# Patient Record
Sex: Male | Born: 1980 | Race: White | Hispanic: No | Smoking: Current every day smoker
Health system: Southern US, Community
[De-identification: ages and names within clinical notes are randomized; demographics above are authoritative.]

## PROBLEM LIST (undated history)

## (undated) DIAGNOSIS — R51 Headache: Secondary | ICD-10-CM

## (undated) DIAGNOSIS — K219 Gastro-esophageal reflux disease without esophagitis: Secondary | ICD-10-CM

## (undated) DIAGNOSIS — I2510 Atherosclerotic heart disease of native coronary artery without angina pectoris: Secondary | ICD-10-CM

## (undated) DIAGNOSIS — I209 Angina pectoris, unspecified: Secondary | ICD-10-CM

## (undated) DIAGNOSIS — M1990 Unspecified osteoarthritis, unspecified site: Secondary | ICD-10-CM

## (undated) DIAGNOSIS — I219 Acute myocardial infarction, unspecified: Secondary | ICD-10-CM

## (undated) DIAGNOSIS — G40909 Epilepsy, unspecified, not intractable, without status epilepticus: Secondary | ICD-10-CM

## (undated) DIAGNOSIS — I10 Essential (primary) hypertension: Secondary | ICD-10-CM

## (undated) DIAGNOSIS — R519 Headache, unspecified: Secondary | ICD-10-CM

## (undated) DIAGNOSIS — J449 Chronic obstructive pulmonary disease, unspecified: Secondary | ICD-10-CM

## (undated) DIAGNOSIS — K029 Dental caries, unspecified: Secondary | ICD-10-CM

## (undated) DIAGNOSIS — E119 Type 2 diabetes mellitus without complications: Secondary | ICD-10-CM

## (undated) DIAGNOSIS — R569 Unspecified convulsions: Secondary | ICD-10-CM

## (undated) HISTORY — PX: CHOLECYSTECTOMY: SHX55

## (undated) HISTORY — PX: FACIAL RECONSTRUCTION SURGERY: SHX631

## (undated) HISTORY — PX: EYE SURGERY: SHX253

---

## 2000-12-18 ENCOUNTER — Emergency Department (HOSPITAL_COMMUNITY): Admission: EM | Admit: 2000-12-18 | Discharge: 2000-12-18 | Payer: Self-pay | Admitting: *Deleted

## 2000-12-18 ENCOUNTER — Encounter: Payer: Self-pay | Admitting: *Deleted

## 2000-12-22 ENCOUNTER — Emergency Department (HOSPITAL_COMMUNITY): Admission: EM | Admit: 2000-12-22 | Discharge: 2000-12-23 | Payer: Self-pay | Admitting: *Deleted

## 2001-03-29 ENCOUNTER — Emergency Department (HOSPITAL_COMMUNITY): Admission: EM | Admit: 2001-03-29 | Discharge: 2001-03-29 | Payer: Self-pay | Admitting: *Deleted

## 2001-03-29 ENCOUNTER — Encounter: Payer: Self-pay | Admitting: *Deleted

## 2001-05-14 ENCOUNTER — Emergency Department (HOSPITAL_COMMUNITY): Admission: EM | Admit: 2001-05-14 | Discharge: 2001-05-14 | Payer: Self-pay | Admitting: *Deleted

## 2002-02-08 ENCOUNTER — Emergency Department (HOSPITAL_COMMUNITY): Admission: EM | Admit: 2002-02-08 | Discharge: 2002-02-08 | Payer: Self-pay | Admitting: *Deleted

## 2002-05-12 ENCOUNTER — Emergency Department (HOSPITAL_COMMUNITY): Admission: EM | Admit: 2002-05-12 | Discharge: 2002-05-12 | Payer: Self-pay | Admitting: *Deleted

## 2002-05-14 ENCOUNTER — Emergency Department (HOSPITAL_COMMUNITY): Admission: EM | Admit: 2002-05-14 | Discharge: 2002-05-14 | Payer: Self-pay | Admitting: Emergency Medicine

## 2002-05-15 ENCOUNTER — Emergency Department (HOSPITAL_COMMUNITY): Admission: EM | Admit: 2002-05-15 | Discharge: 2002-05-15 | Payer: Self-pay | Admitting: *Deleted

## 2003-06-19 ENCOUNTER — Emergency Department (HOSPITAL_COMMUNITY): Admission: EM | Admit: 2003-06-19 | Discharge: 2003-06-20 | Payer: Self-pay | Admitting: Emergency Medicine

## 2003-07-12 ENCOUNTER — Emergency Department (HOSPITAL_COMMUNITY): Admission: EM | Admit: 2003-07-12 | Discharge: 2003-07-13 | Payer: Self-pay | Admitting: Emergency Medicine

## 2003-07-23 ENCOUNTER — Emergency Department (HOSPITAL_COMMUNITY): Admission: EM | Admit: 2003-07-23 | Discharge: 2003-07-23 | Payer: Self-pay | Admitting: Emergency Medicine

## 2003-07-24 ENCOUNTER — Emergency Department (HOSPITAL_COMMUNITY): Admission: EM | Admit: 2003-07-24 | Discharge: 2003-07-24 | Payer: Self-pay | Admitting: Emergency Medicine

## 2003-07-25 ENCOUNTER — Ambulatory Visit (HOSPITAL_COMMUNITY): Admission: RE | Admit: 2003-07-25 | Discharge: 2003-07-25 | Payer: Self-pay | Admitting: Emergency Medicine

## 2003-07-25 ENCOUNTER — Emergency Department (HOSPITAL_COMMUNITY): Admission: EM | Admit: 2003-07-25 | Discharge: 2003-07-25 | Payer: Self-pay | Admitting: Emergency Medicine

## 2003-07-26 ENCOUNTER — Inpatient Hospital Stay (HOSPITAL_COMMUNITY): Admission: AD | Admit: 2003-07-26 | Discharge: 2003-07-29 | Payer: Self-pay | Admitting: General Surgery

## 2003-08-03 ENCOUNTER — Emergency Department (HOSPITAL_COMMUNITY): Admission: EM | Admit: 2003-08-03 | Discharge: 2003-08-03 | Payer: Self-pay | Admitting: *Deleted

## 2003-08-10 ENCOUNTER — Emergency Department (HOSPITAL_COMMUNITY): Admission: EM | Admit: 2003-08-10 | Discharge: 2003-08-11 | Payer: Self-pay

## 2003-08-23 ENCOUNTER — Emergency Department (HOSPITAL_COMMUNITY): Admission: EM | Admit: 2003-08-23 | Discharge: 2003-08-23 | Payer: Self-pay | Admitting: *Deleted

## 2004-09-11 ENCOUNTER — Emergency Department (HOSPITAL_COMMUNITY): Admission: EM | Admit: 2004-09-11 | Discharge: 2004-09-12 | Payer: Self-pay | Admitting: Emergency Medicine

## 2004-09-16 ENCOUNTER — Emergency Department (HOSPITAL_COMMUNITY): Admission: EM | Admit: 2004-09-16 | Discharge: 2004-09-16 | Payer: Self-pay | Admitting: Emergency Medicine

## 2004-11-03 ENCOUNTER — Emergency Department (HOSPITAL_COMMUNITY): Admission: EM | Admit: 2004-11-03 | Discharge: 2004-11-03 | Payer: Self-pay | Admitting: Emergency Medicine

## 2004-11-13 ENCOUNTER — Emergency Department (HOSPITAL_COMMUNITY): Admission: EM | Admit: 2004-11-13 | Discharge: 2004-11-13 | Payer: Self-pay | Admitting: Emergency Medicine

## 2004-11-20 ENCOUNTER — Emergency Department (HOSPITAL_COMMUNITY): Admission: EM | Admit: 2004-11-20 | Discharge: 2004-11-20 | Payer: Self-pay | Admitting: Emergency Medicine

## 2004-12-02 ENCOUNTER — Emergency Department (HOSPITAL_COMMUNITY): Admission: EM | Admit: 2004-12-02 | Discharge: 2004-12-03 | Payer: Self-pay | Admitting: *Deleted

## 2005-01-17 ENCOUNTER — Emergency Department (HOSPITAL_COMMUNITY): Admission: EM | Admit: 2005-01-17 | Discharge: 2005-01-17 | Payer: Self-pay | Admitting: Emergency Medicine

## 2005-04-20 ENCOUNTER — Emergency Department (HOSPITAL_COMMUNITY): Admission: EM | Admit: 2005-04-20 | Discharge: 2005-04-20 | Payer: Self-pay | Admitting: *Deleted

## 2005-09-06 ENCOUNTER — Emergency Department (HOSPITAL_COMMUNITY): Admission: EM | Admit: 2005-09-06 | Discharge: 2005-09-06 | Payer: Self-pay | Admitting: Emergency Medicine

## 2006-02-02 ENCOUNTER — Emergency Department (HOSPITAL_COMMUNITY): Admission: EM | Admit: 2006-02-02 | Discharge: 2006-02-02 | Payer: Self-pay | Admitting: Emergency Medicine

## 2006-03-11 ENCOUNTER — Emergency Department (HOSPITAL_COMMUNITY): Admission: EM | Admit: 2006-03-11 | Discharge: 2006-03-11 | Payer: Self-pay | Admitting: Emergency Medicine

## 2006-04-10 ENCOUNTER — Emergency Department (HOSPITAL_COMMUNITY): Admission: EM | Admit: 2006-04-10 | Discharge: 2006-04-10 | Payer: Self-pay | Admitting: Emergency Medicine

## 2006-04-17 ENCOUNTER — Emergency Department (HOSPITAL_COMMUNITY): Admission: EM | Admit: 2006-04-17 | Discharge: 2006-04-17 | Payer: Self-pay | Admitting: Emergency Medicine

## 2006-06-27 ENCOUNTER — Emergency Department (HOSPITAL_COMMUNITY): Admission: EM | Admit: 2006-06-27 | Discharge: 2006-06-27 | Payer: Self-pay | Admitting: Emergency Medicine

## 2006-12-16 ENCOUNTER — Emergency Department (HOSPITAL_COMMUNITY): Admission: EM | Admit: 2006-12-16 | Discharge: 2006-12-16 | Payer: Self-pay | Admitting: Emergency Medicine

## 2007-07-02 ENCOUNTER — Emergency Department (HOSPITAL_COMMUNITY): Admission: EM | Admit: 2007-07-02 | Discharge: 2007-07-02 | Payer: Self-pay | Admitting: Emergency Medicine

## 2007-07-04 ENCOUNTER — Emergency Department (HOSPITAL_COMMUNITY): Admission: EM | Admit: 2007-07-04 | Discharge: 2007-07-04 | Payer: Self-pay | Admitting: Emergency Medicine

## 2008-04-04 ENCOUNTER — Ambulatory Visit (HOSPITAL_COMMUNITY): Admission: RE | Admit: 2008-04-04 | Discharge: 2008-04-04 | Payer: Self-pay | Admitting: Ophthalmology

## 2008-04-28 ENCOUNTER — Emergency Department (HOSPITAL_COMMUNITY): Admission: EM | Admit: 2008-04-28 | Discharge: 2008-04-28 | Payer: Self-pay | Admitting: Emergency Medicine

## 2008-10-07 ENCOUNTER — Emergency Department (HOSPITAL_COMMUNITY): Admission: EM | Admit: 2008-10-07 | Discharge: 2008-10-07 | Payer: Self-pay | Admitting: Emergency Medicine

## 2008-12-26 ENCOUNTER — Ambulatory Visit (HOSPITAL_COMMUNITY): Admission: RE | Admit: 2008-12-26 | Discharge: 2008-12-26 | Payer: Self-pay | Admitting: Ophthalmology

## 2009-12-14 ENCOUNTER — Emergency Department (HOSPITAL_COMMUNITY): Admission: EM | Admit: 2009-12-14 | Discharge: 2009-12-14 | Payer: Self-pay | Admitting: Emergency Medicine

## 2010-01-04 ENCOUNTER — Emergency Department (HOSPITAL_COMMUNITY): Admission: EM | Admit: 2010-01-04 | Discharge: 2010-01-04 | Payer: Self-pay | Admitting: Emergency Medicine

## 2010-03-23 ENCOUNTER — Emergency Department (HOSPITAL_COMMUNITY): Admission: EM | Admit: 2010-03-23 | Discharge: 2010-03-23 | Payer: Self-pay | Admitting: Emergency Medicine

## 2010-04-10 ENCOUNTER — Emergency Department (HOSPITAL_COMMUNITY)
Admission: EM | Admit: 2010-04-10 | Discharge: 2010-04-11 | Payer: Self-pay | Source: Home / Self Care | Admitting: Emergency Medicine

## 2010-04-25 ENCOUNTER — Emergency Department (HOSPITAL_COMMUNITY)
Admission: EM | Admit: 2010-04-25 | Discharge: 2010-04-26 | Payer: Self-pay | Source: Home / Self Care | Admitting: Emergency Medicine

## 2010-05-24 ENCOUNTER — Emergency Department (HOSPITAL_COMMUNITY)
Admission: EM | Admit: 2010-05-24 | Discharge: 2010-05-25 | Payer: Self-pay | Source: Home / Self Care | Admitting: Emergency Medicine

## 2010-07-12 LAB — VALPROIC ACID LEVEL: Valproic Acid Lvl: <10 ug/mL — ABNORMAL LOW (ref 50.0–100.0)

## 2010-07-21 ENCOUNTER — Emergency Department (HOSPITAL_COMMUNITY): Payer: Medicare Other

## 2010-07-21 ENCOUNTER — Emergency Department (HOSPITAL_COMMUNITY)
Admission: EM | Admit: 2010-07-21 | Discharge: 2010-07-21 | Disposition: A | Discharge status: Home / Self Care | Payer: Medicare Other | Attending: Emergency Medicine | Admitting: Emergency Medicine

## 2010-07-21 DIAGNOSIS — R61 Generalized hyperhidrosis: Secondary | ICD-10-CM | POA: Insufficient documentation

## 2010-07-21 DIAGNOSIS — R079 Chest pain, unspecified: Secondary | ICD-10-CM | POA: Insufficient documentation

## 2010-07-21 LAB — CBC
HCT: 46.5 % (ref 39.0–52.0)
Hemoglobin: 16.1 g/dL (ref 13.0–17.0)
MCH: 31.1 pg (ref 26.0–34.0)
MCV: 89.8 fL (ref 78.0–100.0)
RBC: 5.18 MIL/uL (ref 4.22–5.81)

## 2010-07-21 LAB — POCT CARDIAC MARKERS: Troponin i, poc: <0.05 ng/mL (ref 0.00–0.09)

## 2010-07-21 LAB — BASIC METABOLIC PANEL
Chloride: 104 mEq/L (ref 96–112)
Creatinine, Ser: 0.9 mg/dL (ref 0.4–1.5)
GFR calc Af Amer: >60 mL/min (ref >60)
Potassium: 3.6 mEq/L (ref 3.5–5.1)
Sodium: 135 mEq/L (ref 135–145)

## 2010-07-21 LAB — DIFFERENTIAL
Lymphocytes Relative: 29 % (ref 12–46)
Lymphs Abs: 3.5 10*3/uL (ref 0.7–4.0)
Monocytes Absolute: 0.7 10*3/uL (ref 0.1–1.0)
Monocytes Relative: 6 % (ref 3–12)
Neutro Abs: 7.7 10*3/uL (ref 1.7–7.7)
Neutrophils Relative %: 63 % (ref 43–77)
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
## 2010-09-14 NOTE — Discharge Summary (Signed)
NAME:  Chad Hampton, Chad Hampton NO.:  [id number]   MEDICAL RECORD NO.:  [id number]                   PATIENT TYPE:  INP   LOCATION:  A337                                 FACILITY:  [facility]   PHYSICIAN:  Barbaraann Barthel, M.D.              DATE OF BIRTH:  1980/09/28   DATE OF ADMISSION:  07/26/2003  DATE OF DISCHARGE:  07/29/2003                                 DISCHARGE SUMMARY   DIAGNOSES:  1. Cholecystitis.  2. Cholelithiasis.   PROCEDURE:  On July 28, 2003, laparoscopic cholecystectomy.   NOTE:  This is a 30 year old male who had recurrent episodes of right upper  quadrant pain, nausea, and vomiting.  This had been going on for quite some  time.  He had a sonogram which revealed cholecystitis with some possible  thickening of the gallbladder.  He was placed on a restrictive diet with  plans for elective surgery.  He became symptomatic necessitating his  admission.  We admitted him on July 26, 2003, and then operated on him, on  July 28, 2003, after IV antibiotics and hydration were initiated.  He  underwent a laparoscopic cholecystectomy uneventfully.  His postoperative  period was also uneventful, and his wound was clean at the time of  discharge.  He was voiding well, and he had some incisional discomfort but  otherwise doing quite well.   SECONDARY DIAGNOSIS:  Seizure disorder.  We will have him followup with Dr.  Gerilyn Pilgrim, who unfortunately is out of town during the time of his surgery.  We will resume his anti-seizure medicines perioperatively and there was no  problem with this.   LABORATORY DATA:  Pathology report is not on the chart at the time of this  dictation.   Cholecystitis and cholelithiasis were seen intraoperatively.   His white count, on July 26, 2003, was 9.9 with an H&H of 16.9 and 48.6.  His liver function studies were all within normal limits with a normal  bilirubin, and his amylase was not elevated.  Postoperatively, his  H&H  remained stable, and his electrolytes also were within normal limits.   DISCHARGE INSTRUCTIONS:  1. He is discharged on a full liquid and soft diet.  2. He is disabled, so he is excused from any work increase.  3. He is told to increase his activity as tolerated.  He is permitted to     shower, walk up and down the stairs.  4. He is to refrain from any heavy lifting, or driving, or sexual activity.  5. He is told to clean his wounds with alcohol three times a day.  6. Resume seizure medicines as preoperatively.  7. He is given a prescription for Tylenol #3, one tab every four to six     hours for pain.  8. He is told to take no aspirin products.  9. Take Milk of Magnesia 1 ounce as needed for his  bowels.  10.      We have made arrangements for followup in this office, and we will     follow him perioperatively.  Again, he is encouraged to followup with Dr.     Gerilyn Pilgrim as well.     ___________________________________________                                         Barbaraann Barthel, M.D.   WB/MEDQ  D:  07/29/2003  T:  07/29/2003  Job:  [id number]   cc:   Darleen Crocker A. Gerilyn Pilgrim, M.D.  [address], [place]  [place]  [place] [address]  Fax: [phone]

## 2010-09-14 NOTE — Op Note (Signed)
NAME:  Chad Hampton, Chad Hampton NO.:  [id number]   MEDICAL RECORD NO.:  [id number]                   PATIENT TYPE:  INP   LOCATION:  A337                                 FACILITY:  [facility]   PHYSICIAN:  Barbaraann Barthel, M.D.              DATE OF BIRTH:  07-24-1980   DATE OF PROCEDURE:  07/28/2003  DATE OF DISCHARGE:                                 OPERATIVE REPORT   SURGEON:  Barbaraann Barthel, M.D.   PREOPERATIVE DIAGNOSIS:  Cholecystitis, cholelithiasis.   POSTOPERATIVE DIAGNOSIS:  Cholecystitis, cholelithiasis.   PROCEDURE:  Laparoscopic cholecystectomy.   SPECIMENS:  Gallbladder with stones.   INDICATIONS FOR PROCEDURE:  This is a 30 year old white male who was  admitted to the hospital with acute cholecystitis.  We had originally  planned to do him on an elective basis; however, he became symptomatic.  We  admitted him and initiated antibiotics and hydration, and he was taken to  surgery today, 07/28/2003.   We had discussed the surgery with him in detail, including the complications  not limited to but including bleeding, infection, damage to bile ducts,  perforation or organs, and transitory diarrhea.  Informed consent was  obtained.   GROSS OPERATIVE FINDINGS:  The patient had an extremely fatty infiltrated  gallbladder that appeared somewhat thickened.  Most of the acute  inflammation had subsided, however.  Small cystic duct which was not  cannulated.  There were no other abnormalities noted in the right upper  quadrant.   TECHNIQUE:  The patient was placed in the supine position after the adequate  administration of general anesthesia via endotracheal intubation.  The Foley  catheter was aseptically inserted.  He was prepped with Betadine solution  and draped in the usual manner.   With the patient in Trendelenburg, a periumbilical incision was carried out  over the superior aspect of the umbilicus.  The fascia was grasped with a  sharp towel  clip, and a Veress needle was inserted and confirmed in position  with the saline drop test.  We then placed an 11-mm cannula using the  Stryker camera Visiport technique, and the 11-mm cannula was placed in the  umbilicus.  Under direct vision, three other cannulas were placed, an 11-mm  cannula in the epigastrium and two 5-mm cannulas in the right upper quadrant  laterally.   The gallbladder was then grasped.  It adhesions were taken down.  We went  through the fatty infiltration of the distal portion of Hartmann's pouch  carefully to see the connection of the cystic duct to the gallbladder.  This  was clearly visualized, triply silver clipped on the side of the common bile  duct and singly silver clipped close to the gallbladder.  Likewise, the  cystic artery was identified, triply silver clipped, and divided.  The  gallbladder was then removed uneventfully using the hook cautery device from  the liver bed.  The gallbladder was then removed using an EndoCatch sac  device.  This was removed.  The right upper quadrant was then irrigated.  I  checked for hemostasis.  This was cauterized with the hook cautery device,  and I elected to leave a piece of Surgicel in the liver bed.  After checking  for hemostasis and irrigation, the abdomen was then desufflated.  The 11-mm  cannula sites in the epigastrium and the umbilicus were closed with 0  Polysorb, and I used approximately 8 cc of 0.5% Sensorcaine for the port  sites to help with postoperative discomfort.  Prior to closure, all sponge,  needle, and instrument counts were found to be correct.  Estimated blood  loss was minimal.  No drains were placed.  The patient received  approximately 1200 cc of crystalloid intraoperatively.      ___________________________________________                                            Barbaraann Barthel, M.D.   WB/MEDQ  D:  07/28/2003  T:  07/28/2003  Job:  [id number]   cc:   Kofi A. Gerilyn Pilgrim, M.D.  [address], [place]  [place]  [place] [address]  Fax: [phone]

## 2010-09-15 ENCOUNTER — Emergency Department (HOSPITAL_COMMUNITY)
Admission: EM | Admit: 2010-09-15 | Discharge: 2010-09-15 | Disposition: A | Discharge status: Home / Self Care | Payer: Medicare Other | Attending: Emergency Medicine | Admitting: Emergency Medicine

## 2010-09-15 DIAGNOSIS — R569 Unspecified convulsions: Secondary | ICD-10-CM | POA: Insufficient documentation

## 2010-09-28 ENCOUNTER — Emergency Department (HOSPITAL_COMMUNITY): Payer: Medicare Other

## 2010-09-28 ENCOUNTER — Emergency Department (HOSPITAL_COMMUNITY)
Admission: EM | Admit: 2010-09-28 | Discharge: 2010-09-28 | Disposition: A | Discharge status: Home / Self Care | Payer: Medicare Other | Attending: Emergency Medicine | Admitting: Emergency Medicine

## 2010-09-28 DIAGNOSIS — J40 Bronchitis, not specified as acute or chronic: Secondary | ICD-10-CM | POA: Insufficient documentation

## 2010-09-28 DIAGNOSIS — F17200 Nicotine dependence, unspecified, uncomplicated: Secondary | ICD-10-CM | POA: Insufficient documentation

## 2010-09-28 DIAGNOSIS — R071 Chest pain on breathing: Secondary | ICD-10-CM | POA: Insufficient documentation

## 2010-09-28 DIAGNOSIS — J45909 Unspecified asthma, uncomplicated: Secondary | ICD-10-CM | POA: Insufficient documentation

## 2010-09-28 DIAGNOSIS — Z8739 Personal history of other diseases of the musculoskeletal system and connective tissue: Secondary | ICD-10-CM | POA: Insufficient documentation

## 2010-10-08 ENCOUNTER — Emergency Department (HOSPITAL_COMMUNITY)
Admission: EM | Admit: 2010-10-08 | Discharge: 2010-10-09 | Disposition: A | Discharge status: Home / Self Care | Payer: Medicare Other | Attending: Emergency Medicine | Admitting: Emergency Medicine

## 2010-10-08 ENCOUNTER — Emergency Department (HOSPITAL_COMMUNITY): Payer: Medicare Other

## 2010-10-08 DIAGNOSIS — Y92009 Unspecified place in unspecified non-institutional (private) residence as the place of occurrence of the external cause: Secondary | ICD-10-CM | POA: Insufficient documentation

## 2010-10-08 DIAGNOSIS — J45909 Unspecified asthma, uncomplicated: Secondary | ICD-10-CM | POA: Insufficient documentation

## 2010-10-08 DIAGNOSIS — W010XXA Fall on same level from slipping, tripping and stumbling without subsequent striking against object, initial encounter: Secondary | ICD-10-CM | POA: Insufficient documentation

## 2010-10-08 DIAGNOSIS — S63509A Unspecified sprain of unspecified wrist, initial encounter: Secondary | ICD-10-CM | POA: Insufficient documentation

## 2010-10-17 ENCOUNTER — Other Ambulatory Visit (HOSPITAL_COMMUNITY): Payer: Self-pay | Admitting: Orthopaedic Surgery

## 2010-10-17 ENCOUNTER — Ambulatory Visit (HOSPITAL_COMMUNITY)
Admission: RE | Admit: 2010-10-17 | Discharge: 2010-10-17 | Disposition: A | Discharge status: Home / Self Care | Payer: Medicare Other | Source: Ambulatory Visit | Attending: Orthopaedic Surgery | Admitting: Orthopaedic Surgery

## 2010-10-17 DIAGNOSIS — M899 Disorder of bone, unspecified: Secondary | ICD-10-CM | POA: Insufficient documentation

## 2010-10-17 DIAGNOSIS — M25539 Pain in unspecified wrist: Secondary | ICD-10-CM

## 2010-10-17 DIAGNOSIS — R52 Pain, unspecified: Secondary | ICD-10-CM

## 2010-10-23 ENCOUNTER — Emergency Department (HOSPITAL_COMMUNITY): Payer: Medicare Other

## 2010-10-23 ENCOUNTER — Emergency Department (HOSPITAL_COMMUNITY)
Admission: EM | Admit: 2010-10-23 | Discharge: 2010-10-23 | Disposition: A | Discharge status: Home / Self Care | Payer: Medicare Other | Attending: Emergency Medicine | Admitting: Emergency Medicine

## 2010-10-23 DIAGNOSIS — J45909 Unspecified asthma, uncomplicated: Secondary | ICD-10-CM | POA: Insufficient documentation

## 2010-10-23 DIAGNOSIS — S60219A Contusion of unspecified wrist, initial encounter: Secondary | ICD-10-CM | POA: Insufficient documentation

## 2010-10-23 DIAGNOSIS — G40909 Epilepsy, unspecified, not intractable, without status epilepticus: Secondary | ICD-10-CM | POA: Insufficient documentation

## 2010-10-23 DIAGNOSIS — M129 Arthropathy, unspecified: Secondary | ICD-10-CM | POA: Insufficient documentation

## 2010-10-23 DIAGNOSIS — W208XXA Other cause of strike by thrown, projected or falling object, initial encounter: Secondary | ICD-10-CM | POA: Insufficient documentation

## 2010-11-18 ENCOUNTER — Encounter: Payer: Self-pay | Admitting: *Deleted

## 2010-11-18 DIAGNOSIS — F17200 Nicotine dependence, unspecified, uncomplicated: Secondary | ICD-10-CM | POA: Insufficient documentation

## 2010-11-18 DIAGNOSIS — R51 Headache: Secondary | ICD-10-CM | POA: Insufficient documentation

## 2010-11-18 DIAGNOSIS — R569 Unspecified convulsions: Secondary | ICD-10-CM | POA: Insufficient documentation

## 2010-11-18 DIAGNOSIS — S0990XA Unspecified injury of head, initial encounter: Secondary | ICD-10-CM | POA: Insufficient documentation
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
## 2010-11-19 ENCOUNTER — Emergency Department (HOSPITAL_COMMUNITY)
Admission: EM | Admit: 2010-11-19 | Discharge: 2010-11-19 | Disposition: A | Discharge status: Home / Self Care | Payer: Medicare Other | Attending: Emergency Medicine | Admitting: Emergency Medicine

## 2010-11-19 ENCOUNTER — Encounter (HOSPITAL_COMMUNITY): Payer: Self-pay

## 2010-11-19 ENCOUNTER — Encounter (HOSPITAL_COMMUNITY): Payer: Self-pay | Admitting: *Deleted

## 2010-11-19 ENCOUNTER — Emergency Department (HOSPITAL_COMMUNITY): Payer: Medicare Other

## 2010-11-19 ENCOUNTER — Emergency Department (HOSPITAL_COMMUNITY)
Admission: EM | Admit: 2010-11-19 | Discharge: 2010-11-20 | Disposition: A | Discharge status: Home / Self Care | Payer: Medicare Other | Attending: Emergency Medicine | Admitting: Emergency Medicine

## 2010-11-19 DIAGNOSIS — S0990XA Unspecified injury of head, initial encounter: Secondary | ICD-10-CM

## 2010-11-19 DIAGNOSIS — G40909 Epilepsy, unspecified, not intractable, without status epilepticus: Secondary | ICD-10-CM | POA: Insufficient documentation

## 2010-11-19 DIAGNOSIS — F17200 Nicotine dependence, unspecified, uncomplicated: Secondary | ICD-10-CM | POA: Insufficient documentation

## 2010-11-19 DIAGNOSIS — J45909 Unspecified asthma, uncomplicated: Secondary | ICD-10-CM | POA: Insufficient documentation

## 2010-11-19 DIAGNOSIS — R6884 Jaw pain: Secondary | ICD-10-CM | POA: Insufficient documentation

## 2010-11-19 HISTORY — DX: Epilepsy, unspecified, not intractable, without status epilepticus: G40.909

## 2010-11-19 MED ORDER — HYDROCODONE-ACETAMINOPHEN 5-325 MG PO TABS
2.0000 | ORAL_TABLET | Freq: Once | ORAL | Status: AC
  Administered 2010-11-19: 2 via ORAL
  Filled 2010-11-19: qty 2
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
## 2010-11-19 NOTE — ED Notes (Signed)
Seizure pads in place due to report of recent seizure activity
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
## 2010-11-20 MED ORDER — NAPROXEN 250 MG PO TABS
500.0000 mg | ORAL_TABLET | Freq: Once | ORAL | Status: AC
  Administered 2010-11-20: 500 mg via ORAL
  Filled 2010-11-20: qty 2

## 2010-11-20 MED ORDER — KETOROLAC TROMETHAMINE 60 MG/2ML IM SOLN
60.0000 mg | Freq: Once | INTRAMUSCULAR | Status: AC
  Administered 2010-11-20: 60 mg via INTRAMUSCULAR
  Filled 2010-11-20: qty 2
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
## 2010-11-20 NOTE — ED Notes (Signed)
Pt states feels like jaw is popping in & out of place.

## 2011-01-21 LAB — COMPREHENSIVE METABOLIC PANEL
ALT: 34
AST: 19
CO2: 25
Chloride: 105
Creatinine, Ser: 0.86
GFR calc Af Amer: >60
GFR calc non Af Amer: >60
Total Bilirubin: 0.7

## 2011-01-21 LAB — DIFFERENTIAL
Basophils Absolute: 0.1
Eosinophils Absolute: 0.2
Eosinophils Relative: 2

## 2011-01-21 LAB — CBC
Hemoglobin: 16.7
MCV: 91.1
RBC: 5.23
WBC: 14.8 — ABNORMAL HIGH

## 2011-01-21 LAB — PHENYTOIN LEVEL, TOTAL: Phenytoin Lvl: <2.5 — ABNORMAL LOW

## 2011-02-01 LAB — CBC
MCHC: 33.5 g/dL (ref 30.0–36.0)
RDW: 13 % (ref 11.5–15.5)

## 2011-02-01 LAB — POCT I-STAT, CHEM 8
BUN: 10 mg/dL (ref 6–23)
Calcium, Ion: 1.05 mmol/L — ABNORMAL LOW (ref 1.12–1.32)
Chloride: 107 mEq/L (ref 96–112)
Creatinine, Ser: 0.9 mg/dL (ref 0.4–1.5)
TCO2: 21 mmol/L (ref 0–100)

## 2011-02-01 LAB — DIFFERENTIAL
Basophils Absolute: 0.1 10*3/uL (ref 0.0–0.1)
Basophils Relative: 1 % (ref 0–1)
Neutro Abs: 10.4 10*3/uL — ABNORMAL HIGH (ref 1.7–7.7)
Neutrophils Relative %: 72 % (ref 43–77)

## 2011-02-01 LAB — VALPROIC ACID LEVEL: Valproic Acid Lvl: <10 ug/mL — ABNORMAL LOW (ref 50.0–100.0)

## 2011-02-08 ENCOUNTER — Encounter (HOSPITAL_COMMUNITY): Payer: Self-pay | Admitting: Oncology

## 2011-02-08 ENCOUNTER — Emergency Department (HOSPITAL_COMMUNITY): Payer: Medicare Other

## 2011-02-08 ENCOUNTER — Emergency Department (HOSPITAL_COMMUNITY)
Admission: EM | Admit: 2011-02-08 | Discharge: 2011-02-08 | Disposition: A | Discharge status: Home / Self Care | Payer: Medicare Other | Attending: Emergency Medicine | Admitting: Emergency Medicine

## 2011-02-08 DIAGNOSIS — S60229A Contusion of unspecified hand, initial encounter: Secondary | ICD-10-CM | POA: Insufficient documentation

## 2011-02-08 DIAGNOSIS — G40909 Epilepsy, unspecified, not intractable, without status epilepticus: Secondary | ICD-10-CM | POA: Insufficient documentation

## 2011-02-08 DIAGNOSIS — S60221A Contusion of right hand, initial encounter: Secondary | ICD-10-CM

## 2011-02-08 DIAGNOSIS — J45909 Unspecified asthma, uncomplicated: Secondary | ICD-10-CM | POA: Insufficient documentation

## 2011-02-08 DIAGNOSIS — X838XXA Intentional self-harm by other specified means, initial encounter: Secondary | ICD-10-CM | POA: Insufficient documentation

## 2011-02-08 DIAGNOSIS — F17200 Nicotine dependence, unspecified, uncomplicated: Secondary | ICD-10-CM | POA: Insufficient documentation

## 2011-02-08 MED ORDER — HYDROCODONE-ACETAMINOPHEN 5-325 MG PO TABS: 1.0000 | ORAL_TABLET | ORAL | Status: AC | PRN
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
## 2011-02-09 NOTE — ED Provider Notes (Signed)
Medical screening examination/treatment/procedure(s) were performed by non-physician practitioner and as supervising physician I was immediately available for consultation/collaboration.  Juliet Rude. Rubin Payor, MD 02/09/11 1453

## 2011-05-17 DIAGNOSIS — G459 Transient cerebral ischemic attack, unspecified: Secondary | ICD-10-CM | POA: Diagnosis not present

## 2011-05-17 DIAGNOSIS — I10 Essential (primary) hypertension: Secondary | ICD-10-CM | POA: Diagnosis not present

## 2011-05-17 DIAGNOSIS — J45909 Unspecified asthma, uncomplicated: Secondary | ICD-10-CM | POA: Diagnosis not present

## 2011-05-17 DIAGNOSIS — G40909 Epilepsy, unspecified, not intractable, without status epilepticus: Secondary | ICD-10-CM | POA: Diagnosis not present

## 2011-05-17 DIAGNOSIS — F17200 Nicotine dependence, unspecified, uncomplicated: Secondary | ICD-10-CM | POA: Diagnosis not present

## 2011-05-17 DIAGNOSIS — Z79899 Other long term (current) drug therapy: Secondary | ICD-10-CM | POA: Diagnosis not present

## 2011-05-17 DIAGNOSIS — R079 Chest pain, unspecified: Secondary | ICD-10-CM | POA: Diagnosis not present

## 2011-05-18 DIAGNOSIS — J45909 Unspecified asthma, uncomplicated: Secondary | ICD-10-CM | POA: Diagnosis not present

## 2011-05-18 DIAGNOSIS — I10 Essential (primary) hypertension: Secondary | ICD-10-CM | POA: Diagnosis not present

## 2011-05-18 DIAGNOSIS — R079 Chest pain, unspecified: Secondary | ICD-10-CM | POA: Diagnosis not present

## 2011-05-18 DIAGNOSIS — G459 Transient cerebral ischemic attack, unspecified: Secondary | ICD-10-CM | POA: Diagnosis not present

## 2011-05-18 DIAGNOSIS — F17200 Nicotine dependence, unspecified, uncomplicated: Secondary | ICD-10-CM | POA: Diagnosis not present

## 2011-05-18 DIAGNOSIS — G40909 Epilepsy, unspecified, not intractable, without status epilepticus: Secondary | ICD-10-CM | POA: Diagnosis not present

## 2011-05-22 DIAGNOSIS — R0789 Other chest pain: Secondary | ICD-10-CM | POA: Diagnosis not present

## 2011-05-22 DIAGNOSIS — Z7982 Long term (current) use of aspirin: Secondary | ICD-10-CM | POA: Diagnosis not present

## 2011-05-22 DIAGNOSIS — F17200 Nicotine dependence, unspecified, uncomplicated: Secondary | ICD-10-CM | POA: Diagnosis not present

## 2011-05-22 DIAGNOSIS — J45909 Unspecified asthma, uncomplicated: Secondary | ICD-10-CM | POA: Diagnosis not present

## 2011-05-22 DIAGNOSIS — G459 Transient cerebral ischemic attack, unspecified: Secondary | ICD-10-CM | POA: Diagnosis not present

## 2011-05-22 DIAGNOSIS — I10 Essential (primary) hypertension: Secondary | ICD-10-CM | POA: Diagnosis not present

## 2011-05-22 DIAGNOSIS — R209 Unspecified disturbances of skin sensation: Secondary | ICD-10-CM | POA: Diagnosis not present

## 2011-05-22 DIAGNOSIS — G40909 Epilepsy, unspecified, not intractable, without status epilepticus: Secondary | ICD-10-CM | POA: Diagnosis not present

## 2011-05-22 DIAGNOSIS — Z8249 Family history of ischemic heart disease and other diseases of the circulatory system: Secondary | ICD-10-CM | POA: Diagnosis not present

## 2011-05-22 DIAGNOSIS — Z79899 Other long term (current) drug therapy: Secondary | ICD-10-CM | POA: Diagnosis not present

## 2011-05-22 DIAGNOSIS — R079 Chest pain, unspecified: Secondary | ICD-10-CM | POA: Diagnosis not present

## 2011-05-23 DIAGNOSIS — G459 Transient cerebral ischemic attack, unspecified: Secondary | ICD-10-CM | POA: Diagnosis not present

## 2011-05-23 DIAGNOSIS — G40909 Epilepsy, unspecified, not intractable, without status epilepticus: Secondary | ICD-10-CM | POA: Diagnosis not present

## 2011-05-23 DIAGNOSIS — R079 Chest pain, unspecified: Secondary | ICD-10-CM | POA: Diagnosis not present

## 2011-05-23 DIAGNOSIS — I10 Essential (primary) hypertension: Secondary | ICD-10-CM | POA: Diagnosis not present

## 2011-05-23 DIAGNOSIS — R0789 Other chest pain: Secondary | ICD-10-CM | POA: Diagnosis not present

## 2011-05-23 DIAGNOSIS — J45909 Unspecified asthma, uncomplicated: Secondary | ICD-10-CM | POA: Diagnosis not present

## 2011-05-24 DIAGNOSIS — R079 Chest pain, unspecified: Secondary | ICD-10-CM | POA: Diagnosis not present

## 2011-05-24 DIAGNOSIS — I10 Essential (primary) hypertension: Secondary | ICD-10-CM | POA: Diagnosis not present

## 2011-05-24 DIAGNOSIS — G40909 Epilepsy, unspecified, not intractable, without status epilepticus: Secondary | ICD-10-CM | POA: Diagnosis not present

## 2011-05-24 DIAGNOSIS — J45909 Unspecified asthma, uncomplicated: Secondary | ICD-10-CM | POA: Diagnosis not present

## 2011-05-24 DIAGNOSIS — G459 Transient cerebral ischemic attack, unspecified: Secondary | ICD-10-CM | POA: Diagnosis not present

## 2011-07-04 ENCOUNTER — Emergency Department (HOSPITAL_COMMUNITY): Payer: Medicare Other

## 2011-07-04 ENCOUNTER — Encounter (HOSPITAL_COMMUNITY): Payer: Self-pay | Admitting: *Deleted

## 2011-07-04 ENCOUNTER — Emergency Department (HOSPITAL_COMMUNITY)
Admission: EM | Admit: 2011-07-04 | Discharge: 2011-07-04 | Disposition: A | Discharge status: Home / Self Care | Payer: Medicare Other | Attending: Emergency Medicine | Admitting: Emergency Medicine

## 2011-07-04 ENCOUNTER — Other Ambulatory Visit: Payer: Self-pay

## 2011-07-04 DIAGNOSIS — R079 Chest pain, unspecified: Secondary | ICD-10-CM | POA: Insufficient documentation

## 2011-07-04 DIAGNOSIS — R110 Nausea: Secondary | ICD-10-CM | POA: Diagnosis not present

## 2011-07-04 DIAGNOSIS — Z7982 Long term (current) use of aspirin: Secondary | ICD-10-CM | POA: Insufficient documentation

## 2011-07-04 DIAGNOSIS — G40909 Epilepsy, unspecified, not intractable, without status epilepticus: Secondary | ICD-10-CM | POA: Diagnosis not present

## 2011-07-04 DIAGNOSIS — I10 Essential (primary) hypertension: Secondary | ICD-10-CM | POA: Insufficient documentation

## 2011-07-04 DIAGNOSIS — R0789 Other chest pain: Secondary | ICD-10-CM | POA: Diagnosis not present

## 2011-07-04 DIAGNOSIS — Z79899 Other long term (current) drug therapy: Secondary | ICD-10-CM | POA: Diagnosis not present

## 2011-07-04 DIAGNOSIS — R569 Unspecified convulsions: Secondary | ICD-10-CM | POA: Diagnosis not present

## 2011-07-04 DIAGNOSIS — J45909 Unspecified asthma, uncomplicated: Secondary | ICD-10-CM | POA: Insufficient documentation

## 2011-07-04 DIAGNOSIS — I252 Old myocardial infarction: Secondary | ICD-10-CM | POA: Diagnosis not present

## 2011-07-04 HISTORY — DX: Essential (primary) hypertension: I10

## 2011-07-04 LAB — CBC
HCT: 47.8 % (ref 39.0–52.0)
MCH: 31.1 pg (ref 26.0–34.0)
MCV: 88.4 fL (ref 78.0–100.0)
Platelets: 233 10*3/uL (ref 150–400)
RDW: 13 % (ref 11.5–15.5)

## 2011-07-04 LAB — BASIC METABOLIC PANEL
CO2: 24 mEq/L (ref 19–32)
Calcium: 9.3 mg/dL (ref 8.4–10.5)
Creatinine, Ser: 0.87 mg/dL (ref 0.50–1.35)
GFR calc non Af Amer: >90 mL/min (ref >90)
Glucose, Bld: 112 mg/dL — ABNORMAL HIGH (ref 70–99)
Sodium: 135 mEq/L (ref 135–145)

## 2011-07-04 LAB — DIFFERENTIAL
Basophils Absolute: 0.1 10*3/uL (ref 0.0–0.1)
Eosinophils Absolute: 0.2 10*3/uL (ref 0.0–0.7)
Eosinophils Relative: 2 % (ref 0–5)
Lymphocytes Relative: 36 % (ref 12–46)
Lymphs Abs: 4.3 10*3/uL — ABNORMAL HIGH (ref 0.7–4.0)
Monocytes Absolute: 0.7 10*3/uL (ref 0.1–1.0)

## 2011-07-04 LAB — POCT I-STAT TROPONIN I

## 2011-07-04 MED ORDER — KETOROLAC TROMETHAMINE 30 MG/ML IJ SOLN
30.0000 mg | Freq: Once | INTRAMUSCULAR | Status: AC
  Administered 2011-07-04: 30 mg via INTRAVENOUS
  Filled 2011-07-04: qty 1

## 2011-07-04 MED ORDER — SODIUM CHLORIDE 0.9 % IV SOLN
Freq: Once | INTRAVENOUS | Status: AC
  Administered 2011-07-04: 03:00:00 via INTRAVENOUS

## 2011-07-04 NOTE — ED Provider Notes (Signed)
History     CSN: [id number]  Arrival date & time 07/04/11  0219   First MD Initiated Contact with Patient 07/04/11 0256      Chief Complaint  Patient presents with  . Chest Pain    (Consider location/radiation/quality/duration/timing/severity/associated sxs/prior treatment) Patient is a 31 y.o. male presenting with chest pain. The history is provided by the patient.  Chest Pain Episode onset: earlier this afternoon. Chest pain occurs constantly. The chest pain is unchanged. The pain is associated with breathing. The severity of the pain is moderate. The quality of the pain is described as pressure-like. The pain does not radiate. Chest pain is worsened by certain positions and deep breathing. Primary symptoms include nausea. Pertinent negatives for primary symptoms include no fever, no cough, no palpitations and no vomiting. Treatments tried: no relief with ntg. Risk factors: htn, family history.     Past Medical History  Diagnosis Date  . Seizure disorder   . Asthma   . Hypertension     Past Surgical History  Procedure Date  . Cholecystectomy   . Eye surgery     History reviewed. No pertinent family history.  History  Substance Use Topics  . Smoking status: Current Everyday Smoker -- 0.5 packs/day    Types: Cigarettes  . Smokeless tobacco: Not on file  . Alcohol Use: No     last night 02/07/11 was the "first time in a while"      Review of Systems  Constitutional: Negative for fever.  Respiratory: Negative for cough.   Cardiovascular: Positive for chest pain. Negative for palpitations.  Gastrointestinal: Positive for nausea. Negative for vomiting.  All other systems reviewed and are negative.    Allergies  Morphine and related; Penicillins; Tramadol; and Ibuprofen  Home Medications   Current Outpatient Rx  Name Route Sig Dispense Refill  . VENTOLIN HFA IN Inhalation Inhale 2 puffs into the lungs as needed. SHORTNESS OF BREATH     . ASPIRIN 81 MG PO TABS  Oral Take 81 mg by mouth daily.    Marland Kitchen BENAZEPRIL-HYDROCHLOROTHIAZIDE 20-25 MG PO TABS Oral Take 1 tablet by mouth daily.    Marland Kitchen DIVALPROEX SODIUM ER 500 MG PO TB24 Oral Take 1,000 mg by mouth daily at 8 pm.      . NITROGLYCERIN 0.4 MG SL SUBL Sublingual Place 0.4 mg under the tongue every 5 (five) minutes as needed.    Marland Kitchen PHENYTOIN SODIUM EXTENDED 100 MG PO CAPS Oral Take 300 mg by mouth every morning.      . TRAZODONE HCL 300 MG PO TABS Oral Take 300 mg by mouth at bedtime.    . CYCLOBENZAPRINE HCL 10 MG PO TABS Oral Take 10 mg by mouth daily at 8 pm.        BP 151/93  Pulse 76  Temp(Src) 98.1 F (36.7 C) (Oral)  Resp 16  Ht 5\' 10"  (1.778 m)  Wt 248 lb (112.492 kg)  BMI 35.58 kg/m2  SpO2 96%  Physical Exam  Nursing note and vitals reviewed. Constitutional: He is oriented to person, place, and time. He appears well-developed and well-nourished. No distress.  HENT:  Head: Normocephalic and atraumatic.  Neck: Normal range of motion. Neck supple.  Cardiovascular: Normal rate and regular rhythm.   No murmur heard. Pulmonary/Chest: Breath sounds normal.  Abdominal: Soft. Bowel sounds are normal. He exhibits no distension. There is no tenderness.  Musculoskeletal: Normal range of motion. He exhibits no edema.  Neurological: He is alert and oriented to  person, place, and time.  Skin: Skin is warm and dry. He is not diaphoretic.    ED Course  Procedures (including critical care time)  Labs Reviewed  CBC - Abnormal; Notable for the following:    WBC 12.0 (*)    All other components within normal limits  DIFFERENTIAL - Abnormal; Notable for the following:    Lymphs Abs 4.3 (*)    All other components within normal limits  POCT I-STAT TROPONIN I  BASIC METABOLIC PANEL   Dg Chest Portable 1 View  07/04/2011  *RADIOLOGY REPORT*  Clinical Data: Mid chest pain, history MI, asthma, seizures, hypertension  PORTABLE CHEST - 1 VIEW  Comparison: Portable exam 0255 hours compared to 09/28/2010   Findings: Normal heart size, mediastinal contours, and pulmonary vascularity. Lungs clear. No pleural effusion or pneumothorax. Bones unremarkable.  IMPRESSION: No acute abnormalities. Questioned nodular density projecting over right upper lobe on previous exam is no longer identified.  Original Report Authenticated By: Lollie Marrow, M.D.     No diagnosis found.   Date: 07/04/2011  Rate: 76  Rhythm: normal sinus rhythm  QRS Axis: normal  Intervals: normal  ST/T Wave abnormalities: normal  Conduction Disutrbances:none  Narrative Interpretation:   Old EKG Reviewed: unchanged    MDM  The ekg and troponin are negative despite constant discomfort all day.  He had no relief with ntg, but substantial relief with toradol.  I doubt these symptoms are cardiac-related.  I will discharge him to home with nsaids, rest, time.  To return prn and follow up with pcp in the next week.        Geoffery Lyons, MD 07/04/11 [phone]

## 2011-07-04 NOTE — ED Notes (Signed)
Pt reports chest pain all day. Reports some nausea & vomiting. Pt took 3 nitro at home w/o relief,

## 2011-07-04 NOTE — ED Notes (Signed)
Pt alert & oriented x4, stable gait. Pt given discharge instructions, paperwork. Patient instructed to stop at the registration desk to finish any additional paperwork. pt verbalized understanding. Pt left department w/ no further questions.  

## 2011-07-04 NOTE — Discharge Instructions (Signed)
Chest Pain (Nonspecific) It is often hard to give a specific diagnosis for the cause of chest pain. There is always a chance that your pain could be related to something serious, such as a heart attack or a blood clot in the lungs. You need to follow up with your caregiver for further evaluation. CAUSES   Heartburn.   Pneumonia or bronchitis.   Anxiety or stress.   Inflammation around your heart (pericarditis) or lung (pleuritis or pleurisy).   A blood clot in the lung.   A collapsed lung (pneumothorax). It can develop suddenly on its own (spontaneous pneumothorax) or from injury (trauma) to the chest.   Shingles infection (herpes zoster virus).  The chest wall is composed of bones, muscles, and cartilage. Any of these can be the source of the pain.  The bones can be bruised by injury.   The muscles or cartilage can be strained by coughing or overwork.   The cartilage can be affected by inflammation and become sore (costochondritis).  DIAGNOSIS  Lab tests or other studies, such as X-rays, electrocardiography, stress testing, or cardiac imaging, may be needed to find the cause of your pain.  TREATMENT   Treatment depends on what may be causing your chest pain. Treatment may include:   Acid blockers for heartburn.   Anti-inflammatory medicine.   Pain medicine for inflammatory conditions.   Antibiotics if an infection is present.   You may be advised to change lifestyle habits. This includes stopping smoking and avoiding alcohol, caffeine, and chocolate.   You may be advised to keep your head raised (elevated) when sleeping. This reduces the chance of acid going backward from your stomach into your esophagus.   Most of the time, nonspecific chest pain will improve within 2 to 3 days with rest and mild pain medicine.  HOME CARE INSTRUCTIONS   If antibiotics were prescribed, take your antibiotics as directed. Finish them even if you start to feel better.   For the next few  days, avoid physical activities that bring on chest pain. Continue physical activities as directed.   Do not smoke.   Avoid drinking alcohol.   Only take over-the-counter or prescription medicine for pain, discomfort, or fever as directed by your caregiver.   Follow your caregiver's suggestions for further testing if your chest pain does not go away.   Keep any follow-up appointments you made. If you do not go to an appointment, you could develop lasting (chronic) problems with pain. If there is any problem keeping an appointment, you must call to reschedule.  SEEK MEDICAL CARE IF:   You think you are having problems from the medicine you are taking. Read your medicine instructions carefully.   Your chest pain does not go away, even after treatment.   You develop a rash with blisters on your chest.  SEEK IMMEDIATE MEDICAL CARE IF:   You have increased chest pain or pain that spreads to your arm, neck, jaw, back, or abdomen.   You develop shortness of breath, an increasing cough, or you are coughing up blood.   You have severe back or abdominal pain, feel nauseous, or vomit.   You develop severe weakness, fainting, or chills.   You have a fever.  THIS IS AN EMERGENCY. Do not wait to see if the pain will go away. Get medical help at once. Call your local emergency services (911 in [place] Do not drive yourself to the hospital. MAKE SURE YOU:   Understand these instructions.     Will watch your condition.   Will get help right away if you are not doing well or get worse.  Document Released: 01/23/2005 Document Revised: 04/04/2011 Document Reviewed: 11/19/2007 [employer] Patient Information 2012 [employer]. 

## 2011-08-22 DIAGNOSIS — R5381 Other malaise: Secondary | ICD-10-CM | POA: Diagnosis not present

## 2011-08-22 DIAGNOSIS — R42 Dizziness and giddiness: Secondary | ICD-10-CM | POA: Diagnosis not present

## 2011-08-22 DIAGNOSIS — F17200 Nicotine dependence, unspecified, uncomplicated: Secondary | ICD-10-CM | POA: Diagnosis not present

## 2011-08-22 DIAGNOSIS — R079 Chest pain, unspecified: Secondary | ICD-10-CM | POA: Diagnosis not present

## 2011-08-22 DIAGNOSIS — M79609 Pain in unspecified limb: Secondary | ICD-10-CM | POA: Diagnosis not present

## 2011-08-22 DIAGNOSIS — S93409A Sprain of unspecified ligament of unspecified ankle, initial encounter: Secondary | ICD-10-CM | POA: Diagnosis not present

## 2011-08-22 DIAGNOSIS — Z79899 Other long term (current) drug therapy: Secondary | ICD-10-CM | POA: Diagnosis not present

## 2011-08-22 DIAGNOSIS — I10 Essential (primary) hypertension: Secondary | ICD-10-CM | POA: Diagnosis not present

## 2011-08-22 DIAGNOSIS — S8010XA Contusion of unspecified lower leg, initial encounter: Secondary | ICD-10-CM | POA: Diagnosis not present

## 2011-08-22 DIAGNOSIS — S99919A Unspecified injury of unspecified ankle, initial encounter: Secondary | ICD-10-CM | POA: Diagnosis not present

## 2011-08-22 DIAGNOSIS — IMO0002 Reserved for concepts with insufficient information to code with codable children: Secondary | ICD-10-CM | POA: Diagnosis not present

## 2011-08-23 DIAGNOSIS — S93409A Sprain of unspecified ligament of unspecified ankle, initial encounter: Secondary | ICD-10-CM | POA: Diagnosis not present

## 2011-08-23 DIAGNOSIS — IMO0002 Reserved for concepts with insufficient information to code with codable children: Secondary | ICD-10-CM | POA: Diagnosis not present

## 2011-08-23 DIAGNOSIS — R52 Pain, unspecified: Secondary | ICD-10-CM | POA: Diagnosis not present

## 2011-08-23 DIAGNOSIS — I10 Essential (primary) hypertension: Secondary | ICD-10-CM | POA: Diagnosis not present

## 2011-08-23 DIAGNOSIS — J45909 Unspecified asthma, uncomplicated: Secondary | ICD-10-CM | POA: Diagnosis not present

## 2011-08-23 DIAGNOSIS — M79609 Pain in unspecified limb: Secondary | ICD-10-CM | POA: Diagnosis not present

## 2011-08-23 DIAGNOSIS — S99929A Unspecified injury of unspecified foot, initial encounter: Secondary | ICD-10-CM | POA: Diagnosis not present

## 2011-08-23 DIAGNOSIS — S8010XA Contusion of unspecified lower leg, initial encounter: Secondary | ICD-10-CM | POA: Diagnosis not present

## 2011-08-23 DIAGNOSIS — F17200 Nicotine dependence, unspecified, uncomplicated: Secondary | ICD-10-CM | POA: Diagnosis not present

## 2011-08-23 DIAGNOSIS — Z79899 Other long term (current) drug therapy: Secondary | ICD-10-CM | POA: Diagnosis not present

## 2011-08-26 DIAGNOSIS — Z880 Allergy status to penicillin: Secondary | ICD-10-CM | POA: Diagnosis not present

## 2011-08-26 DIAGNOSIS — J45909 Unspecified asthma, uncomplicated: Secondary | ICD-10-CM | POA: Diagnosis not present

## 2011-08-26 DIAGNOSIS — I10 Essential (primary) hypertension: Secondary | ICD-10-CM | POA: Diagnosis not present

## 2011-08-26 DIAGNOSIS — F17200 Nicotine dependence, unspecified, uncomplicated: Secondary | ICD-10-CM | POA: Diagnosis not present

## 2011-08-26 DIAGNOSIS — R630 Anorexia: Secondary | ICD-10-CM | POA: Diagnosis not present

## 2011-08-26 DIAGNOSIS — R0789 Other chest pain: Secondary | ICD-10-CM | POA: Diagnosis not present

## 2011-08-26 DIAGNOSIS — Z6836 Body mass index (BMI) 36.0-36.9, adult: Secondary | ICD-10-CM | POA: Diagnosis not present

## 2011-08-26 DIAGNOSIS — F411 Generalized anxiety disorder: Secondary | ICD-10-CM | POA: Diagnosis not present

## 2011-08-26 DIAGNOSIS — G40909 Epilepsy, unspecified, not intractable, without status epilepticus: Secondary | ICD-10-CM | POA: Diagnosis not present

## 2011-08-26 DIAGNOSIS — R9439 Abnormal result of other cardiovascular function study: Secondary | ICD-10-CM | POA: Diagnosis not present

## 2011-08-26 DIAGNOSIS — Z885 Allergy status to narcotic agent status: Secondary | ICD-10-CM | POA: Diagnosis not present

## 2011-08-26 DIAGNOSIS — R404 Transient alteration of awareness: Secondary | ICD-10-CM | POA: Diagnosis not present

## 2011-08-26 DIAGNOSIS — Z886 Allergy status to analgesic agent status: Secondary | ICD-10-CM | POA: Diagnosis not present

## 2011-08-26 DIAGNOSIS — I209 Angina pectoris, unspecified: Secondary | ICD-10-CM | POA: Diagnosis not present

## 2011-08-26 DIAGNOSIS — E669 Obesity, unspecified: Secondary | ICD-10-CM | POA: Diagnosis not present

## 2011-08-26 DIAGNOSIS — R079 Chest pain, unspecified: Secondary | ICD-10-CM | POA: Diagnosis not present

## 2011-08-26 DIAGNOSIS — E876 Hypokalemia: Secondary | ICD-10-CM | POA: Diagnosis not present

## 2011-08-26 DIAGNOSIS — Z79899 Other long term (current) drug therapy: Secondary | ICD-10-CM | POA: Diagnosis not present

## 2011-08-26 DIAGNOSIS — Z8249 Family history of ischemic heart disease and other diseases of the circulatory system: Secondary | ICD-10-CM | POA: Diagnosis not present

## 2011-08-27 DIAGNOSIS — I499 Cardiac arrhythmia, unspecified: Secondary | ICD-10-CM | POA: Diagnosis not present

## 2011-08-27 DIAGNOSIS — R0789 Other chest pain: Secondary | ICD-10-CM | POA: Diagnosis not present

## 2011-08-27 DIAGNOSIS — G40909 Epilepsy, unspecified, not intractable, without status epilepticus: Secondary | ICD-10-CM | POA: Diagnosis not present

## 2011-08-27 DIAGNOSIS — R079 Chest pain, unspecified: Secondary | ICD-10-CM | POA: Diagnosis not present

## 2011-08-27 DIAGNOSIS — E669 Obesity, unspecified: Secondary | ICD-10-CM | POA: Diagnosis not present

## 2011-08-27 DIAGNOSIS — J45909 Unspecified asthma, uncomplicated: Secondary | ICD-10-CM | POA: Diagnosis not present

## 2011-08-27 DIAGNOSIS — R9431 Abnormal electrocardiogram [ECG] [EKG]: Secondary | ICD-10-CM | POA: Diagnosis not present

## 2011-08-27 DIAGNOSIS — I209 Angina pectoris, unspecified: Secondary | ICD-10-CM | POA: Diagnosis not present

## 2011-08-28 DIAGNOSIS — R9431 Abnormal electrocardiogram [ECG] [EKG]: Secondary | ICD-10-CM | POA: Diagnosis not present

## 2011-08-28 DIAGNOSIS — I499 Cardiac arrhythmia, unspecified: Secondary | ICD-10-CM | POA: Diagnosis not present

## 2011-08-28 DIAGNOSIS — J45909 Unspecified asthma, uncomplicated: Secondary | ICD-10-CM | POA: Diagnosis not present

## 2011-08-28 DIAGNOSIS — G40909 Epilepsy, unspecified, not intractable, without status epilepticus: Secondary | ICD-10-CM | POA: Diagnosis not present

## 2011-08-28 DIAGNOSIS — I209 Angina pectoris, unspecified: Secondary | ICD-10-CM | POA: Diagnosis not present

## 2011-08-28 DIAGNOSIS — R079 Chest pain, unspecified: Secondary | ICD-10-CM | POA: Diagnosis not present

## 2011-08-28 DIAGNOSIS — E669 Obesity, unspecified: Secondary | ICD-10-CM | POA: Diagnosis not present

## 2011-08-29 DIAGNOSIS — R0789 Other chest pain: Secondary | ICD-10-CM | POA: Diagnosis not present

## 2011-08-29 DIAGNOSIS — R079 Chest pain, unspecified: Secondary | ICD-10-CM | POA: Diagnosis not present

## 2011-09-03 DIAGNOSIS — R079 Chest pain, unspecified: Secondary | ICD-10-CM | POA: Diagnosis not present

## 2011-09-03 DIAGNOSIS — I498 Other specified cardiac arrhythmias: Secondary | ICD-10-CM | POA: Diagnosis not present

## 2011-09-03 DIAGNOSIS — R9439 Abnormal result of other cardiovascular function study: Secondary | ICD-10-CM | POA: Diagnosis not present

## 2011-09-04 DIAGNOSIS — R079 Chest pain, unspecified: Secondary | ICD-10-CM | POA: Diagnosis not present

## 2011-09-04 DIAGNOSIS — J45909 Unspecified asthma, uncomplicated: Secondary | ICD-10-CM | POA: Diagnosis not present

## 2011-09-04 DIAGNOSIS — Z79899 Other long term (current) drug therapy: Secondary | ICD-10-CM | POA: Diagnosis not present

## 2011-09-04 DIAGNOSIS — E119 Type 2 diabetes mellitus without complications: Secondary | ICD-10-CM | POA: Diagnosis not present

## 2011-09-04 DIAGNOSIS — I10 Essential (primary) hypertension: Secondary | ICD-10-CM | POA: Diagnosis not present

## 2011-09-04 DIAGNOSIS — F17200 Nicotine dependence, unspecified, uncomplicated: Secondary | ICD-10-CM | POA: Diagnosis not present

## 2011-09-04 DIAGNOSIS — Z7982 Long term (current) use of aspirin: Secondary | ICD-10-CM | POA: Diagnosis not present

## 2011-09-04 DIAGNOSIS — R071 Chest pain on breathing: Secondary | ICD-10-CM | POA: Diagnosis not present

## 2011-09-04 DIAGNOSIS — E7800 Pure hypercholesterolemia, unspecified: Secondary | ICD-10-CM | POA: Diagnosis not present

## 2011-09-19 DIAGNOSIS — I209 Angina pectoris, unspecified: Secondary | ICD-10-CM | POA: Diagnosis not present

## 2011-11-10 ENCOUNTER — Encounter (HOSPITAL_COMMUNITY): Payer: Self-pay | Admitting: Emergency Medicine

## 2011-11-10 ENCOUNTER — Emergency Department (HOSPITAL_COMMUNITY)
Admission: EM | Admit: 2011-11-10 | Discharge: 2011-11-10 | Disposition: A | Discharge status: Home / Self Care | Payer: Medicare Other | Attending: Emergency Medicine | Admitting: Emergency Medicine

## 2011-11-10 DIAGNOSIS — I10 Essential (primary) hypertension: Secondary | ICD-10-CM | POA: Insufficient documentation

## 2011-11-10 DIAGNOSIS — I2510 Atherosclerotic heart disease of native coronary artery without angina pectoris: Secondary | ICD-10-CM | POA: Diagnosis not present

## 2011-11-10 DIAGNOSIS — L259 Unspecified contact dermatitis, unspecified cause: Secondary | ICD-10-CM | POA: Insufficient documentation

## 2011-11-10 DIAGNOSIS — L309 Dermatitis, unspecified: Secondary | ICD-10-CM

## 2011-11-10 DIAGNOSIS — G40909 Epilepsy, unspecified, not intractable, without status epilepticus: Secondary | ICD-10-CM | POA: Diagnosis not present

## 2011-11-10 DIAGNOSIS — F17200 Nicotine dependence, unspecified, uncomplicated: Secondary | ICD-10-CM | POA: Insufficient documentation

## 2011-11-10 HISTORY — DX: Atherosclerotic heart disease of native coronary artery without angina pectoris: I25.10

## 2011-11-10 HISTORY — DX: Angina pectoris, unspecified: I20.9

## 2011-11-10 MED ORDER — PREDNISONE 20 MG PO TABS
60.0000 mg | ORAL_TABLET | Freq: Once | ORAL | Status: AC
  Administered 2011-11-10: 60 mg via ORAL
  Filled 2011-11-10: qty 3

## 2011-11-10 MED ORDER — DIPHENHYDRAMINE HCL 25 MG PO CAPS
50.0000 mg | ORAL_CAPSULE | Freq: Once | ORAL | Status: AC
  Administered 2011-11-10: 50 mg via ORAL
  Filled 2011-11-10: qty 2

## 2011-11-10 MED ORDER — PREDNISONE 50 MG PO TABS: ORAL_TABLET | ORAL | Status: AC

## 2011-11-10 MED ORDER — FAMOTIDINE 20 MG PO TABS
20.0000 mg | ORAL_TABLET | Freq: Once | ORAL | Status: AC
  Administered 2011-11-10: 20 mg via ORAL
  Filled 2011-11-10: qty 1

## 2011-11-10 NOTE — ED Notes (Signed)
Patient c/o rash from waist down x2-3 days. Patient reports itching but denies pain.

## 2011-11-10 NOTE — ED Provider Notes (Signed)
History     CSN: [id number]  Arrival date & time 11/10/11  1191   First MD Initiated Contact with Patient 11/10/11 0848      Chief Complaint  Patient presents with  . Rash    (Consider location/radiation/quality/duration/timing/severity/associated sxs/prior treatment) HPI Comments: Rash to both inner thighs and buttocks.  + pruritic.  No identifiable endogenous or exogenous allergen exposure.  No diff breathing or swallowing.  Patient is a 31 y.o. male presenting with rash. The history is provided by the patient. No language interpreter was used.  Rash  This is a new problem. The current episode started 2 days ago. The problem has not changed since onset.The problem is associated with nothing. There has been no fever. The patient is experiencing no pain. He has tried nothing for the symptoms.    Past Medical History  Diagnosis Date  . Seizure disorder   . Asthma   . Hypertension   . Acute angina   . CAD (coronary artery disease)     Past Surgical History  Procedure Date  . Cholecystectomy   . Eye surgery   . Facial reconstruction surgery     Family History  Problem Relation Age of Onset  . Diabetes Mother   . Hypertension Mother   . Heart failure Father   . Stroke Father   . Diabetes Father   . Heart failure Brother     History  Substance Use Topics  . Smoking status: Current Everyday Smoker -- 0.5 packs/day for 20 years    Types: Cigarettes  . Smokeless tobacco: Never Used  . Alcohol Use: No     occasionally      Review of Systems  Constitutional: Negative for fever and chills.  HENT: Negative for trouble swallowing.   Respiratory: Negative for shortness of breath.   Skin: Positive for rash.  All other systems reviewed and are negative.    Allergies  Morphine and related; Penicillins; Tramadol; and Ibuprofen  Home Medications   Current Outpatient Rx  Name Route Sig Dispense Refill  . VENTOLIN HFA IN Inhalation Inhale 2 puffs into the lungs  as needed. SHORTNESS OF BREATH     . ASPIRIN 81 MG PO TABS Oral Take 81 mg by mouth daily.    Marland Kitchen BENAZEPRIL-HYDROCHLOROTHIAZIDE 20-25 MG PO TABS Oral Take 1 tablet by mouth daily.    . CYCLOBENZAPRINE HCL 10 MG PO TABS Oral Take 10 mg by mouth daily at 8 pm.      . DIVALPROEX SODIUM ER 500 MG PO TB24 Oral Take 1,000 mg by mouth daily at 8 pm.      . NITROGLYCERIN 0.4 MG SL SUBL Sublingual Place 0.4 mg under the tongue every 5 (five) minutes as needed.    Marland Kitchen PHENYTOIN SODIUM EXTENDED 100 MG PO CAPS Oral Take 300 mg by mouth every morning.      Marland Kitchen PREDNISONE 50 MG PO TABS  One tab po QD 5 tablet 0  . TRAZODONE HCL 300 MG PO TABS Oral Take 300 mg by mouth at bedtime.      BP 125/81  Pulse 71  Temp 97.8 F (36.6 C) (Oral)  Resp 18  Ht 5' 10.5" (1.791 m)  Wt 245 lb (111.131 kg)  BMI 34.66 kg/m2  SpO2 99%  Physical Exam  Nursing note and vitals reviewed. Constitutional: He is oriented to person, place, and time. He appears well-developed and well-nourished.  HENT:  Head: Normocephalic and atraumatic.  Eyes: EOM are normal.  Neck: Normal  range of motion.  Cardiovascular: Normal rate, regular rhythm, normal heart sounds and intact distal pulses.   Pulmonary/Chest: Effort normal and breath sounds normal. No stridor. No respiratory distress. He has no wheezes. He has no rales.  Abdominal: Soft. He exhibits no distension. There is no tenderness.  Musculoskeletal: Normal range of motion.  Neurological: He is alert and oriented to person, place, and time.  Skin: Skin is warm and dry. Rash noted. Rash is maculopapular.     Psychiatric: He has a normal mood and affect. Judgment normal.    ED Course  Procedures (including critical care time)  Labs Reviewed - No data to display No results found.   1. Dermatitis       MDM  rx-prednisone 50 mg, 5 Benadryl 50 mg QID pepcid 20 mg BID. F/u with PCP prn         Evalina Field, PA 11/10/11 0917  Evalina Field, PA 11/10/11  [phone]

## 2011-11-10 NOTE — ED Provider Notes (Signed)
Medical screening examination/treatment/procedure(s) were performed by non-physician practitioner and as supervising physician I was immediately available for consultation/collaboration.   Benny Lennert, MD 11/10/11 [phone]

## 2011-11-12 DIAGNOSIS — I2510 Atherosclerotic heart disease of native coronary artery without angina pectoris: Secondary | ICD-10-CM | POA: Diagnosis not present

## 2011-11-12 DIAGNOSIS — Z79899 Other long term (current) drug therapy: Secondary | ICD-10-CM | POA: Diagnosis not present

## 2011-11-12 DIAGNOSIS — R109 Unspecified abdominal pain: Secondary | ICD-10-CM | POA: Insufficient documentation

## 2011-11-12 DIAGNOSIS — J45909 Unspecified asthma, uncomplicated: Secondary | ICD-10-CM | POA: Insufficient documentation

## 2011-11-12 DIAGNOSIS — Z9089 Acquired absence of other organs: Secondary | ICD-10-CM | POA: Insufficient documentation

## 2011-11-12 DIAGNOSIS — R1012 Left upper quadrant pain: Secondary | ICD-10-CM | POA: Diagnosis not present

## 2011-11-12 DIAGNOSIS — F17200 Nicotine dependence, unspecified, uncomplicated: Secondary | ICD-10-CM | POA: Insufficient documentation

## 2011-11-12 DIAGNOSIS — E876 Hypokalemia: Secondary | ICD-10-CM | POA: Insufficient documentation

## 2011-11-12 DIAGNOSIS — I10 Essential (primary) hypertension: Secondary | ICD-10-CM | POA: Diagnosis not present

## 2011-11-13 ENCOUNTER — Emergency Department (HOSPITAL_COMMUNITY)
Admission: EM | Admit: 2011-11-13 | Discharge: 2011-11-13 | Disposition: A | Discharge status: Home / Self Care | Payer: Medicare Other | Attending: Emergency Medicine | Admitting: Emergency Medicine

## 2011-11-13 ENCOUNTER — Encounter (HOSPITAL_COMMUNITY): Payer: Self-pay | Admitting: Emergency Medicine

## 2011-11-13 ENCOUNTER — Emergency Department (HOSPITAL_COMMUNITY): Payer: Medicare Other

## 2011-11-13 DIAGNOSIS — R109 Unspecified abdominal pain: Secondary | ICD-10-CM | POA: Diagnosis not present

## 2011-11-13 DIAGNOSIS — E876 Hypokalemia: Secondary | ICD-10-CM

## 2011-11-13 LAB — COMPREHENSIVE METABOLIC PANEL
Albumin: 4.1 g/dL (ref 3.5–5.2)
Alkaline Phosphatase: 113 U/L (ref 39–117)
BUN: 14 mg/dL (ref 6–23)
CO2: 25 mEq/L (ref 19–32)
Chloride: 96 mEq/L (ref 96–112)
Creatinine, Ser: 1.01 mg/dL (ref 0.50–1.35)
GFR calc Af Amer: >90 mL/min (ref >90)
GFR calc non Af Amer: >90 mL/min (ref >90)
Glucose, Bld: 88 mg/dL (ref 70–99)
Potassium: 3.2 mEq/L — ABNORMAL LOW (ref 3.5–5.1)
Total Bilirubin: 1.5 mg/dL — ABNORMAL HIGH (ref 0.3–1.2)

## 2011-11-13 LAB — LIPASE, BLOOD: Lipase: 17 U/L (ref 11–59)

## 2011-11-13 LAB — URINALYSIS, ROUTINE W REFLEX MICROSCOPIC
Hgb urine dipstick: NEGATIVE
Specific Gravity, Urine: >1.03 — ABNORMAL HIGH (ref 1.005–1.030)
pH: 6 (ref 5.0–8.0)

## 2011-11-13 MED ORDER — POTASSIUM CHLORIDE CRYS ER 20 MEQ PO TBCR
20.0000 meq | EXTENDED_RELEASE_TABLET | Freq: Once | ORAL | Status: AC
  Administered 2011-11-13: 20 meq via ORAL
  Filled 2011-11-13: qty 1

## 2011-11-13 MED ORDER — HYDROMORPHONE HCL PF 1 MG/ML IJ SOLN: 1.0000 mg | Freq: Once | INTRAMUSCULAR | Status: AC

## 2011-11-13 MED ORDER — HYDROMORPHONE HCL PF 1 MG/ML IJ SOLN: 1.0000 mg | Freq: Once | INTRAMUSCULAR | Status: DC

## 2011-11-13 MED ORDER — HYDROMORPHONE HCL PF 1 MG/ML IJ SOLN
1.0000 mg | Freq: Once | INTRAMUSCULAR | Status: DC
  Administered 2011-11-13: 1 mg via INTRAVENOUS

## 2011-11-13 MED ORDER — ONDANSETRON HCL 4 MG/2ML IJ SOLN: 4.0000 mg | Freq: Once | INTRAMUSCULAR | Status: DC

## 2011-11-13 MED ORDER — ONDANSETRON HCL 4 MG/2ML IJ SOLN
4.0000 mg | Freq: Once | INTRAMUSCULAR | Status: AC
  Administered 2011-11-13: 4 mg via INTRAVENOUS
  Filled 2011-11-13: qty 2

## 2011-11-13 MED ORDER — ONDANSETRON 8 MG PO TBDP: 8.0000 mg | ORAL_TABLET | Freq: Once | ORAL | Status: DC

## 2011-11-13 MED ORDER — SODIUM CHLORIDE 0.9 % IV SOLN: INTRAVENOUS | Status: DC

## 2011-11-13 MED ORDER — HYDROMORPHONE HCL PF 1 MG/ML IJ SOLN
1.0000 mg | Freq: Once | INTRAMUSCULAR | Status: AC
  Administered 2011-11-13: 1 mg via INTRAVENOUS
  Filled 2011-11-13: qty 1

## 2011-11-13 MED ORDER — HYDROMORPHONE HCL PF 1 MG/ML IJ SOLN
INTRAMUSCULAR | Status: AC
  Administered 2011-11-13: 1 mg via INTRAVENOUS
  Filled 2011-11-13: qty 1

## 2011-11-13 NOTE — ED Provider Notes (Signed)
Medical screening examination/treatment/procedure(s) were conducted as a shared visit with non-physician practitioner(s) and myself.  I personally evaluated the patient during the encounter  Nicoletta Dress. Colon Branch, MD 11/13/11 [phone]

## 2011-11-13 NOTE — Progress Notes (Signed)
0158 Assumed care/disposition of patient who presents with LUQ and flank pain. Given analgesics in the ER. Potassium repleted.  Awaiting CT scan to r/o stone. 0981 CT negative for acute process. Dx testing d/w pt and family.  Questions answered.  Verb understanding, agreeable to d/c home with outpt f/u.Pt stable in ED with no significant deterioration in condition.The patient appears reasonably screened and/or stabilized for discharge and I doubt any other medical condition or other [facility] requiring further screening, evaluation, or treatment in the ED at this time prior to discharge.  Ct Abdomen Pelvis Wo Contrast  11/13/2011  *RADIOLOGY REPORT*  Clinical Data: Left-sided abdominal pain.  History of kidney stones.  CT ABDOMEN AND PELVIS WITHOUT CONTRAST  Technique:  Multidetector CT imaging of the abdomen and pelvis was performed following the standard protocol without intravenous contrast.  Comparison: 02/13/2011  Findings: Dependent atelectasis in the lung bases.  The kidneys appear symmetrical in size and shape.  No pyelocaliectasis or ureterectasis.  No renal, ureteral, or bladder stones.  Bladder wall is not thickened.  There is a sub centimeter exophytic lesion arising from the lower pole of the right kidney probably representing a small cyst and stable since the previous study.  Surgical absence of the gallbladder.  The unenhanced appearance of the liver, spleen, pancreas, adrenal glands, abdominal aorta, and retroperitoneal lymph nodes is unremarkable.  The stomach, small bowel, and colon are not abnormally distended.  No free air or free fluid in the abdomen.  Pelvis:  The prostate gland is not enlarged.  The bladder wall is not thickened.  No free or loculated pelvic fluid collections.  No evidence of diverticulitis.  The appendix is normal.  Normal alignment of the lumbar vertebrae.  IMPRESSION: No renal or ureteral stone or obstruction.  Original Report Authenticated By: Marlon Pel, M.D.

## 2011-11-13 NOTE — ED Notes (Signed)
Pt states he started having left sided abdominal pain today, denies nausea or vomiting.  Last BM 12pm today.

## 2011-11-13 NOTE — ED Provider Notes (Addendum)
History     CSN: [id number]  Arrival date & time 11/12/11  2343   First MD Initiated Contact with Patient 11/13/11 0002      Chief Complaint  Patient presents with  . Abdominal Pain    (Consider location/radiation/quality/duration/timing/severity/associated sxs/prior treatment) Patient is a 31 y.o. male presenting with abdominal pain. The history is provided by the patient.  Abdominal Pain The primary symptoms of the illness include abdominal pain. The primary symptoms of the illness do not include fever, shortness of breath, nausea, vomiting or diarrhea. The current episode started 1 to 2 hours ago. The onset of the illness was sudden. The problem has not changed since onset. The abdominal pain is located in the LUQ and left flank. The abdominal pain does not radiate. The severity of the abdominal pain is 9/10. The abdominal pain is relieved by nothing (His last bowel movement was around 12 noon today.).  Associated with: He denies etoh use.  He does report a history of right sided kidney stones.  Tonights symptoms are somewhat similar.  He notes having dark urine tonight. Symptoms associated with the illness do not include chills, anorexia, diaphoresis, constipation, hematuria, frequency or back pain. Significant associated medical issues include cardiac disease. Significant associated medical issues do not include PUD, diabetes, liver disease or substance abuse.    Past Medical History  Diagnosis Date  . Seizure disorder   . Asthma   . Hypertension   . Acute angina   . CAD (coronary artery disease)     Past Surgical History  Procedure Date  . Cholecystectomy   . Eye surgery   . Facial reconstruction surgery     Family History  Problem Relation Age of Onset  . Diabetes Mother   . Hypertension Mother   . Heart failure Father   . Stroke Father   . Diabetes Father   . Heart failure Brother     History  Substance Use Topics  . Smoking status: Current Everyday Smoker --  0.5 packs/day for 20 years    Types: Cigarettes  . Smokeless tobacco: Never Used  . Alcohol Use: Yes     occasionally      Review of Systems  Constitutional: Negative for fever, chills and diaphoresis.  HENT: Negative for congestion, sore throat and neck pain.   Eyes: Negative.   Respiratory: Negative for chest tightness and shortness of breath.   Cardiovascular: Negative for chest pain.  Gastrointestinal: Positive for abdominal pain. Negative for nausea, vomiting, diarrhea, constipation and anorexia.  Genitourinary: Negative.  Negative for frequency, hematuria and difficulty urinating.  Musculoskeletal: Negative for back pain, joint swelling and arthralgias.  Skin: Negative.  Negative for rash and wound.  Neurological: Negative for dizziness, weakness, light-headedness, numbness and headaches.  Hematological: Negative.   Psychiatric/Behavioral: Negative.     Allergies  Morphine and related; Penicillins; Tramadol; and Ibuprofen  Home Medications   Current Outpatient Rx  Name Route Sig Dispense Refill  . VENTOLIN HFA IN Inhalation Inhale 2 puffs into the lungs as needed. SHORTNESS OF BREATH     . ASPIRIN 81 MG PO TABS Oral Take 81 mg by mouth daily.    Marland Kitchen BENAZEPRIL-HYDROCHLOROTHIAZIDE 20-25 MG PO TABS Oral Take 1 tablet by mouth daily.    . CYCLOBENZAPRINE HCL 10 MG PO TABS Oral Take 10 mg by mouth daily at 8 pm.      . DIVALPROEX SODIUM ER 500 MG PO TB24 Oral Take 1,000 mg by mouth daily at 8 pm.      .  NITROGLYCERIN 0.4 MG SL SUBL Sublingual Place 0.4 mg under the tongue every 5 (five) minutes as needed.    Marland Kitchen PHENYTOIN SODIUM EXTENDED 100 MG PO CAPS Oral Take 300 mg by mouth every morning.      Marland Kitchen PREDNISONE 50 MG PO TABS  One tab po QD 5 tablet 0  . TRAZODONE HCL 300 MG PO TABS Oral Take 300 mg by mouth at bedtime.      BP 111/75  Pulse 92  Temp 98.1 F (36.7 C) (Oral)  Resp 18  Ht 5\' 10"  (1.778 m)  Wt 245 lb (111.131 kg)  BMI 35.15 kg/m2  SpO2 98%  Physical Exam    Nursing note and vitals reviewed. Constitutional: He appears well-developed and well-nourished.  HENT:  Head: Normocephalic and atraumatic.  Eyes: Conjunctivae are normal.  Neck: Normal range of motion.  Cardiovascular: Normal rate, regular rhythm, normal heart sounds and intact distal pulses.   Pulmonary/Chest: Effort normal and breath sounds normal. He has no wheezes.  Abdominal: Soft. Bowel sounds are normal. He exhibits no distension and no mass. There is tenderness in the left upper quadrant and left lower quadrant. There is no rebound, no guarding and no CVA tenderness.  Musculoskeletal: Normal range of motion.  Neurological: He is alert.  Skin: Skin is warm and dry.  Psychiatric: He has a normal mood and affect.    ED Course  Procedures (including critical care time)  Labs Reviewed  URINALYSIS, ROUTINE W REFLEX MICROSCOPIC - Abnormal; Notable for the following:    Specific Gravity, Urine >1.030 (*)     Bilirubin Urine SMALL (*)     Ketones, ur TRACE (*)     All other components within normal limits  COMPREHENSIVE METABOLIC PANEL - Abnormal; Notable for the following:    Sodium 134 (*)     Potassium 3.2 (*)     Total Bilirubin 1.5 (*)     All other components within normal limits  LIPASE, BLOOD   No results found.   No diagnosis found.    MDM  Patient with sudden onset luq and flank pain prior to arrival.  Pending Ct to assess for possible kidney stone.  Dr Colon Branch to dispo pt once ct results completed.  Pt does have hypokalemia.  PO dose ordered. In ed.        Burgess Amor, PA 11/13/11 0217  Burgess Amor, PA 12/02/11 2042

## 2011-11-24 ENCOUNTER — Encounter (HOSPITAL_COMMUNITY): Payer: Self-pay

## 2011-11-24 ENCOUNTER — Emergency Department (HOSPITAL_COMMUNITY)
Admission: EM | Admit: 2011-11-24 | Discharge: 2011-11-25 | Disposition: A | Discharge status: Home / Self Care | Payer: Medicare Other | Attending: Emergency Medicine | Admitting: Emergency Medicine

## 2011-11-24 ENCOUNTER — Emergency Department (HOSPITAL_COMMUNITY): Payer: Medicare Other

## 2011-11-24 DIAGNOSIS — I10 Essential (primary) hypertension: Secondary | ICD-10-CM | POA: Diagnosis not present

## 2011-11-24 DIAGNOSIS — Z880 Allergy status to penicillin: Secondary | ICD-10-CM | POA: Insufficient documentation

## 2011-11-24 DIAGNOSIS — J45909 Unspecified asthma, uncomplicated: Secondary | ICD-10-CM | POA: Insufficient documentation

## 2011-11-24 DIAGNOSIS — S4980XA Other specified injuries of shoulder and upper arm, unspecified arm, initial encounter: Secondary | ICD-10-CM | POA: Diagnosis not present

## 2011-11-24 DIAGNOSIS — X500XXA Overexertion from strenuous movement or load, initial encounter: Secondary | ICD-10-CM | POA: Insufficient documentation

## 2011-11-24 DIAGNOSIS — G40909 Epilepsy, unspecified, not intractable, without status epilepticus: Secondary | ICD-10-CM | POA: Diagnosis not present

## 2011-11-24 DIAGNOSIS — F17200 Nicotine dependence, unspecified, uncomplicated: Secondary | ICD-10-CM | POA: Insufficient documentation

## 2011-11-24 DIAGNOSIS — I2510 Atherosclerotic heart disease of native coronary artery without angina pectoris: Secondary | ICD-10-CM | POA: Insufficient documentation

## 2011-11-24 DIAGNOSIS — S4991XA Unspecified injury of right shoulder and upper arm, initial encounter: Secondary | ICD-10-CM

## 2011-11-24 DIAGNOSIS — S46909A Unspecified injury of unspecified muscle, fascia and tendon at shoulder and upper arm level, unspecified arm, initial encounter: Secondary | ICD-10-CM | POA: Insufficient documentation

## 2011-11-24 HISTORY — DX: Unspecified convulsions: R56.9

## 2011-11-24 MED ORDER — HYDROCODONE-ACETAMINOPHEN 5-325 MG PO TABS
1.0000 | ORAL_TABLET | Freq: Once | ORAL | Status: AC
  Administered 2011-11-24: 1 via ORAL
  Filled 2011-11-24: qty 1

## 2011-11-24 NOTE — ED Notes (Signed)
Was helping a friend move and I think I pulled something in my right shoulder per pt.

## 2011-11-25 MED ORDER — HYDROCODONE-ACETAMINOPHEN 5-325 MG PO TABS: 1.0000 | ORAL_TABLET | Freq: Once | ORAL | Status: AC

## 2011-11-27 NOTE — ED Provider Notes (Signed)
History     CSN: [id number]  Arrival date & time 11/24/11  2232   First MD Initiated Contact with Patient 11/24/11 2251      Chief Complaint  Patient presents with  . Shoulder Injury    (Consider location/radiation/quality/duration/timing/severity/associated sxs/prior treatment) HPI Comments: Chad Hampton presents with injury to his right shoulder after helping a friend move, lifting a heavy piece of furniture.  He states he felt his right shoulder "popped" out of place,  And then popped back in .  Pain is sharp with movement,  Aching at rest, and  worse with range of motion and palpation.  He denies weakness,  numbness and tingling in his right arm and hand.  The history is provided by the patient.    Past Medical History  Diagnosis Date  . Seizure disorder   . Asthma   . Hypertension   . Acute angina   . CAD (coronary artery disease)   . Seizures     Past Surgical History  Procedure Date  . Cholecystectomy   . Eye surgery   . Facial reconstruction surgery     Family History  Problem Relation Age of Onset  . Diabetes Mother   . Hypertension Mother   . Heart failure Father   . Stroke Father   . Diabetes Father   . Heart failure Brother     History  Substance Use Topics  . Smoking status: Current Everyday Smoker -- 0.5 packs/day for 20 years    Types: Cigarettes  . Smokeless tobacco: Never Used  . Alcohol Use: Yes     occasionally      Review of Systems  Musculoskeletal: Positive for arthralgias. Negative for joint swelling.  Skin: Negative for wound.  Neurological: Negative for weakness and numbness.    Allergies  Morphine and related; Penicillins; Tramadol; and Ibuprofen  Home Medications   Current Outpatient Rx  Name Route Sig Dispense Refill  . VENTOLIN HFA IN Inhalation Inhale 2 puffs into the lungs as needed. SHORTNESS OF BREATH     . ASPIRIN 81 MG PO TABS Oral Take 81 mg by mouth daily.    Marland Kitchen BENAZEPRIL-HYDROCHLOROTHIAZIDE 20-25 MG PO  TABS Oral Take 1 tablet by mouth daily.    . CYCLOBENZAPRINE HCL 10 MG PO TABS Oral Take 10 mg by mouth daily at 8 pm.      . DIVALPROEX SODIUM ER 500 MG PO TB24 Oral Take 1,000 mg by mouth daily at 8 pm.      . HYDROCODONE-ACETAMINOPHEN 5-325 MG PO TABS Oral Take 1 tablet by mouth once. 10 tablet 0  . NITROGLYCERIN 0.4 MG SL SUBL Sublingual Place 0.4 mg under the tongue every 5 (five) minutes as needed.    Marland Kitchen PHENYTOIN SODIUM EXTENDED 100 MG PO CAPS Oral Take 300 mg by mouth every morning.      . TRAZODONE HCL 300 MG PO TABS Oral Take 300 mg by mouth at bedtime.      BP 145/88  Pulse 112  Temp 98.4 F (36.9 C) (Oral)  Resp 20  Ht 5' 11.5" (1.816 m)  Wt 245 lb (111.131 kg)  BMI 33.69 kg/m2  SpO2 95%  Physical Exam  Constitutional: He appears well-developed and well-nourished.  HENT:  Head: Atraumatic.  Neck: Normal range of motion.  Cardiovascular:       Pulses equal bilaterally  Musculoskeletal: He exhibits tenderness.       Right shoulder: He exhibits tenderness and pain. He exhibits no swelling, no  effusion, no crepitus, no deformity, normal pulse and normal strength.  Neurological: He is alert. He has normal strength. He displays normal reflexes. No sensory deficit.       Equal strength  Skin: Skin is warm and dry.  Psychiatric: He has a normal mood and affect.    ED Course  Procedures (including critical care time)  Labs Reviewed - No data to display No results found.   1. Injury of shoulder, right       MDM  xrays reviewed.  Pt prescribed hydrocodone,  Sling applied. Ice, rest. Referral to Dr Hilda Lias for recheck prn if sx are not improved over the next week.        Burgess Amor, [place] 11/27/11 [phone]

## 2011-11-28 NOTE — ED Provider Notes (Signed)
Medical screening examination/treatment/procedure(s) were performed by non-physician practitioner and as supervising physician I was immediately available for consultation/collaboration.  Geoffery Lyons, MD 11/28/11 [phone]

## 2011-11-29 ENCOUNTER — Emergency Department (HOSPITAL_COMMUNITY): Payer: Medicare Other

## 2011-11-29 ENCOUNTER — Emergency Department (HOSPITAL_COMMUNITY)
Admission: EM | Admit: 2011-11-29 | Discharge: 2011-11-29 | Disposition: A | Discharge status: Home / Self Care | Payer: Medicare Other | Attending: Emergency Medicine | Admitting: Emergency Medicine

## 2011-11-29 ENCOUNTER — Encounter (HOSPITAL_COMMUNITY): Payer: Self-pay

## 2011-11-29 DIAGNOSIS — Z880 Allergy status to penicillin: Secondary | ICD-10-CM | POA: Insufficient documentation

## 2011-11-29 DIAGNOSIS — I10 Essential (primary) hypertension: Secondary | ICD-10-CM | POA: Insufficient documentation

## 2011-11-29 DIAGNOSIS — I2510 Atherosclerotic heart disease of native coronary artery without angina pectoris: Secondary | ICD-10-CM | POA: Insufficient documentation

## 2011-11-29 DIAGNOSIS — J45909 Unspecified asthma, uncomplicated: Secondary | ICD-10-CM | POA: Diagnosis not present

## 2011-11-29 DIAGNOSIS — G40909 Epilepsy, unspecified, not intractable, without status epilepticus: Secondary | ICD-10-CM | POA: Insufficient documentation

## 2011-11-29 DIAGNOSIS — Z9119 Patient's noncompliance with other medical treatment and regimen: Secondary | ICD-10-CM | POA: Diagnosis not present

## 2011-11-29 DIAGNOSIS — R079 Chest pain, unspecified: Secondary | ICD-10-CM | POA: Diagnosis not present

## 2011-11-29 DIAGNOSIS — F17200 Nicotine dependence, unspecified, uncomplicated: Secondary | ICD-10-CM | POA: Diagnosis not present

## 2011-11-29 DIAGNOSIS — Z91199 Patient's noncompliance with other medical treatment and regimen due to unspecified reason: Secondary | ICD-10-CM | POA: Insufficient documentation

## 2011-11-29 DIAGNOSIS — R072 Precordial pain: Secondary | ICD-10-CM | POA: Diagnosis not present

## 2011-11-29 LAB — CBC WITH DIFFERENTIAL/PLATELET
Basophils Absolute: 0 10*3/uL (ref 0.0–0.1)
Basophils Relative: 0 % (ref 0–1)
Hemoglobin: 17 g/dL (ref 13.0–17.0)
Lymphocytes Relative: 35 % (ref 12–46)
MCHC: 35.3 g/dL (ref 30.0–36.0)
Neutro Abs: 6.1 10*3/uL (ref 1.7–7.7)
Neutrophils Relative %: 57 % (ref 43–77)
RDW: 12.9 % (ref 11.5–15.5)
WBC: 10.8 10*3/uL — ABNORMAL HIGH (ref 4.0–10.5)

## 2011-11-29 LAB — TROPONIN I
Troponin I: <0.3 ng/mL (ref <0.30)
Troponin I: <0.3 ng/mL (ref <0.30)

## 2011-11-29 LAB — BASIC METABOLIC PANEL
CO2: 22 mEq/L (ref 19–32)
Chloride: 101 mEq/L (ref 96–112)
GFR calc Af Amer: >90 mL/min (ref >90)
Potassium: 3.2 mEq/L — ABNORMAL LOW (ref 3.5–5.1)

## 2011-11-29 MED ORDER — NITROGLYCERIN 0.4 MG SL SUBL
0.4000 mg | SUBLINGUAL_TABLET | SUBLINGUAL | Status: DC | PRN
  Administered 2011-11-29: 0.4 mg via SUBLINGUAL

## 2011-11-29 MED ORDER — ASPIRIN 81 MG PO CHEW
324.0000 mg | CHEWABLE_TABLET | Freq: Once | ORAL | Status: AC
  Administered 2011-11-29: 324 mg via ORAL
  Filled 2011-11-29: qty 4

## 2011-11-29 NOTE — ED Provider Notes (Signed)
History     CSN: [id number]  Arrival date & time 11/29/11  0101   First MD Initiated Contact with Patient 11/29/11 0122      Chief Complaint  Patient presents with  . Chest Pain    (Consider location/radiation/quality/duration/timing/severity/associated sxs/prior treatment) HPI Pt reports sudden onset of severe midsternal chest pressure/pulling about an hour prior to arrival. Non-radiating, no SOB. Has had numerous prior similar episodes with negative workup including Cath done at [facility] a few months ago showing non-obstructive, minimal disease by his report. He has a younger brother who had STEMI recently. He take NTG at home every few weeks but did not take any prior to arrival. He is also non-compliant with ASA. He has been seen several times in the last month for various pain complaints.   Past Medical History  Diagnosis Date  . Seizure disorder   . Asthma   . Hypertension   . Acute angina   . CAD (coronary artery disease)   . Seizures     Past Surgical History  Procedure Date  . Cholecystectomy   . Eye surgery   . Facial reconstruction surgery     Family History  Problem Relation Age of Onset  . Diabetes Mother   . Hypertension Mother   . Heart failure Father   . Stroke Father   . Diabetes Father   . Heart failure Brother     History  Substance Use Topics  . Smoking status: Current Everyday Smoker -- 0.5 packs/day for 20 years    Types: Cigarettes  . Smokeless tobacco: Never Used  . Alcohol Use: Yes     occasionally      Review of Systems All other systems reviewed and are negative except as noted in HPI.   Allergies  Morphine and related; Penicillins; Tramadol; and Ibuprofen  Home Medications   Current Outpatient Rx  Name Route Sig Dispense Refill  . VENTOLIN HFA IN Inhalation Inhale 2 puffs into the lungs as needed. SHORTNESS OF BREATH     . BENAZEPRIL-HYDROCHLOROTHIAZIDE 20-25 MG PO TABS Oral Take 1 tablet by mouth daily.    Marland Kitchen DIVALPROEX  SODIUM ER 500 MG PO TB24 Oral Take 1,000 mg by mouth daily at 8 pm.      . NITROGLYCERIN 0.4 MG SL SUBL Sublingual Place 0.4 mg under the tongue every 5 (five) minutes as needed.    Marland Kitchen PHENYTOIN SODIUM EXTENDED 100 MG PO CAPS Oral Take 300 mg by mouth every morning.      . TRAZODONE HCL 300 MG PO TABS Oral Take 300 mg by mouth at bedtime.    . ASPIRIN 81 MG PO TABS Oral Take 81 mg by mouth daily.    . CYCLOBENZAPRINE HCL 10 MG PO TABS Oral Take 10 mg by mouth daily at 8 pm.      . HYDROCODONE-ACETAMINOPHEN 5-325 MG PO TABS Oral Take 1 tablet by mouth once. 10 tablet 0    BP 135/92  Pulse 98  Temp 98.4 F (36.9 C) (Oral)  Resp 20  Ht 5' 10.5" (1.791 m)  Wt 245 lb (111.131 kg)  BMI 34.66 kg/m2  SpO2 97%  Physical Exam  Nursing note and vitals reviewed. Constitutional: He is oriented to person, place, and time. He appears well-developed and well-nourished.  HENT:  Head: Normocephalic and atraumatic.  Eyes: EOM are normal. Pupils are equal, round, and reactive to light.  Neck: Normal range of motion. Neck supple.  Cardiovascular: Normal rate, normal heart sounds and intact  distal pulses.   Pulmonary/Chest: Effort normal and breath sounds normal.  Abdominal: Bowel sounds are normal. He exhibits no distension. There is no tenderness.  Musculoskeletal: Normal range of motion. He exhibits no edema and no tenderness.  Neurological: He is alert and oriented to person, place, and time. He has normal strength. No cranial nerve deficit or sensory deficit.  Skin: Skin is warm and dry. No rash noted.  Psychiatric: He has a normal mood and affect.    ED Course  Procedures (including critical care time)   Labs Reviewed  CBC WITH DIFFERENTIAL  BASIC METABOLIC PANEL  TROPONIN I   No results found.   No diagnosis found.    MDM   Date: 11/29/2011  Rate: 91  Rhythm: normal sinus rhythm  QRS Axis: normal  Intervals: normal  ST/T Wave abnormalities: normal  Conduction Disutrbances:  none  Narrative Interpretation: unremarkable   3:15 AM Pt pain free now. Awaiting second troponin. Anticipate discharge if neg.   4:32 AM Second troponin is neg. Will d/c with PCP/Cardiology followup.        Charles B. Bernette Mayers, MD 11/29/11 [phone]

## 2011-11-29 NOTE — ED Notes (Signed)
Pt report onset of pain last night while standing,  States pain in center of chest that feels like "pulling" feeling

## 2011-12-04 ENCOUNTER — Emergency Department (HOSPITAL_COMMUNITY)
Admission: EM | Admit: 2011-12-04 | Discharge: 2011-12-04 | Disposition: A | Discharge status: Home / Self Care | Payer: Medicare Other | Attending: Emergency Medicine | Admitting: Emergency Medicine

## 2011-12-04 ENCOUNTER — Encounter (HOSPITAL_COMMUNITY): Payer: Self-pay | Admitting: Emergency Medicine

## 2011-12-04 DIAGNOSIS — Z880 Allergy status to penicillin: Secondary | ICD-10-CM | POA: Insufficient documentation

## 2011-12-04 DIAGNOSIS — G40909 Epilepsy, unspecified, not intractable, without status epilepticus: Secondary | ICD-10-CM | POA: Insufficient documentation

## 2011-12-04 DIAGNOSIS — R079 Chest pain, unspecified: Secondary | ICD-10-CM | POA: Insufficient documentation

## 2011-12-04 DIAGNOSIS — J45909 Unspecified asthma, uncomplicated: Secondary | ICD-10-CM | POA: Insufficient documentation

## 2011-12-04 DIAGNOSIS — I2510 Atherosclerotic heart disease of native coronary artery without angina pectoris: Secondary | ICD-10-CM | POA: Insufficient documentation

## 2011-12-04 DIAGNOSIS — F17200 Nicotine dependence, unspecified, uncomplicated: Secondary | ICD-10-CM | POA: Insufficient documentation

## 2011-12-04 DIAGNOSIS — I10 Essential (primary) hypertension: Secondary | ICD-10-CM | POA: Insufficient documentation

## 2011-12-04 LAB — POCT I-STAT TROPONIN I: Troponin i, poc: 0 ng/mL (ref 0.00–0.08)

## 2011-12-04 LAB — PHENYTOIN LEVEL, TOTAL: Phenytoin Lvl: 0 ug/mL — ABNORMAL LOW (ref 10.0–20.0)

## 2011-12-04 MED ORDER — PHENYTOIN SODIUM EXTENDED 100 MG PO CAPS
300.0000 mg | ORAL_CAPSULE | Freq: Once | ORAL | Status: AC
  Administered 2011-12-04: 300 mg via ORAL
  Filled 2011-12-04: qty 3

## 2011-12-04 MED ORDER — PHENYTOIN SODIUM EXTENDED 100 MG PO CAPS: 300.0000 mg | ORAL_CAPSULE | Freq: Every day | ORAL | Status: DC

## 2011-12-04 MED ORDER — DIVALPROEX SODIUM ER 500 MG PO TB24: 500.0000 mg | ORAL_TABLET | Freq: Every day | ORAL | Status: DC

## 2011-12-04 NOTE — ED Notes (Signed)
Hold patient until cath report available from [facility].

## 2011-12-04 NOTE — ED Provider Notes (Signed)
History     CSN: [id number]  Arrival date & time 12/04/11  0255   First MD Initiated Contact with Patient 12/04/11 0258      Chief Complaint  Patient presents with  . Chest Pain    (Consider location/radiation/quality/duration/timing/severity/associated sxs/prior treatment) HPI Comments: 31 year old male with a history of tobacco use, hypertension, seizures who presents with a complaint of chest pain. He states that he has been experiencing this pain on and off for the last week. He states that it comes on when he gets stressed out or upset. He has had increased arguments with his spouse over the last several days and has had pain that comes on when he argues with his wife. Today he went out to his truck parked outside which does not run (another source of stress), and had chest pain while he was sitting down. Over the last several hours this pain is gradually eased off and currently it is very mild. He describes it as a pain in the middle of his chest which does not radiate and is occasionally associated with nausea. He had a cardiac catheterization per his report this year at another hospital. He was seen in the emergency department several days ago for chest pain and had a negative workup at that time.  Patient is a 31 y.o. male presenting with chest pain. The history is provided by the patient, medical records and the EMS personnel.  Chest Pain     Past Medical History  Diagnosis Date  . Seizure disorder   . Asthma   . Hypertension   . Acute angina   . CAD (coronary artery disease)   . Seizures     Past Surgical History  Procedure Date  . Cholecystectomy   . Eye surgery   . Facial reconstruction surgery     Family History  Problem Relation Age of Onset  . Diabetes Mother   . Hypertension Mother   . Heart failure Father   . Stroke Father   . Diabetes Father   . Heart failure Brother     History  Substance Use Topics  . Smoking status: Current Everyday Smoker --  0.5 packs/day for 20 years    Types: Cigarettes  . Smokeless tobacco: Never Used  . Alcohol Use: Yes     occasionally      Review of Systems  Cardiovascular: Positive for chest pain.  All other systems reviewed and are negative.    Allergies  Morphine and related; Penicillins; Tramadol; and Ibuprofen  Home Medications   Current Outpatient Rx  Name Route Sig Dispense Refill  . VENTOLIN HFA IN Inhalation Inhale 2 puffs into the lungs as needed. SHORTNESS OF BREATH     . ASPIRIN 81 MG PO TABS Oral Take 81 mg by mouth daily.    Marland Kitchen BENAZEPRIL-HYDROCHLOROTHIAZIDE 20-25 MG PO TABS Oral Take 1 tablet by mouth daily.    . CYCLOBENZAPRINE HCL 10 MG PO TABS Oral Take 10 mg by mouth daily at 8 pm.      . DIVALPROEX SODIUM ER 500 MG PO TB24 Oral Take 1 tablet (500 mg total) by mouth at bedtime. 30 tablet 1  . DIVALPROEX SODIUM ER 500 MG PO TB24 Oral Take 1,000 mg by mouth daily at 8 pm.      . HYDROCODONE-ACETAMINOPHEN 5-325 MG PO TABS Oral Take 1 tablet by mouth once. 10 tablet 0  . NITROGLYCERIN 0.4 MG SL SUBL Sublingual Place 0.4 mg under the tongue every 5 (five) minutes  as needed.    Marland Kitchen PHENYTOIN SODIUM EXTENDED 100 MG PO CAPS Oral Take 300 mg by mouth every morning.      Marland Kitchen PHENYTOIN SODIUM EXTENDED 100 MG PO CAPS Oral Take 3 capsules (300 mg total) by mouth daily. 90 capsule 1  . TRAZODONE HCL 300 MG PO TABS Oral Take 300 mg by mouth at bedtime.      BP 121/74  Pulse 71  Temp 98.5 F (36.9 C) (Oral)  Resp 18  Ht 5' 10.5" (1.791 m)  Wt 245 lb (111.131 kg)  BMI 34.66 kg/m2  SpO2 97%  Physical Exam  Nursing note and vitals reviewed. Constitutional: He appears well-developed and well-nourished. No distress.  HENT:  Head: Normocephalic and atraumatic.  Mouth/Throat: Oropharynx is clear and moist. No oropharyngeal exudate.  Eyes: Conjunctivae and EOM are normal. Pupils are equal, round, and reactive to light. Right eye exhibits no discharge. Left eye exhibits no discharge. No  scleral icterus.  Neck: Normal range of motion. Neck supple. No JVD present. No thyromegaly present.  Cardiovascular: Normal rate, regular rhythm, normal heart sounds and intact distal pulses.  Exam reveals no gallop and no friction rub.   No murmur heard. Pulmonary/Chest: Effort normal and breath sounds normal. No respiratory distress. He has no wheezes. He has no rales.  Abdominal: Soft. Bowel sounds are normal. He exhibits no distension and no mass. There is no tenderness.  Musculoskeletal: Normal range of motion. He exhibits no edema and no tenderness.  Lymphadenopathy:    He has no cervical adenopathy.  Neurological: He is alert. Coordination normal.  Skin: Skin is warm and dry. No rash noted. No erythema.  Psychiatric: He has a normal mood and affect. His behavior is normal.    ED Course  Procedures (including critical care time)  Labs Reviewed  PHENYTOIN LEVEL, TOTAL - Abnormal; Notable for the following:    Phenytoin Lvl 0.0 (*)     All other components within normal limits  POCT I-STAT TROPONIN I   No results found.   1. Chest pain       MDM  The patient appears nontoxic with normal vital signs and an EKG which is nonischemic. We'll obtain records from outside hospital, check troponin and Dilantin level.   ED ECG REPORT  I personally interpreted this EKG   Date: 12/04/2011   Rate: 81  Rhythm: normal sinus rhythm  QRS Axis: normal  Intervals: normal  ST/T Wave abnormalities: normal  Conduction Disutrbances:none  Narrative Interpretation:   Old EKG Reviewed: Unchanged compared to November 29 2011  Blood work reviewed and shows Dilantin is not present - pt questioned and states is out of dilantin and depakote - I have given him his dilantin here and refilled both Rx for outpatient use.  His trop is negative, old ECG is reviewed and cardiac cath report from 09/03/11 obtained by fax from [facility] showing 25% or less lesions in CA's.  He has no obstructive lesions.  I don't  believe his sx are related to obstructions this evening.  Will d/c home and can f/u with outpt family doctor.  VS have been normal.  Pt comfortable and CP free on d/c.  Discharge Prescriptions include:  Dilantin  Depakote  Vida Roller, MD 12/04/11 [phone]

## 2011-12-04 NOTE — ED Notes (Signed)
States he started having chest pain approx 4 days ago.  States he has had some nausea.  States he does not have chest pain until he gets really stressed.  States that he took a nitroglycerin at home prior to EMS arrival, states this helped his pain somewhat.

## 2011-12-05 NOTE — ED Provider Notes (Signed)
Medical screening examination/treatment/procedure(s) were performed by non-physician practitioner and as supervising physician I was immediately available for consultation/collaboration.  Deah Ottaway S. Joliyah Lippens, MD 12/05/11 2050 

## 2011-12-20 DIAGNOSIS — I10 Essential (primary) hypertension: Secondary | ICD-10-CM | POA: Diagnosis not present

## 2012-01-22 DIAGNOSIS — F17200 Nicotine dependence, unspecified, uncomplicated: Secondary | ICD-10-CM | POA: Diagnosis not present

## 2012-01-22 DIAGNOSIS — R21 Rash and other nonspecific skin eruption: Secondary | ICD-10-CM | POA: Diagnosis not present

## 2012-01-22 DIAGNOSIS — Z7982 Long term (current) use of aspirin: Secondary | ICD-10-CM | POA: Diagnosis not present

## 2012-01-22 DIAGNOSIS — Z79899 Other long term (current) drug therapy: Secondary | ICD-10-CM | POA: Diagnosis not present

## 2012-02-07 DIAGNOSIS — R079 Chest pain, unspecified: Secondary | ICD-10-CM | POA: Diagnosis not present

## 2012-02-07 DIAGNOSIS — F17200 Nicotine dependence, unspecified, uncomplicated: Secondary | ICD-10-CM | POA: Diagnosis not present

## 2012-02-07 DIAGNOSIS — M549 Dorsalgia, unspecified: Secondary | ICD-10-CM | POA: Diagnosis not present

## 2012-02-07 DIAGNOSIS — M79609 Pain in unspecified limb: Secondary | ICD-10-CM | POA: Diagnosis not present

## 2012-02-07 DIAGNOSIS — J45909 Unspecified asthma, uncomplicated: Secondary | ICD-10-CM | POA: Diagnosis not present

## 2012-02-07 DIAGNOSIS — Z7982 Long term (current) use of aspirin: Secondary | ICD-10-CM | POA: Diagnosis not present

## 2012-02-07 DIAGNOSIS — R0789 Other chest pain: Secondary | ICD-10-CM | POA: Diagnosis not present

## 2012-02-07 DIAGNOSIS — Z79899 Other long term (current) drug therapy: Secondary | ICD-10-CM | POA: Diagnosis not present

## 2012-02-07 DIAGNOSIS — I10 Essential (primary) hypertension: Secondary | ICD-10-CM | POA: Diagnosis not present

## 2012-02-07 DIAGNOSIS — K219 Gastro-esophageal reflux disease without esophagitis: Secondary | ICD-10-CM | POA: Diagnosis not present

## 2012-03-20 DIAGNOSIS — I10 Essential (primary) hypertension: Secondary | ICD-10-CM | POA: Diagnosis not present

## 2012-04-05 DIAGNOSIS — Z79899 Other long term (current) drug therapy: Secondary | ICD-10-CM | POA: Diagnosis not present

## 2012-04-05 DIAGNOSIS — L03119 Cellulitis of unspecified part of limb: Secondary | ICD-10-CM | POA: Diagnosis not present

## 2012-04-05 DIAGNOSIS — Z7982 Long term (current) use of aspirin: Secondary | ICD-10-CM | POA: Diagnosis not present

## 2012-04-05 DIAGNOSIS — F17200 Nicotine dependence, unspecified, uncomplicated: Secondary | ICD-10-CM | POA: Diagnosis not present

## 2012-04-05 DIAGNOSIS — I10 Essential (primary) hypertension: Secondary | ICD-10-CM | POA: Diagnosis not present

## 2012-04-05 DIAGNOSIS — L02419 Cutaneous abscess of limb, unspecified: Secondary | ICD-10-CM | POA: Diagnosis not present

## 2012-04-08 DIAGNOSIS — Z79899 Other long term (current) drug therapy: Secondary | ICD-10-CM | POA: Diagnosis not present

## 2012-04-08 DIAGNOSIS — F17200 Nicotine dependence, unspecified, uncomplicated: Secondary | ICD-10-CM | POA: Diagnosis not present

## 2012-04-08 DIAGNOSIS — Z4800 Encounter for change or removal of nonsurgical wound dressing: Secondary | ICD-10-CM | POA: Diagnosis not present

## 2012-04-08 DIAGNOSIS — Z7982 Long term (current) use of aspirin: Secondary | ICD-10-CM | POA: Diagnosis not present

## 2012-04-08 DIAGNOSIS — L02419 Cutaneous abscess of limb, unspecified: Secondary | ICD-10-CM | POA: Diagnosis not present

## 2012-04-08 DIAGNOSIS — I10 Essential (primary) hypertension: Secondary | ICD-10-CM | POA: Diagnosis not present

## 2012-04-08 DIAGNOSIS — Z880 Allergy status to penicillin: Secondary | ICD-10-CM | POA: Diagnosis not present

## 2012-04-08 DIAGNOSIS — Z886 Allergy status to analgesic agent status: Secondary | ICD-10-CM | POA: Diagnosis not present

## 2012-04-26 DIAGNOSIS — R6889 Other general symptoms and signs: Secondary | ICD-10-CM | POA: Diagnosis not present

## 2012-06-22 DIAGNOSIS — Z886 Allergy status to analgesic agent status: Secondary | ICD-10-CM | POA: Diagnosis not present

## 2012-06-22 DIAGNOSIS — F17200 Nicotine dependence, unspecified, uncomplicated: Secondary | ICD-10-CM | POA: Diagnosis not present

## 2012-06-22 DIAGNOSIS — Z0000 Encounter for general adult medical examination without abnormal findings: Secondary | ICD-10-CM | POA: Diagnosis not present

## 2012-06-22 DIAGNOSIS — I2510 Atherosclerotic heart disease of native coronary artery without angina pectoris: Secondary | ICD-10-CM | POA: Diagnosis not present

## 2012-06-22 DIAGNOSIS — Z885 Allergy status to narcotic agent status: Secondary | ICD-10-CM | POA: Diagnosis not present

## 2012-06-22 DIAGNOSIS — Z136 Encounter for screening for cardiovascular disorders: Secondary | ICD-10-CM | POA: Diagnosis not present

## 2012-06-22 DIAGNOSIS — J45909 Unspecified asthma, uncomplicated: Secondary | ICD-10-CM | POA: Diagnosis not present

## 2012-06-22 DIAGNOSIS — Z7982 Long term (current) use of aspirin: Secondary | ICD-10-CM | POA: Diagnosis not present

## 2012-06-22 DIAGNOSIS — Z880 Allergy status to penicillin: Secondary | ICD-10-CM | POA: Diagnosis not present

## 2012-06-22 DIAGNOSIS — Z79899 Other long term (current) drug therapy: Secondary | ICD-10-CM | POA: Diagnosis not present

## 2012-06-22 DIAGNOSIS — Z8249 Family history of ischemic heart disease and other diseases of the circulatory system: Secondary | ICD-10-CM | POA: Diagnosis not present

## 2012-06-22 DIAGNOSIS — G40909 Epilepsy, unspecified, not intractable, without status epilepticus: Secondary | ICD-10-CM | POA: Diagnosis not present

## 2012-06-22 DIAGNOSIS — I10 Essential (primary) hypertension: Secondary | ICD-10-CM | POA: Diagnosis not present

## 2012-06-22 DIAGNOSIS — R079 Chest pain, unspecified: Secondary | ICD-10-CM | POA: Diagnosis not present

## 2012-06-23 DIAGNOSIS — I10 Essential (primary) hypertension: Secondary | ICD-10-CM | POA: Diagnosis not present

## 2012-06-23 DIAGNOSIS — I2510 Atherosclerotic heart disease of native coronary artery without angina pectoris: Secondary | ICD-10-CM | POA: Diagnosis not present

## 2012-06-23 DIAGNOSIS — G40909 Epilepsy, unspecified, not intractable, without status epilepticus: Secondary | ICD-10-CM | POA: Diagnosis not present

## 2012-06-23 DIAGNOSIS — J45909 Unspecified asthma, uncomplicated: Secondary | ICD-10-CM | POA: Diagnosis not present

## 2012-06-23 DIAGNOSIS — R079 Chest pain, unspecified: Secondary | ICD-10-CM | POA: Diagnosis not present

## 2012-07-07 DIAGNOSIS — R569 Unspecified convulsions: Secondary | ICD-10-CM | POA: Diagnosis not present

## 2012-07-07 DIAGNOSIS — Z8249 Family history of ischemic heart disease and other diseases of the circulatory system: Secondary | ICD-10-CM | POA: Diagnosis not present

## 2012-07-07 DIAGNOSIS — I2510 Atherosclerotic heart disease of native coronary artery without angina pectoris: Secondary | ICD-10-CM | POA: Diagnosis not present

## 2012-07-07 DIAGNOSIS — Z23 Encounter for immunization: Secondary | ICD-10-CM | POA: Diagnosis not present

## 2012-07-07 DIAGNOSIS — Z79899 Other long term (current) drug therapy: Secondary | ICD-10-CM | POA: Diagnosis not present

## 2012-07-07 DIAGNOSIS — F17200 Nicotine dependence, unspecified, uncomplicated: Secondary | ICD-10-CM | POA: Diagnosis not present

## 2012-07-07 DIAGNOSIS — I10 Essential (primary) hypertension: Secondary | ICD-10-CM | POA: Diagnosis not present

## 2012-07-07 DIAGNOSIS — J45909 Unspecified asthma, uncomplicated: Secondary | ICD-10-CM | POA: Diagnosis not present

## 2012-07-07 DIAGNOSIS — R079 Chest pain, unspecified: Secondary | ICD-10-CM | POA: Diagnosis not present

## 2012-07-07 DIAGNOSIS — Z7982 Long term (current) use of aspirin: Secondary | ICD-10-CM | POA: Diagnosis not present

## 2012-07-08 DIAGNOSIS — R079 Chest pain, unspecified: Secondary | ICD-10-CM | POA: Diagnosis not present

## 2012-07-08 DIAGNOSIS — J45909 Unspecified asthma, uncomplicated: Secondary | ICD-10-CM | POA: Diagnosis not present

## 2012-07-08 DIAGNOSIS — I10 Essential (primary) hypertension: Secondary | ICD-10-CM | POA: Diagnosis not present

## 2012-07-08 DIAGNOSIS — F17200 Nicotine dependence, unspecified, uncomplicated: Secondary | ICD-10-CM | POA: Diagnosis not present

## 2012-07-08 DIAGNOSIS — I2510 Atherosclerotic heart disease of native coronary artery without angina pectoris: Secondary | ICD-10-CM | POA: Diagnosis not present

## 2012-07-08 DIAGNOSIS — R569 Unspecified convulsions: Secondary | ICD-10-CM | POA: Diagnosis not present

## 2012-07-10 DIAGNOSIS — R079 Chest pain, unspecified: Secondary | ICD-10-CM | POA: Diagnosis not present

## 2012-07-24 DIAGNOSIS — I209 Angina pectoris, unspecified: Secondary | ICD-10-CM | POA: Diagnosis not present

## 2012-07-29 DIAGNOSIS — Z7982 Long term (current) use of aspirin: Secondary | ICD-10-CM | POA: Diagnosis not present

## 2012-07-29 DIAGNOSIS — F17200 Nicotine dependence, unspecified, uncomplicated: Secondary | ICD-10-CM | POA: Diagnosis not present

## 2012-07-29 DIAGNOSIS — Z79899 Other long term (current) drug therapy: Secondary | ICD-10-CM | POA: Diagnosis not present

## 2012-07-29 DIAGNOSIS — I10 Essential (primary) hypertension: Secondary | ICD-10-CM | POA: Diagnosis not present

## 2012-07-29 DIAGNOSIS — S60229A Contusion of unspecified hand, initial encounter: Secondary | ICD-10-CM | POA: Diagnosis not present

## 2012-07-29 DIAGNOSIS — R209 Unspecified disturbances of skin sensation: Secondary | ICD-10-CM | POA: Diagnosis not present

## 2012-07-29 DIAGNOSIS — M79609 Pain in unspecified limb: Secondary | ICD-10-CM | POA: Diagnosis not present

## 2012-09-13 ENCOUNTER — Emergency Department (HOSPITAL_COMMUNITY)
Admission: EM | Admit: 2012-09-13 | Discharge: 2012-09-14 | Disposition: A | Discharge status: Home / Self Care | Payer: Medicare Other | Attending: Emergency Medicine | Admitting: Emergency Medicine

## 2012-09-13 ENCOUNTER — Encounter (HOSPITAL_COMMUNITY): Payer: Self-pay | Admitting: *Deleted

## 2012-09-13 DIAGNOSIS — I10 Essential (primary) hypertension: Secondary | ICD-10-CM | POA: Diagnosis not present

## 2012-09-13 DIAGNOSIS — I2510 Atherosclerotic heart disease of native coronary artery without angina pectoris: Secondary | ICD-10-CM | POA: Insufficient documentation

## 2012-09-13 DIAGNOSIS — Z880 Allergy status to penicillin: Secondary | ICD-10-CM | POA: Diagnosis not present

## 2012-09-13 DIAGNOSIS — F17200 Nicotine dependence, unspecified, uncomplicated: Secondary | ICD-10-CM | POA: Diagnosis not present

## 2012-09-13 DIAGNOSIS — Z79899 Other long term (current) drug therapy: Secondary | ICD-10-CM | POA: Insufficient documentation

## 2012-09-13 DIAGNOSIS — Z8669 Personal history of other diseases of the nervous system and sense organs: Secondary | ICD-10-CM | POA: Insufficient documentation

## 2012-09-13 DIAGNOSIS — J45909 Unspecified asthma, uncomplicated: Secondary | ICD-10-CM | POA: Diagnosis not present

## 2012-09-13 DIAGNOSIS — Z8679 Personal history of other diseases of the circulatory system: Secondary | ICD-10-CM | POA: Insufficient documentation

## 2012-09-13 DIAGNOSIS — R079 Chest pain, unspecified: Secondary | ICD-10-CM | POA: Diagnosis not present

## 2012-09-13 DIAGNOSIS — R072 Precordial pain: Secondary | ICD-10-CM | POA: Diagnosis not present

## 2012-09-13 MED ORDER — IPRATROPIUM BROMIDE 0.02 % IN SOLN
0.5000 mg | Freq: Once | RESPIRATORY_TRACT | Status: AC
  Administered 2012-09-14: 0.5 mg via RESPIRATORY_TRACT
  Filled 2012-09-13: qty 2.5

## 2012-09-13 MED ORDER — ALBUTEROL SULFATE (5 MG/ML) 0.5% IN NEBU
2.5000 mg | INHALATION_SOLUTION | Freq: Once | RESPIRATORY_TRACT | Status: AC
  Administered 2012-09-14: 2.5 mg via RESPIRATORY_TRACT
  Filled 2012-09-13: qty 0.5

## 2012-09-13 NOTE — ED Provider Notes (Signed)
History  This chart was scribed for [employer]. Colon Branch, MD by Bennett Scrape, ED Scribe. This patient was seen in room [facility]05/[facility] and the patient's care was started at 11:34 PM.  CSN: [id number]  Arrival date & time 09/13/12  2235   None     Chief Complaint  Patient presents with  . Chest Pain    The history is provided by the patient. No language interpreter was used.   HPI Comments: Chad Hampton is a 32 y.o. male who presents to the Emergency Department complaining of 5 to 6 months of intermittent mid-sternal chest pain which became worse as he was sleeping this evening. Pt describes the pain as "squeezing" and non-radiating. He rates the current level of his chest pain as a 4/10 and is worse with touch. He denies any recent falls or trauma. Pt reports having h/o of two 50% heart blockages, but denies stents. He reports an occasional cough but denies diaphoresis, nausea and palpations. Pt reports that he has a nebulizer at home but hasn't used it today.   Past Medical History  Diagnosis Date  . Seizure disorder   . Asthma   . Hypertension   . Acute angina   . CAD (coronary artery disease)   . Seizures     Past Surgical History  Procedure Laterality Date  . Cholecystectomy    . Eye surgery    . Facial reconstruction surgery      Family History  Problem Relation Age of Onset  . Diabetes Mother   . Hypertension Mother   . Heart failure Father   . Stroke Father   . Diabetes Father   . Heart failure Brother     History  Substance Use Topics  . Smoking status: Current Every Day Smoker -- 0.50 packs/day for 20 years    Types: Cigarettes  . Smokeless tobacco: Never Used  . Alcohol Use: Yes     Comment: occasionally      Review of Systems  Constitutional: Negative for fever.       10 systems reviewed and are negative for acute change except as noted in the HPI  HENT: Negative for congestion.   Eyes: Negative for discharge and redness.  Respiratory: Negative for  cough and shortness of breath.   Cardiovascular: Positive for chest pain.  Gastrointestinal: Negative for vomiting and abdominal pain.  Musculoskeletal: Negative for back pain.  Skin: Negative for rash.  Neurological: Negative for syncope, numbness and headaches.  Psychiatric/Behavioral:       No behavior change    Allergies  Morphine and related; Penicillins; Tramadol; and Ibuprofen  Home Medications   Current Outpatient Rx  Name  Route  Sig  Dispense  Refill  . albuterol (PROVENTIL) (2.5 MG/3ML) 0.083% nebulizer solution   Nebulization   Take 2.5 mg by nebulization every 6 (six) hours as needed for wheezing or shortness of breath.         Marland Kitchen aspirin 81 MG tablet   Oral   Take 81 mg by mouth daily.         . benazepril-hydrochlorthiazide (LOTENSIN HCT) 20-25 MG per tablet   Oral   Take 1 tablet by mouth daily.         . cyclobenzaprine (FLEXERIL) 10 MG tablet   Oral   Take 10 mg by mouth daily as needed for muscle spasms.          . divalproex (DEPAKOTE ER) 500 MG 24 hr tablet   Oral  Take 1 tablet (500 mg total) by mouth at bedtime.   30 tablet   1   . nitroGLYCERIN (NITROSTAT) 0.4 MG SL tablet   Sublingual   Place 0.4 mg under the tongue every 5 (five) minutes as needed.         . phenytoin (DILANTIN) 100 MG ER capsule   Oral   Take 300 mg by mouth every morning.             Triage Vitals: BP 157/98  Pulse 81  Temp(Src) 98.7 F (37.1 C) (Oral)  Resp 13  Ht 5\' 10"  (1.778 m)  Wt 269 lb (122.018 kg)  BMI 38.6 kg/m2  SpO2 97%  Physical Exam  Nursing note and vitals reviewed. Constitutional: He is oriented to person, place, and time. He appears well-developed and well-nourished. No distress.  HENT:  Head: Normocephalic and atraumatic.  Eyes: EOM are normal.  Neck: Neck supple. No tracheal deviation present.  Cardiovascular: Normal rate.   Pulmonary/Chest: Effort normal. No respiratory distress. He has wheezes.  Musculoskeletal: Normal  range of motion.  Neurological: He is alert and oriented to person, place, and time.  Skin: Skin is warm and dry.  Psychiatric: He has a normal mood and affect. His behavior is normal.    ED Course  Procedures (including critical care time) Results for orders placed during the hospital encounter of 09/13/12  POCT I-STAT, CHEM 8      Result Value Range   Sodium 144  135 - 145 mEq/L   Potassium 3.9  3.5 - 5.1 mEq/L   Chloride 108  96 - 112 mEq/L   BUN 5 (*) 6 - 23 mg/dL   Creatinine, Ser 1.61  0.50 - 1.35 mg/dL   Glucose, Bld 99  70 - 99 mg/dL   Calcium, Ion 0.96  0.45 - 1.23 mmol/L   TCO2 27  0 - 100 mmol/L   Hemoglobin 15.6  13.0 - 17.0 g/dL   HCT 40.9  81.1 - 91.4 %  POCT I-STAT TROPONIN I      Result Value Range   Troponin i, poc 0.00  0.00 - 0.08 ng/mL   Comment 3              Date: 09/14/2012  Rate: 75  Rhythm: normal sinus rhythm with sinus arrhythmia  QRS Axis: normal  Intervals: normal  ST/T Wave abnormalities: normal  Conduction Disutrbances: none  Narrative Interpretation: unremarkable   DIAGNOSTIC STUDIES: Oxygen Saturation is 97% on room air, normal by my interpretation.    COORDINATION OF CARE:  11:09 PM-Discussed treatment plan with pt which included labs and  a nebulizer treatment.      MDM  Patient with 5-6 months of chest pain that had increased chest pain this evening. Exam with wheezing. Given albuterol/atrovent nebulizer with relief. Labs and troponin normal. Pt stable in ED with no significant deterioration in condition.The patient appears reasonably screened and/or stabilized for discharge and I doubt any other medical condition or other [facility] requiring further screening, evaluation, or treatment in the ED at this time prior to discharge.  Pt stable in ED with no significant deterioration in condition.The patient appears reasonably screened and/or stabilized for discharge and I doubt any other medical condition or other [facility] requiring further  screening, evaluation, or treatment in the ED at this time prior to discharge.  MDM Reviewed: nursing note and vitals Interpretation: labs and ECG         Nicoletta Dress. Colon Branch, MD 09/14/12 [id number]

## 2012-09-13 NOTE — ED Notes (Signed)
Pt c/o chest pain x 5-6 months. Pt states it got worse yesterday. Chest hurts upon palpitation.

## 2012-09-13 NOTE — ED Notes (Signed)
Patient states pain has reduced to a 4.

## 2012-09-14 LAB — POCT I-STAT, CHEM 8
BUN: 5 mg/dL — ABNORMAL LOW (ref 6–23)
Potassium: 3.9 mEq/L (ref 3.5–5.1)
Sodium: 144 mEq/L (ref 135–145)
TCO2: 27 mmol/L (ref 0–100)

## 2012-09-14 LAB — POCT I-STAT TROPONIN I: Troponin i, poc: 0 ng/mL (ref 0.00–0.08)

## 2012-09-21 ENCOUNTER — Emergency Department (HOSPITAL_COMMUNITY)
Admission: EM | Admit: 2012-09-21 | Discharge: 2012-09-21 | Disposition: A | Discharge status: Home / Self Care | Payer: Medicare Other | Attending: Emergency Medicine | Admitting: Emergency Medicine

## 2012-09-21 ENCOUNTER — Encounter (HOSPITAL_COMMUNITY): Payer: Self-pay | Admitting: Emergency Medicine

## 2012-09-21 DIAGNOSIS — J45901 Unspecified asthma with (acute) exacerbation: Secondary | ICD-10-CM | POA: Diagnosis not present

## 2012-09-21 DIAGNOSIS — Z880 Allergy status to penicillin: Secondary | ICD-10-CM | POA: Diagnosis not present

## 2012-09-21 DIAGNOSIS — J069 Acute upper respiratory infection, unspecified: Secondary | ICD-10-CM | POA: Diagnosis not present

## 2012-09-21 DIAGNOSIS — I209 Angina pectoris, unspecified: Secondary | ICD-10-CM | POA: Diagnosis not present

## 2012-09-21 DIAGNOSIS — J3489 Other specified disorders of nose and nasal sinuses: Secondary | ICD-10-CM | POA: Diagnosis not present

## 2012-09-21 DIAGNOSIS — I2510 Atherosclerotic heart disease of native coronary artery without angina pectoris: Secondary | ICD-10-CM | POA: Diagnosis not present

## 2012-09-21 DIAGNOSIS — I10 Essential (primary) hypertension: Secondary | ICD-10-CM | POA: Diagnosis not present

## 2012-09-21 DIAGNOSIS — F17200 Nicotine dependence, unspecified, uncomplicated: Secondary | ICD-10-CM | POA: Insufficient documentation

## 2012-09-21 DIAGNOSIS — G40909 Epilepsy, unspecified, not intractable, without status epilepticus: Secondary | ICD-10-CM | POA: Insufficient documentation

## 2012-09-21 DIAGNOSIS — Z79899 Other long term (current) drug therapy: Secondary | ICD-10-CM | POA: Insufficient documentation

## 2012-09-21 DIAGNOSIS — Z7982 Long term (current) use of aspirin: Secondary | ICD-10-CM | POA: Insufficient documentation

## 2012-09-21 DIAGNOSIS — J209 Acute bronchitis, unspecified: Secondary | ICD-10-CM | POA: Diagnosis not present

## 2012-09-21 DIAGNOSIS — J40 Bronchitis, not specified as acute or chronic: Secondary | ICD-10-CM

## 2012-09-21 DIAGNOSIS — R0602 Shortness of breath: Secondary | ICD-10-CM | POA: Diagnosis not present

## 2012-09-21 MED ORDER — PREDNISONE 10 MG PO TABS
ORAL_TABLET | ORAL | Status: AC
  Filled 2012-09-21: qty 1

## 2012-09-21 MED ORDER — ALBUTEROL SULFATE HFA 108 (90 BASE) MCG/ACT IN AERS
2.0000 | INHALATION_SPRAY | Freq: Once | RESPIRATORY_TRACT | Status: AC
  Administered 2012-09-21: 2 via RESPIRATORY_TRACT
  Filled 2012-09-21: qty 6.7

## 2012-09-21 MED ORDER — PREDNISONE 50 MG PO TABS
60.0000 mg | ORAL_TABLET | Freq: Once | ORAL | Status: AC
  Administered 2012-09-21: 60 mg via ORAL
  Filled 2012-09-21: qty 1

## 2012-09-21 MED ORDER — PREDNISONE 50 MG PO TABS: ORAL_TABLET | ORAL | Status: DC

## 2012-09-21 NOTE — ED Provider Notes (Signed)
History     CSN: [id number]  Arrival date & time 09/21/12  0220   First MD Initiated Contact with Patient 09/21/12 0235      Chief Complaint  Patient presents with  . Cough  . URI     Patient is a 32 y.o. male presenting with cough. The history is provided by the patient.  Cough Cough characteristics:  Productive Sputum characteristics:  Green Severity:  Moderate Onset quality:  Gradual Duration:  2 days Timing:  Intermittent Progression:  Worsening Chronicity:  New Smoker: yes   Relieved by:  Nothing Associated symptoms: shortness of breath, sinus congestion and wheezing   Associated symptoms: no chest pain and no fever     Past Medical History  Diagnosis Date  . Seizure disorder   . Asthma   . Hypertension   . Acute angina   . CAD (coronary artery disease)   . Seizures     Past Surgical History  Procedure Laterality Date  . Cholecystectomy    . Eye surgery    . Facial reconstruction surgery      Family History  Problem Relation Age of Onset  . Diabetes Mother   . Hypertension Mother   . Heart failure Father   . Stroke Father   . Diabetes Father   . Heart failure Brother     History  Substance Use Topics  . Smoking status: Current Every Day Smoker -- 0.50 packs/day for 20 years    Types: Cigarettes  . Smokeless tobacco: Never Used  . Alcohol Use: Yes     Comment: occasionally      Review of Systems  Constitutional: Negative for fever.  Respiratory: Positive for cough, shortness of breath and wheezing.   Cardiovascular: Negative for chest pain.  Gastrointestinal: Negative for vomiting.    Allergies  Morphine and related; Penicillins; Tramadol; and Ibuprofen  Home Medications   Current Outpatient Rx  Name  Route  Sig  Dispense  Refill  . albuterol (PROVENTIL) (2.5 MG/3ML) 0.083% nebulizer solution   Nebulization   Take 2.5 mg by nebulization every 6 (six) hours as needed for wheezing or shortness of breath.         Marland Kitchen aspirin 81  MG tablet   Oral   Take 81 mg by mouth daily.         . benazepril-hydrochlorthiazide (LOTENSIN HCT) 20-25 MG per tablet   Oral   Take 1 tablet by mouth daily.         . cyclobenzaprine (FLEXERIL) 10 MG tablet   Oral   Take 10 mg by mouth daily as needed for muscle spasms.          . divalproex (DEPAKOTE ER) 500 MG 24 hr tablet   Oral   Take 1 tablet (500 mg total) by mouth at bedtime.   30 tablet   1   . nitroGLYCERIN (NITROSTAT) 0.4 MG SL tablet   Sublingual   Place 0.4 mg under the tongue every 5 (five) minutes as needed.         . phenytoin (DILANTIN) 100 MG ER capsule   Oral   Take 300 mg by mouth every morning.           . predniSONE (DELTASONE) 50 MG tablet      One tablet PO daily for 4 days   4 tablet   0     BP 130/82  Pulse 86  Temp(Src) 97.5 F (36.4 C) (Oral)  Resp 18  Ht  5' 10.5" (1.791 m)  Wt 248 lb (112.492 kg)  BMI 35.07 kg/m2  SpO2 97%  Physical Exam CONSTITUTIONAL: Well developed/well nourished HEAD: Normocephalic/atraumatic EYES: EOMI/PERRL ENMT: Mucous membranes moist, nasal congestion noted NECK: supple no meningeal signs SPINE:entire spine nontender CV: S1/S2 noted, no murmurs/rubs/gallops noted LUNGS:wheezing noted bilaterally.  No rales noted.  No tachypnea/distress noted.   ABDOMEN: soft, nontender, no rebound or guarding GU:no cva tenderness NEURO: Pt is awake/alert, moves all extremitiesx4 EXTREMITIES: pulses normal, full ROM, no LE edema noted SKIN: warm, color normal PSYCH: no abnormalities of mood noted  ED Course  Procedures 1. Bronchitis    Pt with h/o asthma (he is a smoker) with cough/congestion/wheeze Suspect acute bronchitic I doubt ACS/PE/CHF at this time. I doubt pneumonia at this time I told him to quit smoking Stable for d/c   MDM  Nursing notes including past medical history and social history reviewed and considered in documentation         Joya Gaskins, MD 09/21/12 [phone]

## 2012-09-21 NOTE — ED Notes (Signed)
Patient complains of productive cough with Chad Hampton mucus, cold-like symptoms, and congestion x 2 days.

## 2012-09-27 ENCOUNTER — Emergency Department (HOSPITAL_COMMUNITY)
Admission: EM | Admit: 2012-09-27 | Discharge: 2012-09-27 | Disposition: A | Discharge status: Home / Self Care | Payer: Medicare Other | Attending: Emergency Medicine | Admitting: Emergency Medicine

## 2012-09-27 ENCOUNTER — Encounter (HOSPITAL_COMMUNITY): Payer: Self-pay | Admitting: *Deleted

## 2012-09-27 DIAGNOSIS — H65199 Other acute nonsuppurative otitis media, unspecified ear: Secondary | ICD-10-CM | POA: Diagnosis not present

## 2012-09-27 DIAGNOSIS — F17200 Nicotine dependence, unspecified, uncomplicated: Secondary | ICD-10-CM | POA: Diagnosis not present

## 2012-09-27 DIAGNOSIS — J45901 Unspecified asthma with (acute) exacerbation: Secondary | ICD-10-CM | POA: Diagnosis not present

## 2012-09-27 DIAGNOSIS — Z7982 Long term (current) use of aspirin: Secondary | ICD-10-CM | POA: Insufficient documentation

## 2012-09-27 DIAGNOSIS — R059 Cough, unspecified: Secondary | ICD-10-CM | POA: Diagnosis not present

## 2012-09-27 DIAGNOSIS — I2510 Atherosclerotic heart disease of native coronary artery without angina pectoris: Secondary | ICD-10-CM | POA: Insufficient documentation

## 2012-09-27 DIAGNOSIS — H9209 Otalgia, unspecified ear: Secondary | ICD-10-CM | POA: Insufficient documentation

## 2012-09-27 DIAGNOSIS — Z79899 Other long term (current) drug therapy: Secondary | ICD-10-CM | POA: Insufficient documentation

## 2012-09-27 DIAGNOSIS — R093 Abnormal sputum: Secondary | ICD-10-CM | POA: Diagnosis not present

## 2012-09-27 DIAGNOSIS — G40909 Epilepsy, unspecified, not intractable, without status epilepticus: Secondary | ICD-10-CM | POA: Diagnosis not present

## 2012-09-27 DIAGNOSIS — H9210 Otorrhea, unspecified ear: Secondary | ICD-10-CM | POA: Diagnosis not present

## 2012-09-27 DIAGNOSIS — J209 Acute bronchitis, unspecified: Secondary | ICD-10-CM | POA: Diagnosis not present

## 2012-09-27 DIAGNOSIS — R51 Headache: Secondary | ICD-10-CM | POA: Diagnosis not present

## 2012-09-27 DIAGNOSIS — Z880 Allergy status to penicillin: Secondary | ICD-10-CM | POA: Insufficient documentation

## 2012-09-27 DIAGNOSIS — J0100 Acute maxillary sinusitis, unspecified: Secondary | ICD-10-CM | POA: Insufficient documentation

## 2012-09-27 DIAGNOSIS — R05 Cough: Secondary | ICD-10-CM | POA: Insufficient documentation

## 2012-09-27 DIAGNOSIS — Z8679 Personal history of other diseases of the circulatory system: Secondary | ICD-10-CM | POA: Insufficient documentation

## 2012-09-27 DIAGNOSIS — I10 Essential (primary) hypertension: Secondary | ICD-10-CM | POA: Diagnosis not present

## 2012-09-27 DIAGNOSIS — R000 Tachycardia, unspecified: Secondary | ICD-10-CM | POA: Insufficient documentation

## 2012-09-27 DIAGNOSIS — J40 Bronchitis, not specified as acute or chronic: Secondary | ICD-10-CM

## 2012-09-27 DIAGNOSIS — H6690 Otitis media, unspecified, unspecified ear: Secondary | ICD-10-CM | POA: Diagnosis not present

## 2012-09-27 MED ORDER — AZITHROMYCIN 250 MG PO TABS
500.0000 mg | ORAL_TABLET | Freq: Once | ORAL | Status: AC
  Administered 2012-09-27: 500 mg via ORAL
  Filled 2012-09-27: qty 2

## 2012-09-27 MED ORDER — IPRATROPIUM BROMIDE 0.02 % IN SOLN
0.5000 mg | Freq: Once | RESPIRATORY_TRACT | Status: AC
  Administered 2012-09-27: 0.5 mg via RESPIRATORY_TRACT
  Filled 2012-09-27: qty 2.5

## 2012-09-27 MED ORDER — AZITHROMYCIN 250 MG PO TABS: 250.0000 mg | ORAL_TABLET | Freq: Every day | ORAL | Status: DC

## 2012-09-27 MED ORDER — ALBUTEROL SULFATE (2.5 MG/3ML) 0.083% IN NEBU: 2.5000 mg | INHALATION_SOLUTION | Freq: Four times a day (QID) | RESPIRATORY_TRACT | Status: DC | PRN

## 2012-09-27 MED ORDER — NEOMYCIN-POLYMYXIN-HC 3.5-10000-1 OT SOLN
4.0000 [drp] | Freq: Four times a day (QID) | OTIC | Status: DC
  Administered 2012-09-27: 4 [drp] via OTIC
  Filled 2012-09-27: qty 10

## 2012-09-27 MED ORDER — SULFAMETHOXAZOLE-TMP DS 800-160 MG PO TABS: 1.0000 | ORAL_TABLET | Freq: Once | ORAL | Status: DC

## 2012-09-27 MED ORDER — ALBUTEROL SULFATE (5 MG/ML) 0.5% IN NEBU
2.5000 mg | INHALATION_SOLUTION | Freq: Once | RESPIRATORY_TRACT | Status: AC
  Administered 2012-09-27: 2.5 mg via RESPIRATORY_TRACT
  Filled 2012-09-27: qty 0.5

## 2012-09-27 NOTE — ED Notes (Addendum)
Pt c/o nasal congestion, cough, facial pain and tingling, and ear ache.

## 2012-09-27 NOTE — ED Provider Notes (Signed)
History     CSN: [id number]  Arrival date & time 09/27/12  2123   First MD Initiated Contact with Patient 09/27/12 2213      Chief Complaint  Patient presents with  . Nasal Congestion  . Otalgia  . Cough  . Facial Pain    (Consider location/radiation/quality/duration/timing/severity/associated sxs/prior treatment) Patient is a 32 y.o. male presenting with ear pain and cough. The history is provided by the patient.  Otalgia Location:  Left Quality:  Aching Severity:  Moderate Onset quality:  Gradual Duration:  2 days Timing:  Constant Associated symptoms: congestion, cough, ear discharge, headaches and rhinorrhea   Associated symptoms: no abdominal pain, no fever, no hearing loss, no neck pain, no rash, no sore throat and no vomiting   Cough Associated symptoms: ear pain, headaches and rhinorrhea   Associated symptoms: no chest pain, no fever, no rash and no sore throat    Chad Hampton is a 32 y.o. male who presents to the ED with cough, cold congestion, ear ache and facial pain. He was evaluated here 09/20/12 for congestion and started on albuterol and steroids. Took the medication as directed but has continued to have congestion and now ear ache and drainage. Thought he had a tooth ache yesterday on the left side but not today. The cough is productive with dark green sputum, but has improved since last visit.  Patient smokes half pack per day.    Past Medical History  Diagnosis Date  . Seizure disorder   . Asthma   . Hypertension   . Acute angina   . CAD (coronary artery disease)   . Seizures     Past Surgical History  Procedure Laterality Date  . Cholecystectomy    . Eye surgery    . Facial reconstruction surgery      Family History  Problem Relation Age of Onset  . Diabetes Mother   . Hypertension Mother   . Heart failure Father   . Stroke Father   . Diabetes Father   . Heart failure Brother     History  Substance Use Topics  . Smoking status: Current  Every Day Smoker -- 0.50 packs/day for 20 years    Types: Cigarettes  . Smokeless tobacco: Never Used  . Alcohol Use: Yes     Comment: occasionally      Review of Systems  Constitutional: Negative for fever.  HENT: Positive for ear pain, congestion, rhinorrhea and ear discharge. Negative for hearing loss, sore throat and neck pain.   Eyes: Negative for visual disturbance.  Respiratory: Positive for cough.   Cardiovascular: Negative for chest pain.  Gastrointestinal: Negative for nausea, vomiting and abdominal pain.  Genitourinary: Negative for dysuria, urgency and frequency.  Skin: Negative for rash.  Neurological: Positive for headaches. Negative for syncope.  Psychiatric/Behavioral: Negative for confusion. The patient is not nervous/anxious.     Allergies  Morphine and related; Penicillins; Tramadol; and Ibuprofen  Home Medications   Current Outpatient Rx  Name  Route  Sig  Dispense  Refill  . albuterol (PROVENTIL) (2.5 MG/3ML) 0.083% nebulizer solution   Nebulization   Take 2.5 mg by nebulization every 6 (six) hours as needed for wheezing or shortness of breath.         Marland Kitchen aspirin 81 MG tablet   Oral   Take 81 mg by mouth daily.         . benazepril-hydrochlorthiazide (LOTENSIN HCT) 20-25 MG per tablet   Oral   Take 1  tablet by mouth daily.         . cyclobenzaprine (FLEXERIL) 10 MG tablet   Oral   Take 10 mg by mouth daily as needed for muscle spasms.          . divalproex (DEPAKOTE ER) 500 MG 24 hr tablet   Oral   Take 1 tablet (500 mg total) by mouth at bedtime.   30 tablet   1   . nitroGLYCERIN (NITROSTAT) 0.4 MG SL tablet   Sublingual   Place 0.4 mg under the tongue every 5 (five) minutes as needed.         . phenytoin (DILANTIN) 100 MG ER capsule   Oral   Take 300 mg by mouth every morning.           . predniSONE (DELTASONE) 50 MG tablet      One tablet PO daily for 4 days   4 tablet   0     BP 146/83  Pulse 102  Temp(Src) 98.2  F (36.8 C)  Resp 20  Ht 5' 10.5" (1.791 m)  Wt 248 lb (112.492 kg)  BMI 35.07 kg/m2  SpO2 95%  Physical Exam  Nursing note and vitals reviewed. Constitutional: He is oriented to person, place, and time. He appears well-developed and well-nourished. No distress.  HENT:  Head: Normocephalic and atraumatic.  Nose: Left sinus exhibits maxillary sinus tenderness.  Mouth/Throat: Uvula is midline, oropharynx is clear and moist and mucous membranes are normal.  Bilateral TM's occluded with cerumen. Large tonsils but without exudate or erythema  Eyes: EOM are normal.  Neck: Neck supple.  Cardiovascular: Tachycardia present.   Pulmonary/Chest: Effort normal. He has wheezes.  Abdominal: Soft. Bowel sounds are normal. There is no tenderness.  Musculoskeletal: Normal range of motion.  Neurological: He is alert and oriented to person, place, and time. No cranial nerve deficit.  Skin: Skin is warm and dry.  Psychiatric: He has a normal mood and affect. His behavior is normal. Judgment and thought content normal.    ED Course  Procedures (including critical care time) After ears irrigated with warm water TM's visualized and have erythema, no light reflex present, retracted.   MDM  32 y.o. male with bilateral otitis media and sinusitis. Patient is allergic to penicillin. Discussed with Dr. Colon Branch and will start patient on Zithromax. He is to follow up with his PCP. Patient feeling better after neb treatment and after ear irrigation and cortisporin otic drops.  I have reviewed this patient's vital signs, nurses notes and discussed with the patient clinical findings and plan of care and he voices understanding.    Medication List    TAKE these medications       albuterol (2.5 MG/3ML) 0.083% nebulizer solution  Commonly known as:  PROVENTIL  Take 3 mLs (2.5 mg total) by nebulization every 6 (six) hours as needed for wheezing.     azithromycin 250 MG tablet  Commonly known as:  ZITHROMAX   Take 1 tablet (250 mg total) by mouth daily. Take first 2 tablets together, then 1 every day until finished.      ASK your doctor about these medications       albuterol (2.5 MG/3ML) 0.083% nebulizer solution  Commonly known as:  PROVENTIL  Take 2.5 mg by nebulization every 6 (six) hours as needed for wheezing or shortness of breath.     aspirin 81 MG tablet  Take 81 mg by mouth daily.     benazepril-hydrochlorthiazide 20-25 MG  per tablet  Commonly known as:  LOTENSIN HCT  Take 1 tablet by mouth daily.     cyclobenzaprine 10 MG tablet  Commonly known as:  FLEXERIL  Take 10 mg by mouth daily as needed for muscle spasms.     divalproex 500 MG 24 hr tablet  Commonly known as:  DEPAKOTE ER  Take 1 tablet (500 mg total) by mouth at bedtime.     nitroGLYCERIN 0.4 MG SL tablet  Commonly known as:  NITROSTAT  Place 0.4 mg under the tongue every 5 (five) minutes as needed.     phenytoin 100 MG ER capsule  Commonly known as:  DILANTIN  Take 300 mg by mouth every morning.               Janne Napoleon, [place] 09/27/12 2314

## 2012-09-27 NOTE — ED Provider Notes (Signed)
Medical screening examination/treatment/procedure(s) were performed by non-physician practitioner and as supervising physician I was immediately available for consultation/collaboration. Devoria Albe, MD, [facility]   Ward Givens, MD 09/27/12 2325

## 2012-09-29 DIAGNOSIS — H6520 Chronic serous otitis media, unspecified ear: Secondary | ICD-10-CM | POA: Diagnosis not present

## 2012-09-29 DIAGNOSIS — H903 Sensorineural hearing loss, bilateral: Secondary | ICD-10-CM | POA: Diagnosis not present

## 2012-09-29 DIAGNOSIS — J0100 Acute maxillary sinusitis, unspecified: Secondary | ICD-10-CM | POA: Diagnosis not present

## 2012-09-29 DIAGNOSIS — H6980 Other specified disorders of Eustachian tube, unspecified ear: Secondary | ICD-10-CM | POA: Diagnosis not present

## 2012-09-29 DIAGNOSIS — J343 Hypertrophy of nasal turbinates: Secondary | ICD-10-CM | POA: Diagnosis not present

## 2012-10-19 ENCOUNTER — Emergency Department (HOSPITAL_COMMUNITY)
Admission: EM | Admit: 2012-10-19 | Discharge: 2012-10-19 | Disposition: A | Discharge status: Home / Self Care | Payer: Medicare Other | Attending: Emergency Medicine | Admitting: Emergency Medicine

## 2012-10-19 ENCOUNTER — Encounter (HOSPITAL_COMMUNITY): Payer: Self-pay | Admitting: *Deleted

## 2012-10-19 DIAGNOSIS — S0180XA Unspecified open wound of other part of head, initial encounter: Secondary | ICD-10-CM | POA: Insufficient documentation

## 2012-10-19 DIAGNOSIS — W1809XA Striking against other object with subsequent fall, initial encounter: Secondary | ICD-10-CM | POA: Insufficient documentation

## 2012-10-19 DIAGNOSIS — Z8679 Personal history of other diseases of the circulatory system: Secondary | ICD-10-CM | POA: Insufficient documentation

## 2012-10-19 DIAGNOSIS — R110 Nausea: Secondary | ICD-10-CM | POA: Diagnosis not present

## 2012-10-19 DIAGNOSIS — G40909 Epilepsy, unspecified, not intractable, without status epilepticus: Secondary | ICD-10-CM | POA: Insufficient documentation

## 2012-10-19 DIAGNOSIS — Z7982 Long term (current) use of aspirin: Secondary | ICD-10-CM | POA: Insufficient documentation

## 2012-10-19 DIAGNOSIS — S0990XA Unspecified injury of head, initial encounter: Secondary | ICD-10-CM | POA: Diagnosis not present

## 2012-10-19 DIAGNOSIS — J45909 Unspecified asthma, uncomplicated: Secondary | ICD-10-CM | POA: Diagnosis not present

## 2012-10-19 DIAGNOSIS — I10 Essential (primary) hypertension: Secondary | ICD-10-CM | POA: Insufficient documentation

## 2012-10-19 DIAGNOSIS — I2510 Atherosclerotic heart disease of native coronary artery without angina pectoris: Secondary | ICD-10-CM | POA: Diagnosis not present

## 2012-10-19 DIAGNOSIS — Y92009 Unspecified place in unspecified non-institutional (private) residence as the place of occurrence of the external cause: Secondary | ICD-10-CM | POA: Insufficient documentation

## 2012-10-19 DIAGNOSIS — S0181XA Laceration without foreign body of other part of head, initial encounter: Secondary | ICD-10-CM

## 2012-10-19 DIAGNOSIS — W010XXA Fall on same level from slipping, tripping and stumbling without subsequent striking against object, initial encounter: Secondary | ICD-10-CM | POA: Insufficient documentation

## 2012-10-19 DIAGNOSIS — Z79899 Other long term (current) drug therapy: Secondary | ICD-10-CM | POA: Diagnosis not present

## 2012-10-19 DIAGNOSIS — Z880 Allergy status to penicillin: Secondary | ICD-10-CM | POA: Diagnosis not present

## 2012-10-19 DIAGNOSIS — Y9301 Activity, walking, marching and hiking: Secondary | ICD-10-CM | POA: Insufficient documentation

## 2012-10-19 MED ORDER — LIDOCAINE-EPINEPHRINE-TETRACAINE (LET) SOLUTION
3.0000 mL | Freq: Once | NASAL | Status: AC
  Administered 2012-10-19: 3 mL via TOPICAL
  Filled 2012-10-19: qty 3

## 2012-10-19 NOTE — ED Provider Notes (Signed)
History    CSN: [id number] Arrival date & time 10/19/12  1411  First MD Initiated Contact with Patient 10/19/12 1543     Chief Complaint  Patient presents with  . Head Laceration   (Consider location/radiation/quality/duration/timing/severity/associated sxs/prior Treatment) Patient is a 32 y.o. male presenting with scalp laceration. The history is provided by the patient.  Head Laceration This is a new problem. The current episode started today. The problem has been unchanged. Associated symptoms include headaches and nausea. Pertinent negatives include no abdominal pain, chills, fever, neck pain or vomiting. He has tried nothing for the symptoms.   Chad Hampton is a 32 y.o. male who presents to the ED with a laceration to the right forehead. He was walking and tripped and fell hitting his head on the floor molding. He was in his home. No LOC, but did have some visual problems in the right eye immediately after the injury but normal vision now. Continues to have a headache in the area of the injury. He is up to date on tetanus.  Past Medical History  Diagnosis Date  . Seizure disorder   . Asthma   . Hypertension   . Acute angina   . CAD (coronary artery disease)   . Seizures    Past Surgical History  Procedure Laterality Date  . Cholecystectomy    . Eye surgery    . Facial reconstruction surgery     Family History  Problem Relation Age of Onset  . Diabetes Mother   . Hypertension Mother   . Heart failure Father   . Stroke Father   . Diabetes Father   . Heart failure Brother    History  Substance Use Topics  . Smoking status: Current Every Day Smoker -- 0.50 packs/day for 20 years    Types: Cigarettes  . Smokeless tobacco: Never Used  . Alcohol Use: Yes     Comment: occasionally    Review of Systems  Constitutional: Negative for fever and chills.  HENT: Negative for neck pain.   Eyes: Visual disturbance: initally, none now.  Respiratory: Negative for shortness  of breath.   Gastrointestinal: Positive for nausea. Negative for vomiting and abdominal pain.  Skin: Positive for wound.  Allergic/Immunologic: Negative for immunocompromised state.  Neurological: Positive for headaches.  Psychiatric/Behavioral: The patient is not nervous/anxious.     Allergies  Morphine and related; Penicillins; Tramadol; and Ibuprofen  Home Medications   Current Outpatient Rx  Name  Route  Sig  Dispense  Refill  . albuterol (PROVENTIL) (2.5 MG/3ML) 0.083% nebulizer solution   Nebulization   Take 3 mLs (2.5 mg total) by nebulization every 6 (six) hours as needed for wheezing.   75 mL   1   . aspirin 81 MG tablet   Oral   Take 81 mg by mouth daily.         . benazepril-hydrochlorthiazide (LOTENSIN HCT) 20-25 MG per tablet   Oral   Take 1 tablet by mouth daily.         . divalproex (DEPAKOTE ER) 500 MG 24 hr tablet   Oral   Take 1 tablet (500 mg total) by mouth at bedtime.   30 tablet   1   . phenytoin (DILANTIN) 100 MG ER capsule   Oral   Take 300 mg by mouth every morning.           . nitroGLYCERIN (NITROSTAT) 0.4 MG SL tablet   Sublingual   Place 0.4 mg under the  tongue every 5 (five) minutes as needed.          BP 124/70  Pulse 81  Temp(Src) 97.3 F (36.3 C) (Oral)  Resp 17  Ht 5\' 10"  (1.778 m)  Wt 268 lb (121.564 kg)  BMI 38.45 kg/m2  SpO2 97% Physical Exam  Constitutional: He is oriented to person, place, and time. No distress.  HENT:  Right Ear: Tympanic membrane normal.  Left Ear: Tympanic membrane normal.  Nose: Nose normal. No epistaxis.  Mouth/Throat: Uvula is midline, oropharynx is clear and moist and mucous membranes are normal.  Superficial laceration to the right forehead, bleeding controlled.  Eyes: Conjunctivae and EOM are normal. Pupils are equal, round, and reactive to light.  Neck: Normal range of motion. Neck supple.  Cardiovascular: Normal rate.   Pulmonary/Chest: Effort normal.  Abdominal: Soft. There is  no tenderness.  Musculoskeletal: Normal range of motion.  Neurological: He is alert and oriented to person, place, and time. He has normal strength. No cranial nerve deficit or sensory deficit. Coordination and gait normal.  Skin:  Laceration forehead  Psychiatric: He has a normal mood and affect. His behavior is normal.    ED Course  Procedures (including critical care time) LACERATION REPAIR Performed by: Rashan Patient Authorized by: Nabiha Planck Consent: Verbal consent obtained. Risks and benefits: risks, benefits and alternatives were discussed Consent given by: patient Patient identity confirmed: provided demographic data Prepped and Draped in normal sterile fashion Wound explored  Laceration Location: right forehead  Laceration Length: 1.5 cm  No Foreign Bodies seen or palpated  Anesthesia: local infiltration  LET applied  Amount of cleaning: standard  Skin closure: liquid closure  Technique: Derma Bond  Patient tolerance: Patient tolerated the procedure well with no immediate complications.   MDM  32 y.o. male with superficial laceration to right forehead s/p fall. He is alert and oriented, vision 20/20 each eye. He has no signs of brain injury at this time. Discussed head injuries with the patient and instructions given to family member. Patient stable for discharge without any immediate complications. No further screening or x-rays indicated at this time.  Discussed with the patient and all questioned fully answered. He will return if any problems arise.   [place] Moranda Billiot, [place] 10/19/12 [phone]

## 2012-10-19 NOTE — ED Notes (Signed)
Awaiting pt's transportation home, pa requested another adult be given d/c instructions, pt aware. awating brother to arrive.

## 2012-10-19 NOTE — ED Provider Notes (Signed)
Medical screening examination/treatment/procedure(s) were performed by non-physician practitioner and as supervising physician I was immediately available for consultation/collaboration.   Gilda Crease, MD 10/19/12 [phone]

## 2012-10-19 NOTE — ED Notes (Signed)
Forehead laceration .  Tripped on struck his forehead on moulding, No neck pain.  Headache.  No LOC.  Nausea.

## 2012-10-19 NOTE — ED Notes (Signed)
Pt's brother Chad Hampton arrived, he was given d/c instructions and verbalized understanding of all, no scripts given. Pt walked out un-aided.

## 2012-10-20 DIAGNOSIS — J310 Chronic rhinitis: Secondary | ICD-10-CM | POA: Diagnosis not present

## 2012-10-20 DIAGNOSIS — H6980 Other specified disorders of Eustachian tube, unspecified ear: Secondary | ICD-10-CM | POA: Diagnosis not present

## 2012-10-20 DIAGNOSIS — H6120 Impacted cerumen, unspecified ear: Secondary | ICD-10-CM | POA: Diagnosis not present

## 2012-10-20 DIAGNOSIS — J343 Hypertrophy of nasal turbinates: Secondary | ICD-10-CM | POA: Diagnosis not present

## 2012-10-20 DIAGNOSIS — H902 Conductive hearing loss, unspecified: Secondary | ICD-10-CM | POA: Diagnosis not present

## 2012-10-20 DIAGNOSIS — H6520 Chronic serous otitis media, unspecified ear: Secondary | ICD-10-CM | POA: Diagnosis not present

## 2012-12-24 DIAGNOSIS — I10 Essential (primary) hypertension: Secondary | ICD-10-CM | POA: Diagnosis not present

## 2013-01-06 DIAGNOSIS — E119 Type 2 diabetes mellitus without complications: Secondary | ICD-10-CM | POA: Diagnosis not present

## 2013-01-21 ENCOUNTER — Emergency Department (HOSPITAL_COMMUNITY)
Admission: EM | Admit: 2013-01-21 | Discharge: 2013-01-21 | Disposition: A | Discharge status: Home / Self Care | Payer: Medicare Other | Attending: Emergency Medicine | Admitting: Emergency Medicine

## 2013-01-21 ENCOUNTER — Encounter (HOSPITAL_COMMUNITY): Payer: Self-pay

## 2013-01-21 DIAGNOSIS — J45909 Unspecified asthma, uncomplicated: Secondary | ICD-10-CM | POA: Insufficient documentation

## 2013-01-21 DIAGNOSIS — G40909 Epilepsy, unspecified, not intractable, without status epilepticus: Secondary | ICD-10-CM | POA: Insufficient documentation

## 2013-01-21 DIAGNOSIS — H6690 Otitis media, unspecified, unspecified ear: Secondary | ICD-10-CM | POA: Diagnosis not present

## 2013-01-21 DIAGNOSIS — K089 Disorder of teeth and supporting structures, unspecified: Secondary | ICD-10-CM | POA: Diagnosis not present

## 2013-01-21 DIAGNOSIS — I2510 Atherosclerotic heart disease of native coronary artery without angina pectoris: Secondary | ICD-10-CM | POA: Diagnosis not present

## 2013-01-21 DIAGNOSIS — Z79899 Other long term (current) drug therapy: Secondary | ICD-10-CM | POA: Insufficient documentation

## 2013-01-21 DIAGNOSIS — H6123 Impacted cerumen, bilateral: Secondary | ICD-10-CM

## 2013-01-21 DIAGNOSIS — Z880 Allergy status to penicillin: Secondary | ICD-10-CM | POA: Insufficient documentation

## 2013-01-21 DIAGNOSIS — F17200 Nicotine dependence, unspecified, uncomplicated: Secondary | ICD-10-CM | POA: Insufficient documentation

## 2013-01-21 DIAGNOSIS — K0889 Other specified disorders of teeth and supporting structures: Secondary | ICD-10-CM

## 2013-01-21 DIAGNOSIS — Z7982 Long term (current) use of aspirin: Secondary | ICD-10-CM | POA: Insufficient documentation

## 2013-01-21 DIAGNOSIS — H6120 Impacted cerumen, unspecified ear: Secondary | ICD-10-CM | POA: Insufficient documentation

## 2013-01-21 DIAGNOSIS — H65199 Other acute nonsuppurative otitis media, unspecified ear: Secondary | ICD-10-CM | POA: Diagnosis not present

## 2013-01-21 DIAGNOSIS — I10 Essential (primary) hypertension: Secondary | ICD-10-CM | POA: Diagnosis not present

## 2013-01-21 DIAGNOSIS — H6692 Otitis media, unspecified, left ear: Secondary | ICD-10-CM

## 2013-01-21 MED ORDER — NEOMYCIN-POLYMYXIN-HC 1 % OT SOLN
4.0000 [drp] | Freq: Once | OTIC | Status: AC
  Administered 2013-01-21: 4 [drp] via OTIC

## 2013-01-21 MED ORDER — NEOMYCIN-POLYMYXIN-HC 3.5-10000-1 OT SUSP
OTIC | Status: AC
  Administered 2013-01-21: 17:00:00
  Filled 2013-01-21: qty 10

## 2013-01-21 MED ORDER — HYDROCODONE-ACETAMINOPHEN 5-325 MG PO TABS: 1.0000 | ORAL_TABLET | ORAL | Status: DC | PRN

## 2013-01-21 MED ORDER — AZITHROMYCIN 250 MG PO TABS: ORAL_TABLET | ORAL | Status: DC

## 2013-01-21 NOTE — ED Notes (Signed)
Ear wax removal done by Mayer Camel NP.

## 2013-01-21 NOTE — ED Notes (Signed)
Pain lt ear and lt mandibular molar for 2 days.

## 2013-01-21 NOTE — ED Provider Notes (Signed)
CSN: [id number]     Arrival date & time 01/21/13  1608 History   First MD Initiated Contact with Patient 01/21/13 1612     Chief Complaint  Patient presents with  . Otalgia  . Dental Pain   (Consider location/radiation/quality/duration/timing/severity/associated sxs/prior Treatment) Patient is a 32 y.o. male presenting with ear pain and tooth pain. The history is provided by the patient.  Otalgia Location:  Left Severity:  Moderate (6/10) Onset quality:  Gradual Duration:  2 days Timing:  Constant Progression:  Worsening Chronicity:  New Relieved by:  Nothing Worsened by:  Nothing tried Ineffective treatments:  None tried Associated symptoms: no abdominal pain, no congestion, no cough, no diarrhea, no ear discharge, no fever, no headaches, no rash, no rhinorrhea, no sore throat and no vomiting  Hearing loss: some in left ear.   Risk factors: no recent travel, no chronic ear infection and no prior ear surgery   Dental Pain Associated symptoms: no congestion, no fever and no headaches    Chad Hampton is a 32 y.o. male who presents to the ED with pain in his left ear that started 2 days ago. He also complains of pain in the left low dental area that started 1 day ago.   Past Medical History  Diagnosis Date  . Seizure disorder   . Asthma   . Hypertension   . Acute angina   . CAD (coronary artery disease)   . Seizures    Past Surgical History  Procedure Laterality Date  . Cholecystectomy    . Eye surgery    . Facial reconstruction surgery     Family History  Problem Relation Age of Onset  . Diabetes Mother   . Hypertension Mother   . Heart failure Father   . Stroke Father   . Diabetes Father   . Heart failure Brother    History  Substance Use Topics  . Smoking status: Current Every Day Smoker -- 0.50 packs/day for 20 years    Types: Cigarettes  . Smokeless tobacco: Never Used  . Alcohol Use: Yes     Comment: occasionally    Review of Systems  Constitutional:  Negative for fever.  HENT: Positive for ear pain and dental problem. Negative for congestion, sore throat, rhinorrhea, trouble swallowing and ear discharge. Hearing loss: some in left ear.   Respiratory: Negative for cough.   Gastrointestinal: Negative for nausea, vomiting, abdominal pain and diarrhea.  Skin: Negative for rash.  Allergic/Immunologic: Negative for immunocompromised state.  Neurological: Negative for headaches.  Psychiatric/Behavioral: The patient is not nervous/anxious.     Allergies  Morphine and related; Penicillins; Tramadol; and Ibuprofen  Home Medications   Current Outpatient Rx  Name  Route  Sig  Dispense  Refill  . albuterol (PROVENTIL) (2.5 MG/3ML) 0.083% nebulizer solution   Nebulization   Take 3 mLs (2.5 mg total) by nebulization every 6 (six) hours as needed for wheezing.   75 mL   1   . aspirin 81 MG tablet   Oral   Take 81 mg by mouth daily.         . benazepril-hydrochlorthiazide (LOTENSIN HCT) 20-25 MG per tablet   Oral   Take 1 tablet by mouth daily.         . divalproex (DEPAKOTE ER) 500 MG 24 hr tablet   Oral   Take 1 tablet (500 mg total) by mouth at bedtime.   30 tablet   1   . phenytoin (DILANTIN) 100 MG  ER capsule   Oral   Take 300 mg by mouth every morning.           . nitroGLYCERIN (NITROSTAT) 0.4 MG SL tablet   Sublingual   Place 0.4 mg under the tongue every 5 (five) minutes as needed.          BP 143/85  Pulse 96  Temp(Src) 98.1 F (36.7 C) (Oral)  Resp 18  Ht 5' 10.5" (1.791 m)  Wt 280 lb (127.007 kg)  BMI 39.59 kg/m2  SpO2 99% Physical Exam  Nursing note and vitals reviewed. Constitutional: He is oriented to person, place, and time. He appears well-developed and well-nourished. No distress.  HENT:  Head: Normocephalic and atraumatic.  Mouth/Throat: Uvula is midline, oropharynx is clear and moist and mucous membranes are normal.    Cerumen impaction bilateral Tenderness left lower third molar.  Eyes:  Conjunctivae and EOM are normal.  Neck: Normal range of motion. Neck supple.  Cardiovascular: Normal rate, regular rhythm and normal heart sounds.   Pulmonary/Chest: Effort normal. He has wheezes.  Abdominal: Soft. Bowel sounds are normal. There is no tenderness.  Musculoskeletal: Normal range of motion.  Lymphadenopathy:    Cervical adenopathy: left.  Neurological: He is alert and oriented to person, place, and time. No cranial nerve deficit.  Skin: Skin is warm and dry.  Psychiatric: He has a normal mood and affect. His behavior is normal.    ED Course: I irrigated both ears and large amount of cerumen removed.  Re examined, ear canals with erythema bilateral. Right TM normal, left TM with erythema and abnormal light reflex.   Procedures   MDM  32 y.o. male with cerumen impaction bilateral, removed with ear irrigation. Left Otitis Media, dental pain left lower wisdom tooth. Will treat with antibiotics and and pain management. Patient to follow up with his PCP next week. He will return here for worsening symptoms. Cortisporin Otic drops instilled in ears prior to discharge.  Discussed with the patient and all questioned fully answered.   Medication List    TAKE these medications       azithromycin 250 MG tablet  Commonly known as:  ZITHROMAX Z-PAK  Take two tablets PO today and then one tablet daily until finished     HYDROcodone-acetaminophen 5-325 MG per tablet  Commonly known as:  NORCO/VICODIN  Take 1 tablet by mouth every 4 (four) hours as needed.      ASK your doctor about these medications       albuterol (2.5 MG/3ML) 0.083% nebulizer solution  Commonly known as:  PROVENTIL  Take 3 mLs (2.5 mg total) by nebulization every 6 (six) hours as needed for wheezing.     aspirin 81 MG tablet  Take 81 mg by mouth daily.     benazepril-hydrochlorthiazide 20-25 MG per tablet  Commonly known as:  LOTENSIN HCT  Take 1 tablet by mouth daily.     divalproex 500 MG 24 hr tablet   Commonly known as:  DEPAKOTE ER  Take 1 tablet (500 mg total) by mouth at bedtime.     nitroGLYCERIN 0.4 MG SL tablet  Commonly known as:  NITROSTAT  Place 0.4 mg under the tongue every 5 (five) minutes as needed.     phenytoin 100 MG ER capsule  Commonly known as:  DILANTIN  Take 300 mg by mouth every morning.           El Brazil, NP 01/21/13 [address] [place], NP 01/21/13  1714 

## 2013-01-21 NOTE — ED Notes (Signed)
Pt c/o left earache and toothache x 2 days.

## 2013-01-21 NOTE — ED Provider Notes (Signed)
Medical screening examination/treatment/procedure(s) were performed by non-physician practitioner and as supervising physician I was immediately available for consultation/collaboration.    Vida Roller, MD 01/21/13 [phone]

## 2013-01-23 ENCOUNTER — Emergency Department (HOSPITAL_COMMUNITY)
Admission: EM | Admit: 2013-01-23 | Discharge: 2013-01-24 | Disposition: A | Discharge status: Home / Self Care | Payer: Medicare Other | Attending: Emergency Medicine | Admitting: Emergency Medicine

## 2013-01-23 ENCOUNTER — Encounter (HOSPITAL_COMMUNITY): Payer: Self-pay | Admitting: Emergency Medicine

## 2013-01-23 ENCOUNTER — Emergency Department (HOSPITAL_COMMUNITY): Payer: Medicare Other

## 2013-01-23 DIAGNOSIS — I10 Essential (primary) hypertension: Secondary | ICD-10-CM | POA: Diagnosis not present

## 2013-01-23 DIAGNOSIS — Z79899 Other long term (current) drug therapy: Secondary | ICD-10-CM | POA: Diagnosis not present

## 2013-01-23 DIAGNOSIS — G40909 Epilepsy, unspecified, not intractable, without status epilepticus: Secondary | ICD-10-CM | POA: Insufficient documentation

## 2013-01-23 DIAGNOSIS — Z792 Long term (current) use of antibiotics: Secondary | ICD-10-CM | POA: Insufficient documentation

## 2013-01-23 DIAGNOSIS — I2510 Atherosclerotic heart disease of native coronary artery without angina pectoris: Secondary | ICD-10-CM | POA: Insufficient documentation

## 2013-01-23 DIAGNOSIS — Z880 Allergy status to penicillin: Secondary | ICD-10-CM | POA: Insufficient documentation

## 2013-01-23 DIAGNOSIS — F17200 Nicotine dependence, unspecified, uncomplicated: Secondary | ICD-10-CM | POA: Diagnosis not present

## 2013-01-23 DIAGNOSIS — J45909 Unspecified asthma, uncomplicated: Secondary | ICD-10-CM | POA: Diagnosis not present

## 2013-01-23 DIAGNOSIS — R569 Unspecified convulsions: Secondary | ICD-10-CM | POA: Diagnosis not present

## 2013-01-23 LAB — CBC WITH DIFFERENTIAL/PLATELET
Basophils Absolute: 0 10*3/uL (ref 0.0–0.1)
Basophils Relative: 0 % (ref 0–1)
Eosinophils Relative: 3 % (ref 0–5)
HCT: 45.1 % (ref 39.0–52.0)
Hemoglobin: 15.6 g/dL (ref 13.0–17.0)
Lymphocytes Relative: 39 % (ref 12–46)
MCH: 30.6 pg (ref 26.0–34.0)
MCHC: 34.6 g/dL (ref 30.0–36.0)
MCV: 88.6 fL (ref 78.0–100.0)
Monocytes Absolute: 0.6 10*3/uL (ref 0.1–1.0)
Monocytes Relative: 5 % (ref 3–12)
RDW: 12.7 % (ref 11.5–15.5)

## 2013-01-23 LAB — COMPREHENSIVE METABOLIC PANEL
AST: 31 U/L (ref 0–37)
Albumin: 3.8 g/dL (ref 3.5–5.2)
Alkaline Phosphatase: 99 U/L (ref 39–117)
BUN: 9 mg/dL (ref 6–23)
CO2: 27 mEq/L (ref 19–32)
Calcium: 9.7 mg/dL (ref 8.4–10.5)
Chloride: 97 mEq/L (ref 96–112)
Creatinine, Ser: 0.89 mg/dL (ref 0.50–1.35)
GFR calc Af Amer: >90 mL/min (ref >90)
GFR calc non Af Amer: >90 mL/min (ref >90)
Glucose, Bld: 96 mg/dL (ref 70–99)
Total Bilirubin: 0.4 mg/dL (ref 0.3–1.2)

## 2013-01-23 LAB — TROPONIN I: Troponin I: <0.3 ng/mL (ref <0.30)

## 2013-01-23 LAB — VALPROIC ACID LEVEL: Valproic Acid Lvl: 0 ug/mL — ABNORMAL LOW (ref 50.0–100.0)

## 2013-01-23 LAB — PHENYTOIN LEVEL, TOTAL: Phenytoin Lvl: 1.4 ug/mL — ABNORMAL LOW (ref 10.0–20.0)

## 2013-01-23 MED ORDER — SODIUM CHLORIDE 0.9 % IV SOLN
15.0000 mg/kg | Freq: Once | INTRAVENOUS | Status: AC
  Administered 2013-01-24: 1905 mg via INTRAVENOUS
  Filled 2013-01-23: qty 38.1

## 2013-01-23 NOTE — ED Notes (Signed)
Notified AC of need for ordered IV med

## 2013-01-23 NOTE — ED Notes (Signed)
Friend described tonic/clonic seizure activity to EMS.  Stopped spontaneously - short post-ictal period afterwards.  (friend states she talked him out of his seizure and also placed wet cool cloth on forehead.  Upper clothing wet on arrival

## 2013-01-23 NOTE — ED Notes (Signed)
Patient is aware of need for urine specimen. 

## 2013-01-23 NOTE — ED Provider Notes (Signed)
Scribed for Chad Octave, MD, the patient was seen in room APA05/APA05. This chart was scribed by Lewanda Rife, ED scribe. Patient's care was started at 2114  CSN: [id number]     Arrival date & time 01/23/13  2106 History   First MD Initiated Contact with Patient 01/23/13 2108     Chief Complaint  Patient presents with  . Seizures   (Consider location/radiation/quality/duration/timing/severity/associated sxs/prior Treatment) The history is provided by the patient and the EMS personnel.   HPI Comments: Chad Hampton is a 32 y.o. male brought in by ambulance, who presents to the Emergency Department with a PMHx of seizures (since 2000) complaining of witnessed seizures onset PTA. Reports non-compliance with prescribed dilantin and Depakote. EMS reports 2 seizures per family. Reports associated constant mild headache. Denies any aggravating or alleviating factors for headaches. Denies associated recent fall, recent head injury, urinary and bowel incontinence, chest pain, shortness of breath, dizziness, visual disturbances, neck pain, and back pain. Does not know when he had last seizure. Hx of head trauma as a kid. Denies illicit drug use and alcohol abuse.  Past Medical History  Diagnosis Date  . Seizure disorder   . Asthma   . Hypertension   . Acute angina   . CAD (coronary artery disease)   . Seizures    Past Surgical History  Procedure Laterality Date  . Cholecystectomy    . Eye surgery    . Facial reconstruction surgery     Family History  Problem Relation Age of Onset  . Diabetes Mother   . Hypertension Mother   . Heart failure Father   . Stroke Father   . Diabetes Father   . Heart failure Brother    History  Substance Use Topics  . Smoking status: Current Every Day Smoker -- 0.50 packs/day for 20 years    Types: Cigarettes  . Smokeless tobacco: Never Used  . Alcohol Use: Yes     Comment: occasionally    Review of Systems  Neurological: Positive for  seizures.  All other systems reviewed and are negative.   A complete 10 system review of systems was obtained and all systems are negative except as noted in the HPI and PMH.    Allergies  Morphine and related; Penicillins; Tramadol; and Ibuprofen  Home Medications   Current Outpatient Rx  Name  Route  Sig  Dispense  Refill  . albuterol (PROVENTIL) (2.5 MG/3ML) 0.083% nebulizer solution   Nebulization   Take 3 mLs (2.5 mg total) by nebulization every 6 (six) hours as needed for wheezing.   75 mL   1   . azithromycin (ZITHROMAX Z-PAK) 250 MG tablet      Take two tablets PO today and then one tablet daily until finished   6 each   0   . benazepril-hydrochlorthiazide (LOTENSIN HCT) 20-25 MG per tablet   Oral   Take 1 tablet by mouth daily.         . divalproex (DEPAKOTE ER) 500 MG 24 hr tablet   Oral   Take 500 mg by mouth daily.         Marland Kitchen HYDROcodone-acetaminophen (NORCO/VICODIN) 5-325 MG per tablet   Oral   Take 1 tablet by mouth every 4 (four) hours as needed for pain.         . phenytoin (DILANTIN) 100 MG ER capsule   Oral   Take 300 mg by mouth every morning.           Marland Kitchen  nitroGLYCERIN (NITROSTAT) 0.4 MG SL tablet   Sublingual   Place 0.4 mg under the tongue every 5 (five) minutes as needed.          BP 130/76  Pulse 100  Temp(Src) 98 F (36.7 C) (Oral)  Resp 22  Ht 5' 10.5" (1.791 m)  Wt 280 lb (127.007 kg)  BMI 39.59 kg/m2  SpO2 96% Physical Exam  Nursing note and vitals reviewed. Constitutional: He is oriented to person, place, and time. He appears well-developed and well-nourished. No distress.  HENT:  Head: Normocephalic and atraumatic.  No tongue biting    Eyes: EOM are normal.  Neck: Normal range of motion and full passive range of motion without pain. Neck supple. No spinous process tenderness and no muscular tenderness present. No tracheal deviation present.  Cardiovascular: Normal rate.   Pulmonary/Chest: Effort normal. No  respiratory distress.  Genitourinary:  No incontinence   Musculoskeletal: Normal range of motion.  No midline tenderness   Neurological: He is alert and oriented to person, place, and time.  CN 2-12 intact, no ataxia on finger to nose, no nystagmus, 5/5 strength throughout, no pronator drift, Romberg negative, normal gait.    Skin: Skin is warm and dry.  Psychiatric: He has a normal mood and affect. His behavior is normal.    ED Course  Procedures (including critical care time) Medications  fosPHENYtoin (CEREBYX) 1,905 mg PE in sodium chloride 0.9 % 50 mL IVPB (not administered)    Labs Review Labs Reviewed  CBC WITH DIFFERENTIAL - Abnormal; Notable for the following:    WBC 12.2 (*)    Lymphs Abs 4.8 (*)    All other components within normal limits  COMPREHENSIVE METABOLIC PANEL - Abnormal; Notable for the following:    Sodium 133 (*)    ALT 56 (*)    All other components within normal limits  VALPROIC ACID LEVEL - Abnormal; Notable for the following:    Valproic Acid Lvl 0.0 (*)    All other components within normal limits  PHENYTOIN LEVEL, TOTAL - Abnormal; Notable for the following:    Phenytoin Lvl 1.4 (*)    All other components within normal limits  TROPONIN I  URINE RAPID DRUG SCREEN (HOSP PERFORMED)  URINALYSIS, ROUTINE W REFLEX MICROSCOPIC   Imaging Review Ct Head Wo Contrast  01/23/2013   *RADIOLOGY REPORT*  Clinical Data: 32 year old male with seizure.  CT HEAD WITHOUT CONTRAST  Technique:  Contiguous axial images were obtained from the base of the skull through the vertex without contrast.  Comparison: 11/19/2010 and prior head CTs dating back to 01/24/2004  Findings: No acute intracranial abnormalities are identified, including mass lesion or mass effect, hydrocephalus, extra-axial fluid collection, midline shift, hemorrhage, or acute infarction.  No acute bony abnormalities are present.  IMPRESSION: No acute intracranial abnormality.   Original Report  Authenticated By: Harmon Pier, M.D.    MDM   1. Seizure disorder    Witnessed seizure with history of seizure disorder. Questionable compliance with medications. Awake, alert, no tongue biting or incontinence. Patient complains of headache. Denies any other pain.  Patient's Depakote and Dilantin levels are undetectable. He admits to poor compliance. Loading dose of Dilantin ordered. Discussed with on-call neurologist Dr. Gerilyn Pilgrim who agrees with Dilantin load and restarting home doses. He does not recommendDepakote loading dose.  CT head is negative. Patient tolerating by mouth ambulatory in ED. He is advised to take his seizure medications as prescribed. He states he doesn't like taking them because  the pills are too big. He will followup this week with neurology and his PCP.   Date: 01/23/2013  Rate: 89  Rhythm: normal sinus rhythm  QRS Axis: normal  Intervals: normal  ST/T Wave abnormalities: normal  Conduction Disutrbances:none  Narrative Interpretation:   Old EKG Reviewed: unchanged     I personally performed the services described in this documentation, which was scribed in my presence. The recorded information has been reviewed and is accurate.    Chad Octave, MD 01/24/13 [phone]

## 2013-01-23 NOTE — ED Notes (Signed)
Tolerated po fluids - requesting a snack,  Given pack of Peanut Butter Crackers

## 2013-01-24 MED ORDER — DIPHENHYDRAMINE HCL 50 MG/ML IJ SOLN
25.0000 mg | Freq: Once | INTRAMUSCULAR | Status: AC
  Administered 2013-01-24: 25 mg via INTRAVENOUS
  Filled 2013-01-24: qty 1

## 2013-01-24 NOTE — ED Notes (Signed)
Patient c/o sudden onset of itching all over.  IV site no reaction.  No urticaria noted. No difficulty breathing.  MD informed and orders received to give Benadryl per order and complete the infusion.

## 2013-01-24 NOTE — ED Notes (Signed)
Itching stopped now that IV has infused.

## 2013-03-03 ENCOUNTER — Encounter (HOSPITAL_COMMUNITY): Payer: Self-pay | Admitting: Emergency Medicine

## 2013-03-03 ENCOUNTER — Emergency Department (HOSPITAL_COMMUNITY)
Admission: EM | Admit: 2013-03-03 | Discharge: 2013-03-03 | Disposition: A | Discharge status: Home / Self Care | Payer: Medicare Other | Attending: Emergency Medicine | Admitting: Emergency Medicine

## 2013-03-03 DIAGNOSIS — Z880 Allergy status to penicillin: Secondary | ICD-10-CM | POA: Diagnosis not present

## 2013-03-03 DIAGNOSIS — I10 Essential (primary) hypertension: Secondary | ICD-10-CM | POA: Diagnosis not present

## 2013-03-03 DIAGNOSIS — H60399 Other infective otitis externa, unspecified ear: Secondary | ICD-10-CM | POA: Insufficient documentation

## 2013-03-03 DIAGNOSIS — I209 Angina pectoris, unspecified: Secondary | ICD-10-CM | POA: Diagnosis not present

## 2013-03-03 DIAGNOSIS — J45909 Unspecified asthma, uncomplicated: Secondary | ICD-10-CM | POA: Insufficient documentation

## 2013-03-03 DIAGNOSIS — G40909 Epilepsy, unspecified, not intractable, without status epilepticus: Secondary | ICD-10-CM | POA: Diagnosis not present

## 2013-03-03 DIAGNOSIS — Z792 Long term (current) use of antibiotics: Secondary | ICD-10-CM | POA: Diagnosis not present

## 2013-03-03 DIAGNOSIS — I2510 Atherosclerotic heart disease of native coronary artery without angina pectoris: Secondary | ICD-10-CM | POA: Insufficient documentation

## 2013-03-03 DIAGNOSIS — Z79899 Other long term (current) drug therapy: Secondary | ICD-10-CM | POA: Diagnosis not present

## 2013-03-03 DIAGNOSIS — F17200 Nicotine dependence, unspecified, uncomplicated: Secondary | ICD-10-CM | POA: Insufficient documentation

## 2013-03-03 DIAGNOSIS — H6092 Unspecified otitis externa, left ear: Secondary | ICD-10-CM

## 2013-03-03 MED ORDER — NEOMYCIN-POLYMYXIN-HC 1 % OT SOLN
3.0000 [drp] | Freq: Once | OTIC | Status: AC
  Administered 2013-03-03: 3 [drp] via OTIC
  Filled 2013-03-03: qty 10

## 2013-03-03 MED ORDER — ANTIPYRINE-BENZOCAINE 5.4-1.4 % OT SOLN
3.0000 [drp] | Freq: Once | OTIC | Status: AC
  Administered 2013-03-03: 4 [drp] via OTIC
  Filled 2013-03-03: qty 10

## 2013-03-03 NOTE — ED Notes (Signed)
Pt reports left ear draining pus for 2 days.

## 2013-03-03 NOTE — ED Provider Notes (Signed)
CSN: [id number]     Arrival date & time 03/03/13  1612 History   First MD Initiated Contact with Patient 03/03/13 1709     Chief Complaint  Patient presents with  . Ear Drainage   (Consider location/radiation/quality/duration/timing/severity/associated sxs/prior Treatment) HPI Comments: Chad Hampton is a 32 y.o. Male presenting with left ear pain and purulent drainage which started 2 days ago.  He denies fevers or chills and has had no nasal congestion or drainage, sore throat cough or other URI type symptoms.  His pain is constant and throbbing, and worsens with the external ear.  He has found no alleviators for his symptoms.    The history is provided by the patient.    Past Medical History  Diagnosis Date  . Seizure disorder   . Asthma   . Hypertension   . Acute angina   . CAD (coronary artery disease)   . Seizures    Past Surgical History  Procedure Laterality Date  . Cholecystectomy    . Eye surgery    . Facial reconstruction surgery     Family History  Problem Relation Age of Onset  . Diabetes Mother   . Hypertension Mother   . Heart failure Father   . Stroke Father   . Diabetes Father   . Heart failure Brother    History  Substance Use Topics  . Smoking status: Current Every Day Smoker -- 0.50 packs/day for 20 years    Types: Cigarettes  . Smokeless tobacco: Never Used  . Alcohol Use: Yes     Comment: occasionally    Review of Systems  Constitutional: Negative for fever and chills.  HENT: Positive for congestion, ear discharge and ear pain. Negative for rhinorrhea, sinus pressure, sore throat, trouble swallowing and voice change.   Eyes: Negative for discharge.  Respiratory: Negative for cough, shortness of breath, wheezing and stridor.   Cardiovascular: Negative for chest pain.  Gastrointestinal: Negative for abdominal pain.  Genitourinary: Negative.     Allergies  Morphine and related; Penicillins; Tramadol; and Ibuprofen  Home Medications    Current Outpatient Rx  Name  Route  Sig  Dispense  Refill  . albuterol (PROVENTIL) (2.5 MG/3ML) 0.083% nebulizer solution   Nebulization   Take 3 mLs (2.5 mg total) by nebulization every 6 (six) hours as needed for wheezing.   75 mL   1   . azithromycin (ZITHROMAX Z-PAK) 250 MG tablet      Take two tablets PO today and then one tablet daily until finished   6 each   0   . benazepril-hydrochlorthiazide (LOTENSIN HCT) 20-25 MG per tablet   Oral   Take 1 tablet by mouth daily.         . divalproex (DEPAKOTE ER) 500 MG 24 hr tablet   Oral   Take 500 mg by mouth daily.         Marland Kitchen HYDROcodone-acetaminophen (NORCO/VICODIN) 5-325 MG per tablet   Oral   Take 1 tablet by mouth every 4 (four) hours as needed for pain.         . nitroGLYCERIN (NITROSTAT) 0.4 MG SL tablet   Sublingual   Place 0.4 mg under the tongue every 5 (five) minutes as needed.         . phenytoin (DILANTIN) 100 MG ER capsule   Oral   Take 300 mg by mouth every morning.            BP 149/98  Pulse 93  Temp(Src)  98.6 F (37 C) (Oral)  Resp 20  Ht 5' 10.5" (1.791 m)  Wt 293 lb (132.904 kg)  BMI 41.43 kg/m2  SpO2 98% Physical Exam  Constitutional: He is oriented to person, place, and time. He appears well-developed and well-nourished.  HENT:  Head: Normocephalic and atraumatic.  Right Ear: Tympanic membrane and ear canal normal.  Left Ear: Tympanic membrane normal. There is drainage. No mastoid tenderness. Tympanic membrane is not injected, not erythematous, not retracted and not bulging.  No middle ear effusion.  Nose: Mucosal edema and rhinorrhea present.  Mouth/Throat: Uvula is midline, oropharynx is clear and moist and mucous membranes are normal. No oropharyngeal exudate, posterior oropharyngeal edema, posterior oropharyngeal erythema or tonsillar abscesses.  White liquidy drainage from the left ear canal, and mucosal erythema without significant edema.  TM appears normal.  Eyes:  Conjunctivae are normal.  Cardiovascular: Normal rate and normal heart sounds.   Pulmonary/Chest: Effort normal. No respiratory distress. He has no wheezes. He has no rales.  Abdominal: Soft. There is no tenderness.  Musculoskeletal: Normal range of motion.  Neurological: He is alert and oriented to person, place, and time.  Skin: Skin is warm and dry. No rash noted.  Psychiatric: He has a normal mood and affect.    ED Course  Procedures (including critical care time) Labs Review Labs Reviewed - No data to display Imaging Review No results found.  EKG Interpretation   None       MDM   1. Otitis externa of left ear    Patient given Auralgan for pain, Cortisporin with first dose is given in ED.  He was encouraged recheck by Dr. Suszanne Conners in 1 week if symptoms are not improved.  He is seeing Dr. Suszanne Conners in the past for sinus issues.    Burgess Amor, PA-C 03/03/13 1756

## 2013-03-09 NOTE — ED Provider Notes (Signed)
Medical screening examination/treatment/procedure(s) were performed by non-physician practitioner and as supervising physician I was immediately available for consultation/collaboration.  EKG Interpretation   None        Hurman Horn, MD 03/09/13 [phone]

## 2013-04-18 ENCOUNTER — Encounter (HOSPITAL_COMMUNITY): Payer: Self-pay | Admitting: Emergency Medicine

## 2013-04-18 ENCOUNTER — Emergency Department (HOSPITAL_COMMUNITY)
Admission: EM | Admit: 2013-04-18 | Discharge: 2013-04-19 | Disposition: A | Discharge status: Home / Self Care | Payer: Medicare Other | Attending: Emergency Medicine | Admitting: Emergency Medicine

## 2013-04-18 ENCOUNTER — Emergency Department (HOSPITAL_COMMUNITY): Payer: Medicare Other

## 2013-04-18 DIAGNOSIS — I2510 Atherosclerotic heart disease of native coronary artery without angina pectoris: Secondary | ICD-10-CM | POA: Insufficient documentation

## 2013-04-18 DIAGNOSIS — I10 Essential (primary) hypertension: Secondary | ICD-10-CM | POA: Diagnosis not present

## 2013-04-18 DIAGNOSIS — I209 Angina pectoris, unspecified: Secondary | ICD-10-CM | POA: Insufficient documentation

## 2013-04-18 DIAGNOSIS — Z79899 Other long term (current) drug therapy: Secondary | ICD-10-CM | POA: Diagnosis not present

## 2013-04-18 DIAGNOSIS — W268XXA Contact with other sharp object(s), not elsewhere classified, initial encounter: Secondary | ICD-10-CM | POA: Insufficient documentation

## 2013-04-18 DIAGNOSIS — S91309A Unspecified open wound, unspecified foot, initial encounter: Secondary | ICD-10-CM | POA: Insufficient documentation

## 2013-04-18 DIAGNOSIS — Y9289 Other specified places as the place of occurrence of the external cause: Secondary | ICD-10-CM | POA: Insufficient documentation

## 2013-04-18 DIAGNOSIS — L089 Local infection of the skin and subcutaneous tissue, unspecified: Secondary | ICD-10-CM | POA: Diagnosis not present

## 2013-04-18 DIAGNOSIS — G40909 Epilepsy, unspecified, not intractable, without status epilepticus: Secondary | ICD-10-CM | POA: Insufficient documentation

## 2013-04-18 DIAGNOSIS — Z792 Long term (current) use of antibiotics: Secondary | ICD-10-CM | POA: Diagnosis not present

## 2013-04-18 DIAGNOSIS — F17200 Nicotine dependence, unspecified, uncomplicated: Secondary | ICD-10-CM | POA: Diagnosis not present

## 2013-04-18 DIAGNOSIS — J45909 Unspecified asthma, uncomplicated: Secondary | ICD-10-CM | POA: Insufficient documentation

## 2013-04-18 DIAGNOSIS — Y9389 Activity, other specified: Secondary | ICD-10-CM | POA: Insufficient documentation

## 2013-04-18 MED ORDER — LIDOCAINE HCL (PF) 2 % IJ SOLN
2.0000 mL | Freq: Once | INTRAMUSCULAR | Status: AC
  Administered 2013-04-18: 2 mL
  Filled 2013-04-18: qty 10

## 2013-04-18 NOTE — ED Provider Notes (Signed)
CSN: [id number]     Arrival date & time 04/18/13  2210 History   First MD Initiated Contact with Patient 04/18/13 2259     Chief Complaint  Patient presents with  . Foot Injury   (Consider location/radiation/quality/duration/timing/severity/associated sxs/prior Treatment) HPI Comments: Chad Hampton is a 32 y.o. Male presenting for evaluation of a wound on the plantar surface of his left foot after stepping on the tip of nail embedded in a board 2 days ago.  The nail punctured through the rubber of his shoe into the arch of the left foot.  He has increased pain,  Redness and central swelling and is concerned about infection.  He has found no alleviators and has taken no medicines for this problem.  He is utd on his tetanus.  He denies fevers, chills, myalgias, nausea or vomiting.     The history is provided by the patient and a friend.    Past Medical History  Diagnosis Date  . Seizure disorder   . Asthma   . Hypertension   . Acute angina   . CAD (coronary artery disease)   . Seizures    Past Surgical History  Procedure Laterality Date  . Cholecystectomy    . Eye surgery    . Facial reconstruction surgery     Family History  Problem Relation Age of Onset  . Diabetes Mother   . Hypertension Mother   . Heart failure Father   . Stroke Father   . Diabetes Father   . Heart failure Brother    History  Substance Use Topics  . Smoking status: Current Every Day Smoker -- 0.50 packs/day for 20 years    Types: Cigarettes  . Smokeless tobacco: Never Used  . Alcohol Use: Yes     Comment: occasionally    Review of Systems  Constitutional: Negative for fever and chills.  HENT: Negative for facial swelling.   Respiratory: Negative for shortness of breath and wheezing.   Skin: Positive for wound.  Neurological: Negative for numbness.    Allergies  Morphine and related; Penicillins; Tramadol; and Ibuprofen  Home Medications   Current Outpatient Rx  Name  Route  Sig   Dispense  Refill  . albuterol (PROVENTIL) (2.5 MG/3ML) 0.083% nebulizer solution   Nebulization   Take 3 mLs (2.5 mg total) by nebulization every 6 (six) hours as needed for wheezing.   75 mL   1   . benazepril-hydrochlorthiazide (LOTENSIN HCT) 20-25 MG per tablet   Oral   Take 1 tablet by mouth daily.         . divalproex (DEPAKOTE ER) 500 MG 24 hr tablet   Oral   Take 500 mg by mouth daily.         . phenytoin (DILANTIN) 100 MG ER capsule   Oral   Take 300 mg by mouth every morning.           . ciprofloxacin (CIPRO) 500 MG tablet   Oral   Take 1 tablet (500 mg total) by mouth 2 (two) times daily.   20 tablet   0   . HYDROcodone-acetaminophen (NORCO/VICODIN) 5-325 MG per tablet   Oral   Take 1 tablet by mouth every 4 (four) hours as needed for moderate pain.   15 tablet   0   . nitroGLYCERIN (NITROSTAT) 0.4 MG SL tablet   Sublingual   Place 0.4 mg under the tongue every 5 (five) minutes as needed for chest pain.  BP 143/86  Pulse 105  Temp(Src) 98.3 F (36.8 C)  Resp 20  Ht 5' 10.5" (1.791 m)  Wt 300 lb 5 oz (136.221 kg)  BMI 42.47 kg/m2  SpO2 97% Physical Exam  Constitutional: He appears well-developed and well-nourished. No distress.  HENT:  Head: Normocephalic.  Neck: Neck supple.  Cardiovascular: Normal rate.   Pulmonary/Chest: Effort normal. He has no wheezes.  Musculoskeletal: Normal range of motion. He exhibits no edema.  Skin: There is erythema.  2 cm erythematous patch on left plantar foot arch.  Central tenting with central scab surrounded by halo of indurated yellow skin.  No spontaneous drainage.  No red streaking.    ED Course  Procedures (including critical care time)   INCISION AND DRAINAGE Performed by: Burgess Amor Consent: Verbal consent obtained. Risks and benefits: risks, benefits and alternatives were discussed Type: abscess  Body area: left foot arch  Anesthesia: local infiltration  Incision was made with a  scalpel.  Local anesthetic: lidocaine 2% without epinephrine  Anesthetic total: 2 ml  Complexity: complex Blunt dissection to break up loculations  Drainage: purulent  Drainage amount: small  Packing material: no packing  Patient tolerance: Patient tolerated the procedure well with no immediate complications.    Labs Review Labs Reviewed - No data to display Imaging Review No results found.  EKG Interpretation   None       MDM   1. Puncture wound of foot excluding toes with infection, left, initial encounter    Patient placed on cipro to give pseudomonas coverage.  He was also prescribed hydrocodone for pain, encouraged warm soaks,  Follow up with pcp for a recheck if not improving over the next 2 days,  Sooner for worsened sx.      Burgess Amor, PA-C 04/19/13 [phone]

## 2013-04-18 NOTE — ED Notes (Signed)
Pt with puncture to mid bottom of left foot after stepping on a nail 2 days ago per pt, states cleaned with peroxide x 2 after it happened, scabbed over, redness noted around site and very tender to touch

## 2013-04-18 NOTE — ED Notes (Signed)
Pt stepped on nail 2 days ago. (left foot)

## 2013-04-19 MED ORDER — CIPROFLOXACIN HCL 250 MG PO TABS
500.0000 mg | ORAL_TABLET | Freq: Once | ORAL | Status: AC
  Administered 2013-04-19: 500 mg via ORAL
  Filled 2013-04-19: qty 2

## 2013-04-19 MED ORDER — HYDROCODONE-ACETAMINOPHEN 5-325 MG PO TABS: 1.0000 | ORAL_TABLET | ORAL | Status: DC | PRN

## 2013-04-19 MED ORDER — CIPROFLOXACIN HCL 500 MG PO TABS: 500.0000 mg | ORAL_TABLET | Freq: Two times a day (BID) | ORAL | Status: DC

## 2013-04-19 NOTE — ED Provider Notes (Signed)
Medical screening examination/treatment/procedure(s) were performed by non-physician practitioner and as supervising physician I was immediately available for consultation/collaboration.     Geoffery Lyons, MD 04/19/13 [phone]

## 2013-05-23 ENCOUNTER — Emergency Department (HOSPITAL_COMMUNITY)
Admission: EM | Admit: 2013-05-23 | Discharge: 2013-05-23 | Disposition: A | Discharge status: Home / Self Care | Payer: Medicare Other | Attending: Emergency Medicine | Admitting: Emergency Medicine

## 2013-05-23 DIAGNOSIS — J45909 Unspecified asthma, uncomplicated: Secondary | ICD-10-CM | POA: Insufficient documentation

## 2013-05-23 DIAGNOSIS — H60399 Other infective otitis externa, unspecified ear: Secondary | ICD-10-CM | POA: Diagnosis not present

## 2013-05-23 DIAGNOSIS — I2510 Atherosclerotic heart disease of native coronary artery without angina pectoris: Secondary | ICD-10-CM | POA: Diagnosis not present

## 2013-05-23 DIAGNOSIS — Z9089 Acquired absence of other organs: Secondary | ICD-10-CM | POA: Diagnosis not present

## 2013-05-23 DIAGNOSIS — I10 Essential (primary) hypertension: Secondary | ICD-10-CM | POA: Insufficient documentation

## 2013-05-23 DIAGNOSIS — G40909 Epilepsy, unspecified, not intractable, without status epilepticus: Secondary | ICD-10-CM | POA: Diagnosis not present

## 2013-05-23 DIAGNOSIS — Z79899 Other long term (current) drug therapy: Secondary | ICD-10-CM | POA: Diagnosis not present

## 2013-05-23 DIAGNOSIS — I209 Angina pectoris, unspecified: Secondary | ICD-10-CM | POA: Insufficient documentation

## 2013-05-23 DIAGNOSIS — H6090 Unspecified otitis externa, unspecified ear: Secondary | ICD-10-CM

## 2013-05-23 DIAGNOSIS — Z880 Allergy status to penicillin: Secondary | ICD-10-CM | POA: Diagnosis not present

## 2013-05-23 DIAGNOSIS — F17200 Nicotine dependence, unspecified, uncomplicated: Secondary | ICD-10-CM | POA: Diagnosis not present

## 2013-05-23 MED ORDER — CIPROFLOXACIN-DEXAMETHASONE 0.3-0.1 % OT SUSP: 4.0000 [drp] | Freq: Two times a day (BID) | OTIC | Status: DC

## 2013-05-23 NOTE — ED Provider Notes (Signed)
CSN: [id number]     Arrival date & time 05/23/13  0114 History   First MD Initiated Contact with Patient 05/23/13 0121     Chief Complaint  Patient presents with  . Otalgia    left ear pain for 2 days with drainage   (Consider location/radiation/quality/duration/timing/severity/associated sxs/prior Treatment) HPI Comments: 33 year old male, presents with a complaint of left ear pain. He states this pain is subacute, has been going on for months, is only felt when he cleans his ear with a Q-tip. He does not have her pain at rest. He has had chronic ear infections and states that he has been told by the otolaryngologist that he had an ear infection, otitis externa that required antibiotics. He has been using topical antibiotics for months, he is currently out of this. He has no change in his hearing, no fever, denies pain with touching his outer ear. He has no headache, no change in vision, no difficulty swallowing, no fevers or chills  Patient is a 33 y.o. male presenting with ear pain. The history is provided by the patient.  Otalgia Associated symptoms: no fever     Past Medical History  Diagnosis Date  . Seizure disorder   . Asthma   . Hypertension   . Acute angina   . CAD (coronary artery disease)   . Seizures    Past Surgical History  Procedure Laterality Date  . Cholecystectomy    . Eye surgery    . Facial reconstruction surgery     Family History  Problem Relation Age of Onset  . Diabetes Mother   . Hypertension Mother   . Heart failure Father   . Stroke Father   . Diabetes Father   . Heart failure Brother    History  Substance Use Topics  . Smoking status: Current Every Day Smoker -- 0.50 packs/day for 20 years    Types: Cigarettes  . Smokeless tobacco: Never Used  . Alcohol Use: Yes     Comment: occasionally    Review of Systems  Constitutional: Negative for fever.  HENT: Positive for ear pain.     Allergies  Morphine and related; Penicillins; Tramadol;  and Ibuprofen  Home Medications   Current Outpatient Rx  Name  Route  Sig  Dispense  Refill  . albuterol (PROVENTIL) (2.5 MG/3ML) 0.083% nebulizer solution   Nebulization   Take 3 mLs (2.5 mg total) by nebulization every 6 (six) hours as needed for wheezing.   75 mL   1   . benazepril-hydrochlorthiazide (LOTENSIN HCT) 20-25 MG per tablet   Oral   Take 1 tablet by mouth daily.         . divalproex (DEPAKOTE ER) 500 MG 24 hr tablet   Oral   Take 500 mg by mouth daily.         . nitroGLYCERIN (NITROSTAT) 0.4 MG SL tablet   Sublingual   Place 0.4 mg under the tongue every 5 (five) minutes as needed for chest pain.          . phenytoin (DILANTIN) 100 MG ER capsule   Oral   Take 300 mg by mouth every morning.           . ciprofloxacin (CIPRO) 500 MG tablet   Oral   Take 1 tablet (500 mg total) by mouth 2 (two) times daily.   20 tablet   0   . ciprofloxacin-dexamethasone (CIPRODEX) otic suspension   Left Ear   Place 4 drops into the  left ear 2 (two) times daily.   7.5 mL   0   . HYDROcodone-acetaminophen (NORCO/VICODIN) 5-325 MG per tablet   Oral   Take 1 tablet by mouth every 4 (four) hours as needed for moderate pain.   15 tablet   0    BP 143/90  Pulse 84  Temp(Src) 98.4 F (36.9 C)  Resp 20  Wt 300 lb (136.079 kg)  SpO2 100% Physical Exam  Nursing note and vitals reviewed. Constitutional: He appears well-developed and well-nourished. No distress.  HENT:  Head: Normocephalic and atraumatic.  Mouth/Throat: Oropharynx is clear and moist. No oropharyngeal exudate.  Right ear external auditory meatus and tympanic membrane are normal, left tympanic membrane is not seen secondary to desquamation in the external auditory canal on the left. There is no tenderness with manipulation of the auricle or the tragus on the left. There is no bleeding or purulent or foul-smelling drainage  Eyes: Conjunctivae are normal. No scleral icterus.  Neck: Normal range of  motion. Neck supple. No thyromegaly present.  Cardiovascular: Normal rate and regular rhythm.   Pulmonary/Chest: Effort normal and breath sounds normal.  Lymphadenopathy:    He has no cervical adenopathy.  Neurological: He is alert.  Skin: Skin is warm and dry. No rash noted. He is not diaphoretic.    ED Course  Procedures (including critical care time) Labs Review Labs Reviewed - No data to display Imaging Review No results found.  EKG Interpretation   None       MDM   1. Otitis externa    The patient has an exam consistent with a possible otitis externa however this appears to be a chronic finding and I question whether there is some other process going on. In the emergency sitting there is no other interventions or evaluation that I have to offer, I will refer him back to ear nose and throat specialist, restart antibiotics until that time, patient informed of the need for close followup and is agreeable.   Meds given in ED:  Medications - No data to display  New Prescriptions   CIPROFLOXACIN-DEXAMETHASONE (CIPRODEX) OTIC SUSPENSION    Place 4 drops into the left ear 2 (two) times daily.        Johnna Acosta, MD 05/23/13 [phone]

## 2013-05-23 NOTE — Discharge Instructions (Signed)
You have some chronic findings in your last year. This may not be related to an infection, please recheck with your ear nose and throat specialist this week, followup with your family doctor for referral as needed, use the eardrops twice today that I have given him in prescription form and do not use Q-tips in your ear until cleared by the specialist  Please call your doctor for a followup appointment within 24-48 hours. When you talk to your doctor please let them know that you were seen in the emergency department and have them acquire all of your records so that they can discuss the findings with you and formulate a treatment plan to fully care for your new and ongoing problems.

## 2013-06-16 ENCOUNTER — Encounter (HOSPITAL_COMMUNITY): Payer: Self-pay | Admitting: Emergency Medicine

## 2013-06-16 ENCOUNTER — Emergency Department (HOSPITAL_COMMUNITY)
Admission: EM | Admit: 2013-06-16 | Discharge: 2013-06-16 | Disposition: A | Discharge status: Home / Self Care | Payer: Medicare Other | Attending: Emergency Medicine | Admitting: Emergency Medicine

## 2013-06-16 ENCOUNTER — Emergency Department (HOSPITAL_COMMUNITY): Payer: Medicare Other

## 2013-06-16 DIAGNOSIS — Y9301 Activity, walking, marching and hiking: Secondary | ICD-10-CM | POA: Insufficient documentation

## 2013-06-16 DIAGNOSIS — Y92410 Unspecified street and highway as the place of occurrence of the external cause: Secondary | ICD-10-CM | POA: Diagnosis not present

## 2013-06-16 DIAGNOSIS — G40909 Epilepsy, unspecified, not intractable, without status epilepticus: Secondary | ICD-10-CM | POA: Diagnosis not present

## 2013-06-16 DIAGNOSIS — J45909 Unspecified asthma, uncomplicated: Secondary | ICD-10-CM | POA: Insufficient documentation

## 2013-06-16 DIAGNOSIS — I10 Essential (primary) hypertension: Secondary | ICD-10-CM | POA: Diagnosis not present

## 2013-06-16 DIAGNOSIS — Z880 Allergy status to penicillin: Secondary | ICD-10-CM | POA: Diagnosis not present

## 2013-06-16 DIAGNOSIS — S5000XA Contusion of unspecified elbow, initial encounter: Secondary | ICD-10-CM | POA: Diagnosis not present

## 2013-06-16 DIAGNOSIS — F17200 Nicotine dependence, unspecified, uncomplicated: Secondary | ICD-10-CM | POA: Insufficient documentation

## 2013-06-16 DIAGNOSIS — IMO0002 Reserved for concepts with insufficient information to code with codable children: Secondary | ICD-10-CM | POA: Diagnosis not present

## 2013-06-16 DIAGNOSIS — I2510 Atherosclerotic heart disease of native coronary artery without angina pectoris: Secondary | ICD-10-CM | POA: Diagnosis not present

## 2013-06-16 DIAGNOSIS — S5002XA Contusion of left elbow, initial encounter: Secondary | ICD-10-CM

## 2013-06-16 DIAGNOSIS — Z79899 Other long term (current) drug therapy: Secondary | ICD-10-CM | POA: Insufficient documentation

## 2013-06-16 DIAGNOSIS — S6990XA Unspecified injury of unspecified wrist, hand and finger(s), initial encounter: Secondary | ICD-10-CM | POA: Diagnosis not present

## 2013-06-16 DIAGNOSIS — Z792 Long term (current) use of antibiotics: Secondary | ICD-10-CM | POA: Insufficient documentation

## 2013-06-16 DIAGNOSIS — I209 Angina pectoris, unspecified: Secondary | ICD-10-CM | POA: Diagnosis not present

## 2013-06-16 DIAGNOSIS — M25529 Pain in unspecified elbow: Secondary | ICD-10-CM | POA: Diagnosis not present

## 2013-06-16 DIAGNOSIS — S59909A Unspecified injury of unspecified elbow, initial encounter: Secondary | ICD-10-CM | POA: Diagnosis present

## 2013-06-16 DIAGNOSIS — S59919A Unspecified injury of unspecified forearm, initial encounter: Secondary | ICD-10-CM | POA: Diagnosis not present

## 2013-06-16 MED ORDER — HYDROCODONE-ACETAMINOPHEN 5-325 MG PO TABS: 1.0000 | ORAL_TABLET | Freq: Four times a day (QID) | ORAL | Status: DC | PRN

## 2013-06-16 MED ORDER — HYDROCODONE-ACETAMINOPHEN 5-325 MG PO TABS
2.0000 | ORAL_TABLET | Freq: Once | ORAL | Status: AC
  Administered 2013-06-16: 2 via ORAL
  Filled 2013-06-16: qty 2
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
## 2013-06-16 NOTE — Discharge Instructions (Signed)
Elbow Contusion °An elbow contusion is a deep bruise of the elbow. Contusions are the result of an injury that caused bleeding under the skin. The contusion may turn blue, purple, or yellow. Minor injuries will give you a painless contusion, but more severe contusions may stay painful and swollen for a few weeks.  °CAUSES  °An elbow contusion comes from a direct force to that area, such as falling on the elbow. °SYMPTOMS  °· Swelling and redness of the elbow. °· Bruising of the elbow area. °· Tenderness or soreness of the elbow. °DIAGNOSIS  °You will have a physical exam and will be asked about your history. You may need an X-ray of your elbow to look for a broken bone (fracture).  °TREATMENT  °A sling or splint may be needed to support your injury. Resting, elevating, and applying cold compresses to the elbow area are often the best treatments for an elbow contusion. Over-the-counter medicines may also be recommended for pain control. °HOME CARE INSTRUCTIONS  °· Put ice on the injured area. °· Put ice in a plastic bag. °· Place a towel between your skin and the bag. °· Leave the ice on for 15-20 minutes, 03-04 times a day. °· Only take over-the-counter or prescription medicines for pain, discomfort, or fever as directed by your caregiver. °· Rest your injured elbow until the pain and swelling are better. °· Elevate your elbow to reduce swelling. °· Apply a compression wrap as directed by your caregiver. This can help reduce swelling and motion. You may remove the wrap for sleeping, showers, and baths. If your fingers become numb, cold, or blue, take the wrap off and reapply it more loosely. °· Use your elbow only as directed by your caregiver. You may be asked to do range of motion exercises. Do them as directed. °· See your caregiver as directed. It is very important to keep all follow-up appointments in order to avoid any long-term problems with your elbow, including chronic pain or inability to move your elbow  normally. °SEEK IMMEDIATE MEDICAL CARE IF:  °· You have increased redness, swelling, or pain in your elbow. °· Your swelling or pain is not relieved with medicines. °· You have swelling of the hand and fingers. °· You are unable to move your fingers or wrist. °· You begin to lose feeling in your hand or fingers. °· Your fingers or hand become cold or blue. °MAKE SURE YOU:  °· Understand these instructions. °· Will watch your condition. °· Will get help right away if you are not doing well or get worse. °Document Released: 03/24/2006 Document Revised: 07/08/2011 Document Reviewed: 03/01/2011 °[employer]® Patient Information ©2014 [employer]. ° °

## 2013-06-22 ENCOUNTER — Encounter (HOSPITAL_COMMUNITY): Payer: Self-pay | Admitting: Emergency Medicine

## 2013-06-22 ENCOUNTER — Emergency Department (HOSPITAL_COMMUNITY)
Admission: EM | Admit: 2013-06-22 | Discharge: 2013-06-22 | Disposition: A | Discharge status: Home / Self Care | Payer: Medicare Other | Attending: Emergency Medicine | Admitting: Emergency Medicine

## 2013-06-22 DIAGNOSIS — I2510 Atherosclerotic heart disease of native coronary artery without angina pectoris: Secondary | ICD-10-CM | POA: Insufficient documentation

## 2013-06-22 DIAGNOSIS — Z79899 Other long term (current) drug therapy: Secondary | ICD-10-CM | POA: Diagnosis not present

## 2013-06-22 DIAGNOSIS — M5412 Radiculopathy, cervical region: Secondary | ICD-10-CM | POA: Diagnosis not present

## 2013-06-22 DIAGNOSIS — M5410 Radiculopathy, site unspecified: Secondary | ICD-10-CM

## 2013-06-22 DIAGNOSIS — M79609 Pain in unspecified limb: Secondary | ICD-10-CM | POA: Diagnosis present

## 2013-06-22 DIAGNOSIS — G40909 Epilepsy, unspecified, not intractable, without status epilepticus: Secondary | ICD-10-CM | POA: Insufficient documentation

## 2013-06-22 DIAGNOSIS — IMO0002 Reserved for concepts with insufficient information to code with codable children: Secondary | ICD-10-CM | POA: Insufficient documentation

## 2013-06-22 DIAGNOSIS — J45909 Unspecified asthma, uncomplicated: Secondary | ICD-10-CM | POA: Diagnosis not present

## 2013-06-22 DIAGNOSIS — F17200 Nicotine dependence, unspecified, uncomplicated: Secondary | ICD-10-CM | POA: Diagnosis not present

## 2013-06-22 DIAGNOSIS — I209 Angina pectoris, unspecified: Secondary | ICD-10-CM | POA: Insufficient documentation

## 2013-06-22 DIAGNOSIS — Z880 Allergy status to penicillin: Secondary | ICD-10-CM | POA: Insufficient documentation

## 2013-06-22 DIAGNOSIS — I10 Essential (primary) hypertension: Secondary | ICD-10-CM | POA: Insufficient documentation

## 2013-06-22 DIAGNOSIS — Z792 Long term (current) use of antibiotics: Secondary | ICD-10-CM | POA: Insufficient documentation

## 2013-06-22 MED ORDER — PREDNISONE 10 MG PO TABS: ORAL_TABLET | ORAL | Status: DC

## 2013-06-22 MED ORDER — HYDROCODONE-ACETAMINOPHEN 5-325 MG PO TABS: 1.0000 | ORAL_TABLET | ORAL | Status: DC | PRN

## 2013-06-22 MED ORDER — PREDNISONE 10 MG PO TABS
60.0000 mg | ORAL_TABLET | Freq: Once | ORAL | Status: AC
  Administered 2013-06-22: 18:00:00 60 mg via ORAL
  Filled 2013-06-22 (×2): qty 1
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
## 2013-06-22 NOTE — Discharge Instructions (Signed)
Radicular Pain Radicular pain in either the arm or leg is usually from a bulging or herniated disk in the spine. A piece of the herniated disk may press against the nerves as the nerves exit the spine. This causes pain which is felt at the tips of the nerves down the arm or leg. Other causes of radicular pain may include:  Fractures.  Heart disease.  Cancer.  An abnormal and usually degenerative state of the nervous system or nerves (neuropathy). Diagnosis may require CT or MRI scanning to determine the primary cause.  Nerves that start at the neck (nerve roots) may cause radicular pain in the outer shoulder and arm. It can spread down to the thumb and fingers. The symptoms vary depending on which nerve root has been affected. In most cases radicular pain improves with conservative treatment. Neck problems may require physical therapy, a neck collar, or cervical traction. Treatment may take many weeks, and surgery may be considered if the symptoms do not improve.  Conservative treatment is also recommended for sciatica. Sciatica causes pain to radiate from the lower back or buttock area down the leg into the foot. Often there is a history of back problems. Most patients with sciatica are better after 2 to 4 weeks of rest and other supportive care. Short term bed rest can reduce the disk pressure considerably. Sitting, however, is not a good position since this increases the pressure on the disk. You should avoid bending, lifting, and all other activities which make the problem worse. Traction can be used in severe cases. Surgery is usually reserved for patients who do not improve within the first months of treatment. Only take over-the-counter or prescription medicines for pain, discomfort, or fever as directed by your caregiver. Narcotics and muscle relaxants may help by relieving more severe pain and spasm and by providing mild sedation. Cold or massage can give significant relief. Spinal manipulation  is not recommended. It can increase the degree of disc protrusion. Epidural steroid injections are often effective treatment for radicular pain. These injections deliver medicine to the spinal nerve in the space between the protective covering of the spinal cord and back bones (vertebrae). Your caregiver can give you more information about steroid injections. These injections are most effective when given within two weeks of the onset of pain.  You should see your caregiver for follow up care as recommended. A program for neck and back injury rehabilitation with stretching and strengthening exercises is an important part of management.  SEEK IMMEDIATE MEDICAL CARE IF:  You develop increased pain, weakness, or numbness in your arm or leg.  You develop difficulty with bladder or bowel control.  You develop abdominal pain. Document Released: 05/23/2004 Document Revised: 07/08/2011 Document Reviewed: 08/08/2008 [facility] Patient Information 2014 [place], [place].   You may continue using your sling for comfort.  Start taking the prednisone, your first home dose taken tomorrow evening as you have had todays dose here.  Use a heating pad applied to your elbow 20 minutes 3 times daily.  Have your elbow recheck by your primary doctor next week. If you continue to have this radiating pain after the steroid course,  You will need to see an orthopedist as originally suggested.  You may take the hydrocodone prescribed for pain relief.  This will make you drowsy - do not drive within 4 hours of taking this medication.

## 2013-06-22 NOTE — ED Provider Notes (Signed)
Medical screening examination/treatment/procedure(s) were performed by non-physician practitioner and as supervising physician I was immediately available for consultation/collaboration.  EKG Interpretation   None         Ezequiel Essex, MD 06/22/13 2146
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
## 2013-08-24 ENCOUNTER — Emergency Department (HOSPITAL_COMMUNITY)
Admission: EM | Admit: 2013-08-24 | Discharge: 2013-08-24 | Disposition: A | Discharge status: Home / Self Care | Payer: Medicare Other | Attending: Emergency Medicine | Admitting: Emergency Medicine

## 2013-08-24 ENCOUNTER — Encounter (HOSPITAL_COMMUNITY): Payer: Self-pay | Admitting: Emergency Medicine

## 2013-08-24 DIAGNOSIS — M7710 Lateral epicondylitis, unspecified elbow: Secondary | ICD-10-CM | POA: Diagnosis not present

## 2013-08-24 DIAGNOSIS — F17200 Nicotine dependence, unspecified, uncomplicated: Secondary | ICD-10-CM | POA: Diagnosis not present

## 2013-08-24 DIAGNOSIS — M7711 Lateral epicondylitis, right elbow: Secondary | ICD-10-CM

## 2013-08-24 DIAGNOSIS — IMO0002 Reserved for concepts with insufficient information to code with codable children: Secondary | ICD-10-CM | POA: Diagnosis not present

## 2013-08-24 DIAGNOSIS — I2510 Atherosclerotic heart disease of native coronary artery without angina pectoris: Secondary | ICD-10-CM | POA: Diagnosis not present

## 2013-08-24 DIAGNOSIS — I209 Angina pectoris, unspecified: Secondary | ICD-10-CM | POA: Insufficient documentation

## 2013-08-24 DIAGNOSIS — Z792 Long term (current) use of antibiotics: Secondary | ICD-10-CM | POA: Diagnosis not present

## 2013-08-24 DIAGNOSIS — J45909 Unspecified asthma, uncomplicated: Secondary | ICD-10-CM | POA: Insufficient documentation

## 2013-08-24 DIAGNOSIS — G40909 Epilepsy, unspecified, not intractable, without status epilepticus: Secondary | ICD-10-CM | POA: Insufficient documentation

## 2013-08-24 DIAGNOSIS — I10 Essential (primary) hypertension: Secondary | ICD-10-CM | POA: Insufficient documentation

## 2013-08-24 DIAGNOSIS — Z79899 Other long term (current) drug therapy: Secondary | ICD-10-CM | POA: Insufficient documentation

## 2013-08-24 DIAGNOSIS — Z880 Allergy status to penicillin: Secondary | ICD-10-CM | POA: Insufficient documentation

## 2013-08-24 MED ORDER — PREDNISONE 10 MG PO TABS: ORAL_TABLET | ORAL | Status: DC

## 2013-08-24 NOTE — ED Notes (Signed)
Pt c/o rt elbow pain X 3 weeks unrelated to any injury that pt is aware of.

## 2013-08-24 NOTE — ED Provider Notes (Signed)
Medical screening examination/treatment/procedure(s) were performed by non-physician practitioner and as supervising physician I was immediately available for consultation/collaboration.    Kathalene Frames, MD 08/24/13 [phone]

## 2013-08-24 NOTE — ED Provider Notes (Signed)
CSN: [id number]     Arrival date & time 08/24/13  1523 History   First MD Initiated Contact with Patient 08/24/13 1633     Chief Complaint  Patient presents with  . Elbow Pain     (Consider location/radiation/quality/duration/timing/severity/associated sxs/prior Treatment) The history is provided by the patient.    Chad Hampton is a 33 y.o. male presenting with a 3 to four-week history of right elbow pain without history of injury.  He describes pain at the lateral only prominence which is worse with palpation and with certain movements such as rotating the forearm and when he extends his wrist.  There is been no swelling, redness or drainage and as mentioned above no history of injury.  He is right-handed and works as a Dealer.  He has tried no medications or treatments for this problem prior to arrival today.     Past Medical History  Diagnosis Date  . Seizure disorder   . Asthma   . Hypertension   . Acute angina   . CAD (coronary artery disease)   . Seizures    Past Surgical History  Procedure Laterality Date  . Cholecystectomy    . Eye surgery    . Facial reconstruction surgery     Family History  Problem Relation Age of Onset  . Diabetes Mother   . Hypertension Mother   . Heart failure Father   . Stroke Father   . Diabetes Father   . Heart failure Brother    History  Substance Use Topics  . Smoking status: Current Every Day Smoker -- 0.50 packs/day for 20 years    Types: Cigarettes  . Smokeless tobacco: Never Used  . Alcohol Use: Yes     Comment: occasionally    Review of Systems  Constitutional: Negative for fever.  Musculoskeletal: Positive for arthralgias. Negative for joint swelling and myalgias.  Neurological: Negative for weakness and numbness.      Allergies  Morphine and related; Penicillins; Tramadol; and Ibuprofen  Home Medications   Prior to Admission medications   Medication Sig Start Date End Date Taking? Authorizing Provider   albuterol (PROVENTIL) (2.5 MG/3ML) 0.083% nebulizer solution Take 3 mLs (2.5 mg total) by nebulization every 6 (six) hours as needed for wheezing. 09/27/12  Yes [place], NP  benazepril-hydrochlorthiazide (LOTENSIN HCT) 20-25 MG per tablet Take 1 tablet by mouth daily.   Yes Historical Provider, MD  divalproex (DEPAKOTE ER) 500 MG 24 hr tablet Take 500 mg by mouth daily.   Yes Historical Provider, MD  ciprofloxacin (CIPRO) 500 MG tablet Take 1 tablet (500 mg total) by mouth 2 (two) times daily. 04/19/13   Evalee Jefferson, PA-C  ciprofloxacin-dexamethasone (CIPRODEX) otic suspension Place 4 drops into the left ear 2 (two) times daily. 05/23/13   Johnna Acosta, MD  HYDROcodone-acetaminophen (NORCO) 5-325 MG per tablet Take 1-2 tablets by mouth every 6 (six) hours as needed (for pain). 06/16/13   Karen Chafe Molpus, MD  HYDROcodone-acetaminophen (NORCO/VICODIN) 5-325 MG per tablet Take 1 tablet by mouth every 4 (four) hours as needed for moderate pain. 04/19/13   Evalee Jefferson, PA-C  HYDROcodone-acetaminophen (NORCO/VICODIN) 5-325 MG per tablet Take 1 tablet by mouth every 4 (four) hours as needed for moderate pain. 06/22/13   Evalee Jefferson, PA-C  nitroGLYCERIN (NITROSTAT) 0.4 MG SL tablet Place 0.4 mg under the tongue every 5 (five) minutes as needed for chest pain.     Historical Provider, MD  phenytoin (DILANTIN) 100 MG ER capsule  Take 300 mg by mouth every morning.      Historical Provider, MD  predniSONE (DELTASONE) 10 MG tablet 6, 5, 4, 3, 2 then 1 tablet by mouth daily for 6 days total. 06/22/13   Evalee Jefferson, PA-C  predniSONE (DELTASONE) 10 MG tablet 6, 5, 4, 3, 2 then 1 tablet by mouth daily for 6 days total. 08/24/13   Evalee Jefferson, PA-C   BP 131/83  Pulse 76  Temp(Src) 97.8 F (36.6 C) (Oral)  Resp 18  Ht 6' (1.829 m)  Wt 285 lb (129.275 kg)  BMI 38.64 kg/m2  SpO2 98% Physical Exam  Constitutional: He appears well-developed and well-nourished.  HENT:  Head: Atraumatic.  Neck: Normal range of motion.   Cardiovascular:  Pulses equal bilaterally  Musculoskeletal: He exhibits tenderness.       Right elbow: He exhibits normal range of motion, no swelling, no effusion, no deformity and no laceration. Tenderness found. Lateral epicondyle tenderness noted.  Pain is increased with supination and pronation and with wrist extension.  Pain radiates from lateral upper condyle to upper dorsal forearm.  No edema, no erythema.  Radial pulses intact.  Distal sensation intact.  Neurological: He is alert. He has normal strength. He displays normal reflexes. No sensory deficit.  Skin: Skin is warm and dry.  Psychiatric: He has a normal mood and affect.    ED Course  Procedures (including critical care time) Labs Review Labs Reviewed - No data to display  Imaging Review No results found.   EKG Interpretation None      MDM   Final diagnoses:  Right tennis elbow    Ace wrap supplied, encouraged rest, avoidance of range of motion which worsens pain.  Patient was given a prednisone taper, encouraged warm compresses.  He has seen Dr. Luna Glasgow in the past for prior orthopedic concerns, he was encouraged to followup with him if his symptoms persist beyond the next week to 10 days.    Evalee Jefferson, PA-C 08/24/13 1649

## 2013-08-24 NOTE — Discharge Instructions (Signed)
Lateral Epicondylitis (Tennis Elbow) with Rehab Lateral epicondylitis involves inflammation and pain around the outer portion of the elbow. The pain is caused by inflammation of the tendons in the forearm that bring back (extend) the wrist. Lateral epicondylittis is also called tennis elbow, because it is very common in tennis players. However, it may occur in any individual who extends the wrist repetitively. If lateral epicondylitis is left untreated, it may become a chronic problem. SYMPTOMS   Pain, tenderness, and inflammation on the outer (lateral) side of the elbow.  Pain or weakness with gripping activities.  Pain that increases with wrist twisting motions (playing tennis, using a screwdriver, opening a door or a jar).  Pain with lifting objects, including a coffee cup. CAUSES  Lateral epicondylitis is caused by inflammation of the tendons that extend the wrist. Causes of injury may include:  Repetitive stress and strain on the muscles and tendons that extend the wrist.  Sudden change in activity level or intensity.  Incorrect grip in racquet sports.  Incorrect grip size of racquet (often too large).  Incorrect hitting position or technique (usually backhand, leading with the elbow).  Using a racket that is too heavy. RISK INCREASES WITH:  Sports or occupations that require repetitive and/or strenuous forearm and wrist movements (tennis, squash, racquetball, carpentry).  Poor wrist and forearm strength and flexibility.  Failure to warm up properly before activity.  Resuming activity before healing, rehabilitation, and conditioning are complete. PREVENTION   Warm up and stretch properly before activity.  Maintain physical fitness:  Strength, flexibility, and endurance.  Cardiovascular fitness.  Wear and use properly fitted equipment.  Learn and use proper technique and have a coach correct improper technique.  Wear a tennis elbow (counterforce) brace. PROGNOSIS   The course of this condition depends on the degree of the injury. If treated properly, acute cases (symptoms lasting less than 4 weeks) are often resolved in 2 to 6 weeks. Chronic (longer lasting cases) often resolve in 3 to 6 months, but may require physical therapy. RELATED COMPLICATIONS   Frequently recurring symptoms, resulting in a chronic problem. Properly treating the problem the first time decreases frequency of recurrence.  Chronic inflammation, scarring tendon degeneration, and partial tendon tear, requiring surgery.  Delayed healing or resolution of symptoms. TREATMENT  Treatment first involves the use of ice and medicine, to reduce pain and inflammation. Strengthening and stretching exercises may help reduce discomfort, if performed regularly. These exercises may be performed at home, if the condition is an acute injury. Chronic cases may require a referral to a physical therapist for evaluation and treatment. Your caregiver may advise a corticosteroid injection, to help reduce inflammation. Rarely, surgery is needed. MEDICATION  If pain medicine is needed, nonsteroidal anti-inflammatory medicines (aspirin and ibuprofen), or other minor pain relievers (acetaminophen), are often advised.  Do not take pain medicine for 7 days before surgery.  Prescription pain relievers may be given, if your caregiver thinks they are needed. Use only as directed and only as much as you need.  Corticosteroid injections may be recommended. These injections should be reserved only for the most severe cases, because they can only be given a certain number of times. HEAT AND COLD  Cold treatment (icing) should be applied for 10 to 15 minutes every 2 to 3 hours for inflammation and pain, and immediately after activity that aggravates your symptoms. Use ice packs or an ice massage.  Heat treatment may be used before performing stretching and strengthening activities prescribed by your   caregiver, physical  therapist, or athletic trainer. Use a heat pack or a warm water soak. SEEK MEDICAL CARE IF: Symptoms get worse or do not improve in 2 weeks, despite treatment. EXERCISES  RANGE OF MOTION (ROM) AND STRETCHING EXERCISES - Epicondylitis, Lateral (Tennis Elbow) These exercises may help you when beginning to rehabilitate your injury. Your symptoms may go away with or without further involvement from your physician, physical therapist or athletic trainer. While completing these exercises, remember:   Restoring tissue flexibility helps normal motion to return to the joints. This allows healthier, less painful movement and activity.  An effective stretch should be held for at least 30 seconds.  A stretch should never be painful. You should only feel a gentle lengthening or release in the stretched tissue. RANGE OF MOTION  Wrist Flexion, Active-Assisted  Extend your right / left elbow with your fingers pointing down.*  Gently pull the back of your hand towards you, until you feel a gentle stretch on the top of your forearm.  Hold this position for __________ seconds. Repeat __________ times. Complete this exercise __________ times per day.  *If directed by your physician, physical therapist or athletic trainer, complete this stretch with your elbow bent, rather than extended. RANGE OF MOTION  Wrist Extension, Active-Assisted  Extend your right / left elbow and turn your palm upwards.*  Gently pull your palm and fingertips back, so your wrist extends and your fingers point more toward the ground.  You should feel a gentle stretch on the inside of your forearm.  Hold this position for __________ seconds. Repeat __________ times. Complete this exercise __________ times per day. *If directed by your physician, physical therapist or athletic trainer, complete this stretch with your elbow bent, rather than extended. STRETCH - Wrist Flexion  Place the back of your right / left hand on a tabletop,  leaving your elbow slightly bent. Your fingers should point away from your body.  Gently press the back of your hand down onto the table by straightening your elbow. You should feel a stretch on the top of your forearm.  Hold this position for __________ seconds. Repeat __________ times. Complete this stretch __________ times per day.  STRETCH  Wrist Extension   Place your right / left fingertips on a tabletop, leaving your elbow slightly bent. Your fingers should point backwards.  Gently press your fingers and palm down onto the table by straightening your elbow. You should feel a stretch on the inside of your forearm.  Hold this position for __________ seconds. Repeat __________ times. Complete this stretch __________ times per day.  STRENGTHENING EXERCISES - Epicondylitis, Lateral (Tennis Elbow) These exercises may help you when beginning to rehabilitate your injury. They may resolve your symptoms with or without further involvement from your physician, physical therapist or athletic trainer. While completing these exercises, remember:   Muscles can gain both the endurance and the strength needed for everyday activities through controlled exercises.  Complete these exercises as instructed by your physician, physical therapist or athletic trainer. Increase the resistance and repetitions only as guided.  You may experience muscle soreness or fatigue, but the pain or discomfort you are trying to eliminate should never worsen during these exercises. If this pain does get worse, stop and make sure you are following the directions exactly. If the pain is still present after adjustments, discontinue the exercise until you can discuss the trouble with your caregiver. STRENGTH Wrist Flexors  Sit with your right / left forearm palm-up and   fully supported on a table or countertop. Your elbow should be resting below the height of your shoulder. Allow your wrist to extend over the edge of the  surface.  Loosely holding a __________ weight, or a piece of rubber exercise band or tubing, slowly curl your hand up toward your forearm.  Hold this position for __________ seconds. Slowly lower the wrist back to the starting position in a controlled manner. Repeat __________ times. Complete this exercise __________ times per day.  STRENGTH  Wrist Extensors  Sit with your right / left forearm palm-down and fully supported on a table or countertop. Your elbow should be resting below the height of your shoulder. Allow your wrist to extend over the edge of the surface.  Loosely holding a __________ weight, or a piece of rubber exercise band or tubing, slowly curl your hand up toward your forearm.  Hold this position for __________ seconds. Slowly lower the wrist back to the starting position in a controlled manner. Repeat __________ times. Complete this exercise __________ times per day.  STRENGTH - Ulnar Deviators  Stand with a ____________________ weight in your right / left hand, or sit while holding a rubber exercise band or tubing, with your healthy arm supported on a table or countertop.  Move your wrist, so that your pinkie travels toward your forearm and your thumb moves away from your forearm.  Hold this position for __________ seconds and then slowly lower the wrist back to the starting position. Repeat __________ times. Complete this exercise __________ times per day STRENGTH - Radial Deviators  Stand with a ____________________ weight in your right / left hand, or sit while holding a rubber exercise band or tubing, with your injured arm supported on a table or countertop.  Raise your hand upward in front of you or pull up on the rubber tubing.  Hold this position for __________ seconds and then slowly lower the wrist back to the starting position. Repeat __________ times. Complete this exercise __________ times per day. STRENGTH  Forearm Supinators   Sit with your right /  left forearm supported on a table, keeping your elbow below shoulder height. Rest your hand over the edge, palm down.  Gently grip a hammer or a soup ladle.  Without moving your elbow, slowly turn your palm and hand upward to a "thumbs-up" position.  Hold this position for __________ seconds. Slowly return to the starting position. Repeat __________ times. Complete this exercise __________ times per day.  STRENGTH  Forearm Pronators   Sit with your right / left forearm supported on a table, keeping your elbow below shoulder height. Rest your hand over the edge, palm up.  Gently grip a hammer or a soup ladle.  Without moving your elbow, slowly turn your palm and hand upward to a "thumbs-up" position.  Hold this position for __________ seconds. Slowly return to the starting position. Repeat __________ times. Complete this exercise __________ times per day.  STRENGTH - Grip  Grasp a tennis ball, a dense sponge, or a large, rolled sock in your hand.  Squeeze as hard as you can, without increasing any pain.  Hold this position for __________ seconds. Release your grip slowly. Repeat __________ times. Complete this exercise __________ times per day.  STRENGTH - Elbow Extensors, Isometric  Stand or sit upright, on a firm surface. Place your right / left arm so that your palm faces your stomach, and it is at the height of your waist.  Place your opposite hand on   the underside of your forearm. Gently push up as your right / left arm resists. Push as hard as you can with both arms, without causing any pain or movement at your right / left elbow. Hold this stationary position for __________ seconds. Gradually release the tension in both arms. Allow your muscles to relax completely before repeating. Document Released: 04/15/2005 Document Revised: 07/08/2011 Document Reviewed: 07/28/2008 [employer] Patient Information 2014 [employer].  

## 2013-08-24 NOTE — ED Notes (Signed)
Pain rt elbow for 3-4 weeks,  No known injury.  Increased pain with movement

## 2013-09-30 ENCOUNTER — Emergency Department (HOSPITAL_COMMUNITY): Payer: Medicare Other

## 2013-09-30 ENCOUNTER — Emergency Department (HOSPITAL_COMMUNITY)
Admission: EM | Admit: 2013-09-30 | Discharge: 2013-09-30 | Disposition: A | Discharge status: Home / Self Care | Payer: Medicare Other | Attending: Emergency Medicine | Admitting: Emergency Medicine

## 2013-09-30 ENCOUNTER — Encounter (HOSPITAL_COMMUNITY): Payer: Self-pay | Admitting: Emergency Medicine

## 2013-09-30 DIAGNOSIS — R079 Chest pain, unspecified: Secondary | ICD-10-CM

## 2013-09-30 DIAGNOSIS — I209 Angina pectoris, unspecified: Secondary | ICD-10-CM | POA: Insufficient documentation

## 2013-09-30 DIAGNOSIS — Z880 Allergy status to penicillin: Secondary | ICD-10-CM | POA: Insufficient documentation

## 2013-09-30 DIAGNOSIS — G40909 Epilepsy, unspecified, not intractable, without status epilepticus: Secondary | ICD-10-CM | POA: Diagnosis not present

## 2013-09-30 DIAGNOSIS — I2510 Atherosclerotic heart disease of native coronary artery without angina pectoris: Secondary | ICD-10-CM | POA: Diagnosis not present

## 2013-09-30 DIAGNOSIS — Z79899 Other long term (current) drug therapy: Secondary | ICD-10-CM | POA: Diagnosis not present

## 2013-09-30 DIAGNOSIS — E669 Obesity, unspecified: Secondary | ICD-10-CM | POA: Insufficient documentation

## 2013-09-30 DIAGNOSIS — J45909 Unspecified asthma, uncomplicated: Secondary | ICD-10-CM | POA: Insufficient documentation

## 2013-09-30 DIAGNOSIS — Z885 Allergy status to narcotic agent status: Secondary | ICD-10-CM | POA: Insufficient documentation

## 2013-09-30 DIAGNOSIS — Z888 Allergy status to other drugs, medicaments and biological substances status: Secondary | ICD-10-CM | POA: Diagnosis not present

## 2013-09-30 DIAGNOSIS — F17200 Nicotine dependence, unspecified, uncomplicated: Secondary | ICD-10-CM | POA: Insufficient documentation

## 2013-09-30 DIAGNOSIS — I10 Essential (primary) hypertension: Secondary | ICD-10-CM | POA: Diagnosis not present

## 2013-09-30 DIAGNOSIS — R0789 Other chest pain: Secondary | ICD-10-CM | POA: Diagnosis not present

## 2013-09-30 LAB — BASIC METABOLIC PANEL
BUN: 10 mg/dL (ref 6–23)
CO2: 25 mEq/L (ref 19–32)
Calcium: 9.6 mg/dL (ref 8.4–10.5)
Chloride: 101 mEq/L (ref 96–112)
Creatinine, Ser: 0.78 mg/dL (ref 0.50–1.35)
GFR calc Af Amer: >90 mL/min (ref >90)
GFR calc non Af Amer: >90 mL/min (ref >90)
Glucose, Bld: 93 mg/dL (ref 70–99)
Potassium: 3.7 mEq/L (ref 3.7–5.3)
Sodium: 138 mEq/L (ref 137–147)

## 2013-09-30 LAB — CBC WITH DIFFERENTIAL/PLATELET
Basophils Absolute: 0 10*3/uL (ref 0.0–0.1)
Basophils Relative: 0 % (ref 0–1)
Eosinophils Absolute: 0.3 10*3/uL (ref 0.0–0.7)
Eosinophils Relative: 3 % (ref 0–5)
HCT: 42.8 % (ref 39.0–52.0)
Hemoglobin: 14.5 g/dL (ref 13.0–17.0)
Lymphocytes Relative: 40 % (ref 12–46)
Lymphs Abs: 4.7 10*3/uL — ABNORMAL HIGH (ref 0.7–4.0)
MCH: 30 pg (ref 26.0–34.0)
MCHC: 33.9 g/dL (ref 30.0–36.0)
MCV: 88.4 fL (ref 78.0–100.0)
Monocytes Absolute: 0.7 10*3/uL (ref 0.1–1.0)
Monocytes Relative: 6 % (ref 3–12)
Neutro Abs: 5.9 10*3/uL (ref 1.7–7.7)
Neutrophils Relative %: 51 % (ref 43–77)
Platelets: 230 10*3/uL (ref 150–400)
RBC: 4.84 MIL/uL (ref 4.22–5.81)
RDW: 13 % (ref 11.5–15.5)
WBC: 11.6 10*3/uL — ABNORMAL HIGH (ref 4.0–10.5)

## 2013-09-30 LAB — TROPONIN I
Troponin I: <0.3 ng/mL (ref <0.30)
Troponin I: <0.3 ng/mL (ref <0.30)

## 2013-09-30 MED ORDER — ASPIRIN 81 MG PO CHEW
324.0000 mg | CHEWABLE_TABLET | Freq: Once | ORAL | Status: AC
  Administered 2013-09-30: 324 mg via ORAL
  Filled 2013-09-30: qty 4

## 2013-09-30 MED ORDER — NITROGLYCERIN 0.4 MG SL SUBL
0.4000 mg | SUBLINGUAL_TABLET | Freq: Once | SUBLINGUAL | Status: AC
  Administered 2013-09-30: 0.4 mg via SUBLINGUAL

## 2013-09-30 MED ORDER — OXYCODONE-ACETAMINOPHEN 5-325 MG PO TABS
2.0000 | ORAL_TABLET | Freq: Once | ORAL | Status: AC
  Administered 2013-09-30: 2 via ORAL
  Filled 2013-09-30: qty 2

## 2013-09-30 NOTE — Discharge Instructions (Signed)
Aspirin and Your Heart Aspirin affects the way your blood clots and helps "thin" the blood. Aspirin has many uses in heart disease. It may be used as a primary prevention to help reduce the risk of heart related events. It also can be used as a secondary measure to prevent more heart attacks or to prevent additional damage from blood clots.  ASPIRIN MAY HELP IF YOU:  Have had a heart attack or chest pain.  Have undergone open heart surgery such as CABG (Coronary Artery Bypass Surgery).  Have had coronary angioplasty with or without stents.  Have experienced a stroke or TIA (transient ischemic attack).  Have peripheral vascular disease (PAD).  Have chronic heart rhythm problems such as atrial fibrillation.  Are at risk for heart disease. BEFORE STARTING ASPIRIN Before you start taking aspirin, your caregiver will need to review your medical history. Many things will need to be taken into consideration, such as:  Smoking status.  Blood pressure.  Diabetes.  Gender.  Weight.  Cholesterol level. ASPIRIN DOSES  Aspirin should only be taken on the advice of your caregiver. Talk to your caregiver about how much aspirin you should take. Aspirin comes in different doses such as:  81 mg.  162 mg.  325 mg.  The aspirin dose you take may be affected by many factors, some of which include:  Your current medications, especially if your are taking blood-thinners or anti-platelet medicine.  Liver function.  Heart disease risk.  Age.  Aspirin comes in two forms:  Non-enteric-coated. This type of aspirin does not have a coating and is absorbed faster. Non-enteric coated aspirin is recommended for patients experiencing chest pain symptoms. This type of aspirin also comes in a chewable form.  Enteric-coated. This means the aspirin has a special coating that releases the medicine very slowly. Enteric-coated aspirin causes less stomach upset. This type of aspirin should not be chewed  or crushed. ASPIRIN SIDE EFFECTS Daily use of aspirin can increase your risk of serious side effects. Some of these include:  Increased bleeding. This can range from a cut that does not stop bleeding to more serious problems such as stomach bleeding or bleeding into the brain (Intracerebral bleeding).  Increased bruising.  Stomach upset.  An allergic reaction such as red, itchy skin.  Increased risk of bleeding when combined with non-steroidal anti-inflammatory medicine (NSAIDS).  Alcohol should be drank in moderation when taking aspirin. Alcohol can increase the risk of stomach bleeding when taken with aspirin.  Aspirin should not be given to children less than 29 years of age due to the association of Reye syndrome. Reye syndrome is a serious illness that can affect the brain and liver. Studies have linked Reye syndrome with aspirin use in children.  People that have nasal polyps have an increased risk of developing an aspirin allergy. SEEK MEDICAL CARE IF:   You develop an allergic reaction such as:  Hives.  Itchy skin.  Swelling of the lips, tongue or face.  You develop stomach pain.  You have unusual bleeding or bruising.  You have ringing in your ears. SEEK IMMEDIATE MEDICAL CARE IF:   You have severe chest pain, especially if the pain is crushing or pressure-like and spreads to the arms, back, neck, or jaw. THIS IS AN EMERGENCY. Do not wait to see if the pain will go away. Get medical help at once. Call your local emergency services (911 in the [place] DO NOT drive yourself to the hospital.  You have stroke-like symptoms  such as:  Loss of vision.  Difficulty talking.  Numbness or weakness on one side of your body.  Numbness or weakness in your arm or leg.  Not thinking clearly or feeling confused.  Your bowel movements are bloody, dark red or black in color.  You vomit or cough up blood.  You have blood in your urine.  You have shortness of breath,  coughing or wheezing. MAKE SURE YOU:   Understand these instructions.  Will monitor your condition.  Seek immediate medical care if necessary. Document Released: 03/28/2008 Document Revised: 08/10/2012 Document Reviewed: 03/28/2008 [facility] Patient Information 2014 [place].  Chest Pain (Nonspecific) It is often hard to give a specific diagnosis for the cause of chest pain. There is always a chance that your pain could be related to something serious, such as a heart attack or a blood clot in the lungs. You need to follow up with your caregiver for further evaluation. CAUSES   Heartburn.  Pneumonia or bronchitis.  Anxiety or stress.  Inflammation around your heart (pericarditis) or lung (pleuritis or pleurisy).  A blood clot in the lung.  A collapsed lung (pneumothorax). It can develop suddenly on its own (spontaneous pneumothorax) or from injury (trauma) to the chest.  Shingles infection (herpes zoster virus). The chest wall is composed of bones, muscles, and cartilage. Any of these can be the source of the pain.  The bones can be bruised by injury.  The muscles or cartilage can be strained by coughing or overwork.  The cartilage can be affected by inflammation and become sore (costochondritis). DIAGNOSIS  Lab tests or other studies, such as X-rays, electrocardiography, stress testing, or cardiac imaging, may be needed to find the cause of your pain.  TREATMENT   Treatment depends on what may be causing your chest pain. Treatment may include:  Acid blockers for heartburn.  Anti-inflammatory medicine.  Pain medicine for inflammatory conditions.  Antibiotics if an infection is present.  You may be advised to change lifestyle habits. This includes stopping smoking and avoiding alcohol, caffeine, and chocolate.  You may be advised to keep your head raised (elevated) when sleeping. This reduces the chance of acid going backward from your stomach into your  esophagus.  Most of the time, nonspecific chest pain will improve within 2 to 3 days with rest and mild pain medicine. HOME CARE INSTRUCTIONS   If antibiotics were prescribed, take your antibiotics as directed. Finish them even if you start to feel better.  For the next few days, avoid physical activities that bring on chest pain. Continue physical activities as directed.  Do not smoke.  Avoid drinking alcohol.  Only take over-the-counter or prescription medicine for pain, discomfort, or fever as directed by your caregiver.  Follow your caregiver's suggestions for further testing if your chest pain does not go away.  Keep any follow-up appointments you made. If you do not go to an appointment, you could develop lasting (chronic) problems with pain. If there is any problem keeping an appointment, you must call to reschedule. SEEK MEDICAL CARE IF:   You think you are having problems from the medicine you are taking. Read your medicine instructions carefully.  Your chest pain does not go away, even after treatment.  You develop a rash with blisters on your chest. SEEK IMMEDIATE MEDICAL CARE IF:   You have increased chest pain or pain that spreads to your arm, neck, jaw, back, or abdomen.  You develop shortness of breath, an increasing cough, or  you are coughing up blood.  You have severe back or abdominal pain, feel nauseous, or vomit.  You develop severe weakness, fainting, or chills.  You have a fever. THIS IS AN EMERGENCY. Do not wait to see if the pain will go away. Get medical help at once. Call your local emergency services (911 in [place] Do not drive yourself to the hospital. MAKE SURE YOU:   Understand these instructions.  Will watch your condition.  Will get help right away if you are not doing well or get worse. Document Released: 01/23/2005 Document Revised: 07/08/2011 Document Reviewed: 11/19/2007 [facility] Patient Information 2014 [place].

## 2013-09-30 NOTE — ED Notes (Signed)
Pt c/o pain in center of chest that goes through to his back, states he also has some feeling of pressure on his chest at time.

## 2013-10-09 NOTE — ED Provider Notes (Signed)
CSN: [id number]     Arrival date & time 09/30/13  0011 History   First MD Initiated Contact with Patient 09/30/13 0026     No chief complaint on file.    (Consider location/radiation/quality/duration/timing/severity/associated sxs/prior Treatment) HPI  32yM with CP. Tight in center of chest. Radiates into back. Onset earlier today. Cannot remember what was doing at onset. Has been fairly constant. No appreciable exacerbating or relieving factors. No fever or chills. No cough. No n/v. No unusual leg or swelling.  Past Medical History  Diagnosis Date  . Seizure disorder   . Asthma   . Hypertension   . Acute angina   . CAD (coronary artery disease)   . Seizures    Past Surgical History  Procedure Laterality Date  . Cholecystectomy    . Eye surgery    . Facial reconstruction surgery     Family History  Problem Relation Age of Onset  . Diabetes Mother   . Hypertension Mother   . Heart failure Father   . Stroke Father   . Diabetes Father   . Heart failure Brother    History  Substance Use Topics  . Smoking status: Current Every Day Smoker -- 0.50 packs/day for 20 years    Types: Cigarettes  . Smokeless tobacco: Never Used  . Alcohol Use: No     Comment: occasionally    Review of Systems  All systems reviewed and negative, other than as noted in HPI.   Allergies  Morphine and related; Penicillins; Tramadol; and Ibuprofen  Home Medications   Prior to Admission medications   Medication Sig Start Date End Date Taking? Authorizing Provider  albuterol (PROVENTIL) (2.5 MG/3ML) 0.083% nebulizer solution Take 3 mLs (2.5 mg total) by nebulization every 6 (six) hours as needed for wheezing. 09/27/12   Lennox, NP  benazepril-hydrochlorthiazide (LOTENSIN HCT) 20-25 MG per tablet Take 1 tablet by mouth daily.    Historical Provider, MD  ciprofloxacin (CIPRO) 500 MG tablet Take 1 tablet (500 mg total) by mouth 2 (two) times daily. 04/19/13   Evalee Jefferson, PA-C   ciprofloxacin-dexamethasone (CIPRODEX) otic suspension Place 4 drops into the left ear 2 (two) times daily. 05/23/13   Johnna Acosta, MD  divalproex (DEPAKOTE ER) 500 MG 24 hr tablet Take 500 mg by mouth daily.    Historical Provider, MD  HYDROcodone-acetaminophen (NORCO) 5-325 MG per tablet Take 1-2 tablets by mouth every 6 (six) hours as needed (for pain). 06/16/13   Karen Chafe Molpus, MD  HYDROcodone-acetaminophen (NORCO/VICODIN) 5-325 MG per tablet Take 1 tablet by mouth every 4 (four) hours as needed for moderate pain. 04/19/13   Evalee Jefferson, PA-C  HYDROcodone-acetaminophen (NORCO/VICODIN) 5-325 MG per tablet Take 1 tablet by mouth every 4 (four) hours as needed for moderate pain. 06/22/13   Evalee Jefferson, PA-C  nitroGLYCERIN (NITROSTAT) 0.4 MG SL tablet Place 0.4 mg under the tongue every 5 (five) minutes as needed for chest pain.     Historical Provider, MD  phenytoin (DILANTIN) 100 MG ER capsule Take 300 mg by mouth every morning.      Historical Provider, MD  predniSONE (DELTASONE) 10 MG tablet 6, 5, 4, 3, 2 then 1 tablet by mouth daily for 6 days total. 06/22/13   Evalee Jefferson, PA-C  predniSONE (DELTASONE) 10 MG tablet 6, 5, 4, 3, 2 then 1 tablet by mouth daily for 6 days total. 08/24/13   Evalee Jefferson, PA-C   BP 98/54  Pulse 58  Temp(Src) 98.2 F (  36.8 C) (Oral)  Resp 21  Ht 6' (1.829 m)  Wt 289 lb (131.09 kg)  BMI 39.19 kg/m2  SpO2 97% Physical Exam  Nursing note and vitals reviewed. Constitutional: He appears well-developed and well-nourished. No distress.  Laying in bed. NAD. Obese.   HENT:  Head: Normocephalic and atraumatic.  Eyes: Conjunctivae are normal. Right eye exhibits no discharge. Left eye exhibits no discharge.  Neck: Neck supple.  Cardiovascular: Normal rate, regular rhythm and normal heart sounds.  Exam reveals no gallop and no friction rub.   No murmur heard. Pulmonary/Chest: Effort normal and breath sounds normal. No respiratory distress. He exhibits no tenderness.  No  chest tenderness. Reports pain actually better with ROM of shoulders.   Abdominal: Soft. He exhibits no distension. There is no tenderness.  Musculoskeletal: He exhibits no edema and no tenderness.  Lower extremities symmetric as compared to each other. No calf tenderness. Negative Homan's. No palpable cords.   Neurological: He is alert.  Skin: Skin is warm and dry.  Psychiatric: He has a normal mood and affect. His behavior is normal. Thought content normal.    ED Course  Procedures (including critical care time) Labs Review Labs Reviewed  CBC WITH DIFFERENTIAL - Abnormal; Notable for the following:    WBC 11.6 (*)    Lymphs Abs 4.7 (*)    All other components within normal limits  BASIC METABOLIC PANEL  TROPONIN I  TROPONIN I   Imaging Review No results found.  Dg Chest 2 View  09/30/2013   CLINICAL DATA:  Chest pain.  EXAM: CHEST  2 VIEW  COMPARISON:  02/07/2012.  FINDINGS: Normal heart size and mediastinal contours. Mild pulmonary hyperinflation, correlating with history of asthma. No acute infiltrate or edema. No effusion or pneumothorax. No acute osseous findings.  IMPRESSION: No active cardiopulmonary disease.   Electronically Signed   By: Jorje Guild M.D.   On: 09/30/2013 01:19    EKG Interpretation   Date/Time:  Thursday September 30 2013 00:27:29 EDT Ventricular Rate:  67 PR Interval:  143 QRS Duration: 90 QT Interval:  363 QTC Calculation: 383 R Axis:   68 Text Interpretation:  Sinus arrhythmia Non-specific ST-t changes No  significant change was found as compared to 12/2012 Confirmed by Kinsley Holderman  MD,  Arnold ([id number]) on 09/30/2013 1:31:57 AM      MDM   Final diagnoses:  Chest pain    32yM with CP. Atypical for ACS. Doubt infectious, PE or dissection. W/u fairly unremarkable.     Virgel Manifold, MD 10/09/13 [phone]

## 2013-10-19 ENCOUNTER — Emergency Department (HOSPITAL_COMMUNITY)
Admission: EM | Admit: 2013-10-19 | Discharge: 2013-10-19 | Disposition: A | Discharge status: Home / Self Care | Payer: Medicare Other | Attending: Emergency Medicine | Admitting: Emergency Medicine

## 2013-10-19 ENCOUNTER — Emergency Department (HOSPITAL_COMMUNITY): Payer: Medicare Other

## 2013-10-19 ENCOUNTER — Encounter (HOSPITAL_COMMUNITY): Payer: Self-pay | Admitting: Emergency Medicine

## 2013-10-19 DIAGNOSIS — G40909 Epilepsy, unspecified, not intractable, without status epilepticus: Secondary | ICD-10-CM | POA: Diagnosis not present

## 2013-10-19 DIAGNOSIS — I10 Essential (primary) hypertension: Secondary | ICD-10-CM | POA: Diagnosis not present

## 2013-10-19 DIAGNOSIS — Z79899 Other long term (current) drug therapy: Secondary | ICD-10-CM | POA: Insufficient documentation

## 2013-10-19 DIAGNOSIS — R069 Unspecified abnormalities of breathing: Secondary | ICD-10-CM | POA: Diagnosis not present

## 2013-10-19 DIAGNOSIS — F17200 Nicotine dependence, unspecified, uncomplicated: Secondary | ICD-10-CM | POA: Diagnosis not present

## 2013-10-19 DIAGNOSIS — J45901 Unspecified asthma with (acute) exacerbation: Secondary | ICD-10-CM | POA: Diagnosis not present

## 2013-10-19 DIAGNOSIS — R071 Chest pain on breathing: Secondary | ICD-10-CM | POA: Insufficient documentation

## 2013-10-19 DIAGNOSIS — Z880 Allergy status to penicillin: Secondary | ICD-10-CM | POA: Insufficient documentation

## 2013-10-19 DIAGNOSIS — R6889 Other general symptoms and signs: Secondary | ICD-10-CM | POA: Diagnosis not present

## 2013-10-19 DIAGNOSIS — R079 Chest pain, unspecified: Secondary | ICD-10-CM | POA: Diagnosis present

## 2013-10-19 DIAGNOSIS — R0789 Other chest pain: Secondary | ICD-10-CM

## 2013-10-19 DIAGNOSIS — Z792 Long term (current) use of antibiotics: Secondary | ICD-10-CM | POA: Insufficient documentation

## 2013-10-19 DIAGNOSIS — R0602 Shortness of breath: Secondary | ICD-10-CM | POA: Diagnosis not present

## 2013-10-19 DIAGNOSIS — I2510 Atherosclerotic heart disease of native coronary artery without angina pectoris: Secondary | ICD-10-CM | POA: Insufficient documentation

## 2013-10-19 LAB — CBC WITH DIFFERENTIAL/PLATELET
BASOS PCT: 0 % (ref 0–1)
Basophils Absolute: 0 10*3/uL (ref 0.0–0.1)
EOS ABS: 0.3 10*3/uL (ref 0.0–0.7)
Eosinophils Relative: 3 % (ref 0–5)
HCT: 44.6 % (ref 39.0–52.0)
Hemoglobin: 15.6 g/dL (ref 13.0–17.0)
Lymphocytes Relative: 40 % (ref 12–46)
Lymphs Abs: 4.9 10*3/uL — ABNORMAL HIGH (ref 0.7–4.0)
MCH: 31 pg (ref 26.0–34.0)
MCHC: 35 g/dL (ref 30.0–36.0)
MCV: 88.5 fL (ref 78.0–100.0)
MONOS PCT: 5 % (ref 3–12)
Monocytes Absolute: 0.6 10*3/uL (ref 0.1–1.0)
Neutro Abs: 6.4 10*3/uL (ref 1.7–7.7)
Neutrophils Relative %: 52 % (ref 43–77)
PLATELETS: 268 10*3/uL (ref 150–400)
RBC: 5.04 MIL/uL (ref 4.22–5.81)
RDW: 12.9 % (ref 11.5–15.5)
WBC: 12.3 10*3/uL — ABNORMAL HIGH (ref 4.0–10.5)

## 2013-10-19 LAB — COMPREHENSIVE METABOLIC PANEL
ALT: 31 U/L (ref 0–53)
AST: 20 U/L (ref 0–37)
Albumin: 3.6 g/dL (ref 3.5–5.2)
Alkaline Phosphatase: 97 U/L (ref 39–117)
BUN: 10 mg/dL (ref 6–23)
CALCIUM: 9.8 mg/dL (ref 8.4–10.5)
CO2: 26 mEq/L (ref 19–32)
Chloride: 101 mEq/L (ref 96–112)
Creatinine, Ser: 0.87 mg/dL (ref 0.50–1.35)
GFR calc non Af Amer: >90 mL/min (ref >90)
Glucose, Bld: 101 mg/dL — ABNORMAL HIGH (ref 70–99)
POTASSIUM: 3.8 meq/L (ref 3.7–5.3)
Sodium: 141 mEq/L (ref 137–147)
TOTAL PROTEIN: 7.4 g/dL (ref 6.0–8.3)
Total Bilirubin: 0.2 mg/dL — ABNORMAL LOW (ref 0.3–1.2)

## 2013-10-19 LAB — TROPONIN I: Troponin I: <0.3 ng/mL (ref <0.30)

## 2013-10-19 LAB — PRO B NATRIURETIC PEPTIDE: PRO B NATRI PEPTIDE: 10.7 pg/mL (ref 0–125)

## 2013-10-19 MED ORDER — ALBUTEROL SULFATE (2.5 MG/3ML) 0.083% IN NEBU
5.0000 mg | INHALATION_SOLUTION | Freq: Once | RESPIRATORY_TRACT | Status: AC
  Administered 2013-10-19: 5 mg via RESPIRATORY_TRACT
  Filled 2013-10-19: qty 6

## 2013-10-19 MED ORDER — METHYLPREDNISOLONE SODIUM SUCC 125 MG IJ SOLR
125.0000 mg | Freq: Once | INTRAMUSCULAR | Status: AC
  Administered 2013-10-19: 125 mg via INTRAMUSCULAR
  Filled 2013-10-19: qty 2

## 2013-10-19 MED ORDER — PREDNISONE 50 MG PO TABS: 50.0000 mg | ORAL_TABLET | Freq: Every day | ORAL | Status: DC

## 2013-10-19 NOTE — ED Provider Notes (Signed)
CSN: [id number]     Arrival date & time 10/19/13  0015 History   First MD Initiated Contact with Patient 10/19/13 0028    This chart was scribed for Julianne Rice, MD by Terressa Koyanagi, ED Scribe. This patient was seen in room [facility] and the patient's care was started at 12:34 AM.  Chief Complaint  Patient presents with  . Shortness of Breath  . Chest Pain   The history is provided by the patient. No language interpreter was used.   HPI Comments: Chad Hampton is a 33 y.o. male, with a Hx of seizures, asthma, HTN, and CAD, who presents to the Emergency Department complaining of SOB onset 4pm yesterday. Pt reports he was laying down when he began experiencing SOB. Pt also complains of associated "uneasiness in [his] abd." Pt denies wheezing, cough, and swelling of the legs (pt notes, however, that his legs swell frequently at baseline).   Past Medical History  Diagnosis Date  . Seizure disorder   . Asthma   . Hypertension   . Acute angina   . CAD (coronary artery disease)   . Seizures    Past Surgical History  Procedure Laterality Date  . Cholecystectomy    . Eye surgery    . Facial reconstruction surgery     Family History  Problem Relation Age of Onset  . Diabetes Mother   . Hypertension Mother   . Heart failure Father   . Stroke Father   . Diabetes Father   . Heart failure Brother    History  Substance Use Topics  . Smoking status: Current Every Day Smoker -- 0.50 packs/day for 20 years    Types: Cigarettes  . Smokeless tobacco: Never Used  . Alcohol Use: No     Comment: occasionally    Review of Systems  Constitutional: Negative for fever and chills.  Respiratory: Positive for chest tightness and shortness of breath. Negative for choking and wheezing.   Cardiovascular: Negative for chest pain, palpitations and leg swelling.  Gastrointestinal: Negative for nausea, vomiting and abdominal pain.  Musculoskeletal: Negative for back pain, neck pain and neck  stiffness.  Skin: Negative for rash and wound.  Neurological: Negative for dizziness, weakness, light-headedness, numbness and headaches.  All other systems reviewed and are negative.     Allergies  Morphine and related; Penicillins; Tramadol; and Ibuprofen  Home Medications   Prior to Admission medications   Medication Sig Start Date End Date Taking? Authorizing Keryn Nessler  albuterol (PROVENTIL) (2.5 MG/3ML) 0.083% nebulizer solution Take 3 mLs (2.5 mg total) by nebulization every 6 (six) hours as needed for wheezing. 09/27/12   [place], NP  benazepril-hydrochlorthiazide (LOTENSIN HCT) 20-25 MG per tablet Take 1 tablet by mouth daily.    Historical Leanette Eutsler, MD  ciprofloxacin (CIPRO) 500 MG tablet Take 1 tablet (500 mg total) by mouth 2 (two) times daily. 04/19/13   Evalee Jefferson, PA-C  ciprofloxacin-dexamethasone (CIPRODEX) otic suspension Place 4 drops into the left ear 2 (two) times daily. 05/23/13   Johnna Acosta, MD  divalproex (DEPAKOTE ER) 500 MG 24 hr tablet Take 500 mg by mouth daily.    Historical Laurel Harnden, MD  HYDROcodone-acetaminophen (NORCO) 5-325 MG per tablet Take 1-2 tablets by mouth every 6 (six) hours as needed (for pain). 06/16/13   Karen Chafe Molpus, MD  HYDROcodone-acetaminophen (NORCO/VICODIN) 5-325 MG per tablet Take 1 tablet by mouth every 4 (four) hours as needed for moderate pain. 04/19/13   Evalee Jefferson, PA-C  HYDROcodone-acetaminophen (  NORCO/VICODIN) 5-325 MG per tablet Take 1 tablet by mouth every 4 (four) hours as needed for moderate pain. 06/22/13   Evalee Jefferson, PA-C  nitroGLYCERIN (NITROSTAT) 0.4 MG SL tablet Place 0.4 mg under the tongue every 5 (five) minutes as needed for chest pain.     Historical Bryer Gottsch, MD  phenytoin (DILANTIN) 100 MG ER capsule Take 300 mg by mouth every morning.      Historical Loza Prell, MD  predniSONE (DELTASONE) 10 MG tablet 6, 5, 4, 3, 2 then 1 tablet by mouth daily for 6 days total. 06/22/13   Evalee Jefferson, PA-C  predniSONE (DELTASONE) 10 MG  tablet 6, 5, 4, 3, 2 then 1 tablet by mouth daily for 6 days total. 08/24/13   Evalee Jefferson, PA-C   Triage Vitals: BP 135/70  Pulse 75  Temp(Src) 98.3 F (36.8 C) (Oral)  Resp 20  Ht 6' (1.829 m)  Wt 285 lb (129.275 kg)  BMI 38.64 kg/m2  SpO2 97% Physical Exam  Nursing note and vitals reviewed. Constitutional: He is oriented to person, place, and time. He appears well-developed and well-nourished. No distress.  HENT:  Head: Normocephalic and atraumatic.  Mouth/Throat: Oropharynx is clear and moist.  Eyes: EOM are normal. Pupils are equal, round, and reactive to light.  Neck: Normal range of motion. Neck supple.  Cardiovascular: Normal rate and regular rhythm.   Pulmonary/Chest: Effort normal. No respiratory distress. He has wheezes (scattered end expiratory wheezing.). He has no rales. He exhibits tenderness (right-sided chest parasternal chest tenderness to palpation. No crepitance or deformity.).  Abdominal: Soft. Bowel sounds are normal. He exhibits no distension and no mass. There is no tenderness. There is no rebound and no guarding.  Musculoskeletal: Normal range of motion. He exhibits no edema and no tenderness.  No calf swelling or tenderness.  Neurological: He is alert and oriented to person, place, and time.  Skin: Skin is warm and dry. No rash noted. No erythema.  Psychiatric: He has a normal mood and affect. His behavior is normal.    ED Course  Procedures (including critical care time) DIAGNOSTIC STUDIES: Oxygen Saturation is 97% on RA, normal by my interpretation.    COORDINATION OF CARE: 12:37 AM-Discussed treatment plan which includes EKG and breathing treatment with pt at bedside. Patient verbalizes understanding and agrees with treatment plan.   Labs Review Labs Reviewed  CBC WITH DIFFERENTIAL - Abnormal; Notable for the following:    WBC 12.3 (*)    Lymphs Abs 4.9 (*)    All other components within normal limits  COMPREHENSIVE METABOLIC PANEL - Abnormal;  Notable for the following:    Glucose, Bld 101 (*)    Total Bilirubin 0.2 (*)    All other components within normal limits  PRO B NATRIURETIC PEPTIDE  TROPONIN I    Imaging Review Dg Chest 2 View  10/19/2013   CLINICAL DATA:  Chest pain and shortness of breath since 1600 hr.  EXAM: CHEST  2 VIEW  COMPARISON:  09/30/2013  FINDINGS: The heart size and mediastinal contours are within normal limits. Both lungs are clear. The visualized skeletal structures are unremarkable.  IMPRESSION: No active cardiopulmonary disease.   Electronically Signed   By: Lucienne Capers M.D.   On: 10/19/2013 01:38     EKG Interpretation None      Date: 10/19/2013  Rate: 88  Rhythm: normal sinus rhythm  QRS Axis: normal  Intervals: normal  ST/T Wave abnormalities: normal  Conduction Disutrbances:none  Narrative Interpretation:  Old EKG Reviewed: unchanged   MDM   Final diagnoses:  None   I personally performed the services described in this documentation, which was scribed in my presence. The recorded information has been reviewed and is accurate.  Patient's chest tightness and shortness of breath is improved after nebulized treatment. He no longer has any wheezing. I suspect this was all related to a mild asthma exacerbation. There is no evidence of any type of ischemia on his EKG. He has a normal troponin. His chest pain was reproduced with palpation. I believe been adequately screened for coronary artery disease and think this is very low likelihood as being the cause of his symptoms. Patient is well-appearing watching television in his room. Have discussed at length with him. He's been given thorough discharge precautions and has voiced understanding.   Julianne Rice, MD 10/19/13 [phone]

## 2013-10-19 NOTE — ED Notes (Signed)
Pt given shirt and slipper socks to wear out of ed. Stated he lived close and was going to walk home.

## 2013-10-19 NOTE — ED Notes (Signed)
Patient also reports squeezing chest pain.

## 2013-10-19 NOTE — ED Notes (Signed)
Patient reports shortness of breath, states "I feel like something is choking me."

## 2013-10-19 NOTE — Discharge Instructions (Signed)
Asthma, Adult Asthma is a condition of the lungs in which the airways tighten and narrow. Asthma can make it hard to breathe. Asthma cannot be cured, but medicine and lifestyle changes can help control it. Asthma may be started (triggered) by:  Animal skin flakes (dander).  Dust.  Cockroaches.  Pollen.  Mold.  Smoke.  Cleaning products.  Hair sprays or aerosol sprays.  Paint fumes or strong smells.  Cold air, weather changes, and winds.  Crying or laughing hard.  Stress.  Certain medicines or drugs.  Foods, such as dried fruit, potato chips, and sparkling grape juice.  Infections or conditions (colds, flu).  Exercise.  Certain medical conditions or diseases.  Exercise or tiring activities. HOME CARE   Take medicine as told by your doctor.  Use a peak flow meter as told by your doctor. A peak flow meter is a tool that measures how well the lungs are working.  Record and keep track of the peak flow meter's readings.  Understand and use the asthma action plan. An asthma action plan is a written plan for taking care of your asthma and treating your attacks.  To help prevent asthma attacks:  Do not smoke. Stay away from secondhand smoke.  Change your heating and air conditioning filter often.  Limit your use of fireplaces and wood stoves.  Get rid of pests (such as roaches and mice) and their droppings.  Throw away plants if you see mold on them.  Clean your floors. Dust regularly. Use cleaning products that do not smell.  Have someone vacuum when you are not home. Use a vacuum cleaner with a HEPA filter if possible.  Replace carpet with wood, tile, or vinyl flooring. Carpet can trap animal skin flakes and dust.  Use allergy-proof pillows, mattress covers, and box spring covers.  Wash bed sheets and blankets every week in hot water and dry them in a dryer.  Use blankets that are made of polyester or cotton.  Clean bathrooms and kitchens with bleach.  If possible, have someone repaint the walls in these rooms with mold-resistant paint. Keep out of the rooms that are being cleaned and painted.  Wash hands often. GET HELP IF:  You have make a whistling sound when breaking (wheeze), have shortness of breath, or have a cough even if taking medicine to prevent attacks.  The colored mucus you cough up (sputum) is thicker than usual.  The colored mucus you cough up changes from clear or white to yellow, green, gray, or bloody.  You have problems from the medicine you are taking such as:  A rash.  Itching.  Swelling.  Trouble breathing.  You need reliever medicines more than 2-3 times a week.  Your peak flow measurement is still at 50-79% of your personal best after following the action plan for 1 hour. GET HELP RIGHT AWAY IF:   You seem to be worse and are not responding to medicine during an asthma attack.  You are short of breath even at rest.  You get short of breath when doing very little activity.  You have trouble eating, drinking, or talking.  You have chest pain.  You have a fast heartbeat.  Your lips or fingernails start to turn blue.  You are lightheaded, dizzy, or faint.  Your peak flow is less than 50% of your personal best.  You have a fever or lasting symptoms for more than 2-3 days.  You have a fever and your symptoms suddenly get worse. MAKE  SURE YOU:   Understand these instructions.  Will watch your condition.  Will get help right away if you are not doing well or get worse. Document Released: 10/02/2007 Document Revised: 02/03/2013 Document Reviewed: 11/12/2012 [facility] Patient Information 2015 [place], [place]. This information is not intended to replace advice given to you by your health care provider. Make sure you discuss any questions you have with your health care provider.  Chest Wall Pain Chest wall pain is pain felt in or around the chest bones and muscles. It may take up to 6 weeks to  get better. It may take longer if you are active. Chest wall pain can happen on its own. Other times, things like germs, injury, coughing, or exercise can cause the pain. HOME CARE   Avoid activities that make you tired or cause pain. Try not to use your chest, belly (abdominal), or side muscles. Do not use heavy weights.  Put ice on the sore area.  Put ice in a plastic bag.  Place a towel between your skin and the bag.  Leave the ice on for 15-20 minutes for the first 2 days.  Only take medicine as told by your doctor. GET HELP RIGHT AWAY IF:   You have more pain or are very uncomfortable.  You have a fever.  Your chest pain gets worse.  You have new problems.  You feel sick to your stomach (nauseous) or throw up (vomit).  You start to sweat or feel lightheaded.  You have a cough with mucus (phlegm).  You cough up blood. MAKE SURE YOU:   Understand these instructions.  Will watch your condition.  Will get help right away if you are not doing well or get worse. Document Released: 10/02/2007 Document Revised: 07/08/2011 Document Reviewed: 12/10/2010 [facility] Patient Information 2015 [place], [place]. This information is not intended to replace advice given to you by your health care provider. Make sure you discuss any questions you have with your health care provider.

## 2013-11-02 ENCOUNTER — Emergency Department (HOSPITAL_COMMUNITY)
Admission: EM | Admit: 2013-11-02 | Discharge: 2013-11-02 | Disposition: A | Discharge status: Home / Self Care | Payer: Medicare Other | Attending: Emergency Medicine | Admitting: Emergency Medicine

## 2013-11-02 ENCOUNTER — Encounter (HOSPITAL_COMMUNITY): Payer: Self-pay | Admitting: Emergency Medicine

## 2013-11-02 DIAGNOSIS — H6121 Impacted cerumen, right ear: Secondary | ICD-10-CM

## 2013-11-02 DIAGNOSIS — I209 Angina pectoris, unspecified: Secondary | ICD-10-CM | POA: Insufficient documentation

## 2013-11-02 DIAGNOSIS — J45909 Unspecified asthma, uncomplicated: Secondary | ICD-10-CM | POA: Diagnosis not present

## 2013-11-02 DIAGNOSIS — Z888 Allergy status to other drugs, medicaments and biological substances status: Secondary | ICD-10-CM | POA: Insufficient documentation

## 2013-11-02 DIAGNOSIS — I2510 Atherosclerotic heart disease of native coronary artery without angina pectoris: Secondary | ICD-10-CM | POA: Diagnosis not present

## 2013-11-02 DIAGNOSIS — G40909 Epilepsy, unspecified, not intractable, without status epilepticus: Secondary | ICD-10-CM | POA: Insufficient documentation

## 2013-11-02 DIAGNOSIS — H6120 Impacted cerumen, unspecified ear: Secondary | ICD-10-CM | POA: Insufficient documentation

## 2013-11-02 DIAGNOSIS — I10 Essential (primary) hypertension: Secondary | ICD-10-CM | POA: Insufficient documentation

## 2013-11-02 DIAGNOSIS — Z885 Allergy status to narcotic agent status: Secondary | ICD-10-CM | POA: Insufficient documentation

## 2013-11-02 DIAGNOSIS — Z880 Allergy status to penicillin: Secondary | ICD-10-CM | POA: Insufficient documentation

## 2013-11-02 DIAGNOSIS — Z79899 Other long term (current) drug therapy: Secondary | ICD-10-CM | POA: Insufficient documentation

## 2013-11-02 DIAGNOSIS — H9209 Otalgia, unspecified ear: Secondary | ICD-10-CM | POA: Diagnosis present

## 2013-11-02 DIAGNOSIS — F17200 Nicotine dependence, unspecified, uncomplicated: Secondary | ICD-10-CM | POA: Diagnosis not present

## 2013-11-02 MED ORDER — CARBAMIDE PEROXIDE 6.5 % OT SOLN: OTIC | Status: DC

## 2013-11-02 NOTE — ED Provider Notes (Signed)
CSN: [id number]     Arrival date & time 11/02/13  0116 History   First MD Initiated Contact with Patient 11/02/13 0127     Chief Complaint  Patient presents with  . Otalgia     HPI  Patient complains of right ear pain for the last few days. States he can't hear for the last 2 months. No fevers no chills no injuries. Additional symptoms. No sore throat or left-sided symptoms.  Past Medical History  Diagnosis Date  . Seizure disorder   . Asthma   . Hypertension   . Acute angina   . CAD (coronary artery disease)   . Seizures    Past Surgical History  Procedure Laterality Date  . Cholecystectomy    . Eye surgery    . Facial reconstruction surgery     Family History  Problem Relation Age of Onset  . Diabetes Mother   . Hypertension Mother   . Heart failure Father   . Stroke Father   . Diabetes Father   . Heart failure Brother    History  Substance Use Topics  . Smoking status: Current Every Day Smoker -- 0.50 packs/day for 20 years    Types: Cigarettes  . Smokeless tobacco: Never Used  . Alcohol Use: Yes     Comment: occasionally    Review of Systems  Constitutional: Negative for fever, chills, diaphoresis, appetite change and fatigue.  HENT: Positive for ear pain. Negative for mouth sores, sore throat and trouble swallowing.   Eyes: Negative for visual disturbance.  Respiratory: Negative for cough, chest tightness, shortness of breath and wheezing.   Cardiovascular: Negative for chest pain.  Gastrointestinal: Negative for nausea, vomiting, abdominal pain, diarrhea and abdominal distention.  Endocrine: Negative for polydipsia, polyphagia and polyuria.  Genitourinary: Negative for dysuria, frequency and hematuria.  Musculoskeletal: Negative for gait problem.  Skin: Negative for color change, pallor and rash.  Neurological: Negative for dizziness, syncope, light-headedness and headaches.  Hematological: Does not bruise/bleed easily.  Psychiatric/Behavioral: Negative  for behavioral problems and confusion.      Allergies  Morphine and related; Penicillins; Tramadol; and Ibuprofen  Home Medications   Prior to Admission medications   Medication Sig Start Date End Date Taking? Authorizing Provider  albuterol (PROVENTIL) (2.5 MG/3ML) 0.083% nebulizer solution Take 3 mLs (2.5 mg total) by nebulization every 6 (six) hours as needed for wheezing. 09/27/12  Yes Chester, NP  benazepril-hydrochlorthiazide (LOTENSIN HCT) 20-25 MG per tablet Take 1 tablet by mouth daily.   Yes Historical Provider, MD  divalproex (DEPAKOTE ER) 500 MG 24 hr tablet Take 500 mg by mouth daily.   Yes Historical Provider, MD  nitroGLYCERIN (NITROSTAT) 0.4 MG SL tablet Place 0.4 mg under the tongue every 5 (five) minutes as needed for chest pain.    Yes Historical Provider, MD  phenytoin (DILANTIN) 100 MG ER capsule Take 300 mg by mouth every morning.     Yes Historical Provider, MD  carbamide peroxide (DEBROX) 6.5 % otic solution 5 drops into right ear for 15 minutes then flush with warm water. 3 times per day 11/02/13   Tanna Furry, MD  ciprofloxacin (CIPRO) 500 MG tablet Take 1 tablet (500 mg total) by mouth 2 (two) times daily. 04/19/13   Evalee Jefferson, PA-C  ciprofloxacin-dexamethasone (CIPRODEX) otic suspension Place 4 drops into the left ear 2 (two) times daily. 05/23/13   Johnna Acosta, MD  HYDROcodone-acetaminophen (NORCO) 5-325 MG per tablet Take 1-2 tablets by mouth every 6 (six)  hours as needed (for pain). 06/16/13   Karen Chafe Molpus, MD  HYDROcodone-acetaminophen (NORCO/VICODIN) 5-325 MG per tablet Take 1 tablet by mouth every 4 (four) hours as needed for moderate pain. 04/19/13   Evalee Jefferson, PA-C  HYDROcodone-acetaminophen (NORCO/VICODIN) 5-325 MG per tablet Take 1 tablet by mouth every 4 (four) hours as needed for moderate pain. 06/22/13   Evalee Jefferson, PA-C  predniSONE (DELTASONE) 10 MG tablet 6, 5, 4, 3, 2 then 1 tablet by mouth daily for 6 days total. 06/22/13   Evalee Jefferson, PA-C    predniSONE (DELTASONE) 10 MG tablet 6, 5, 4, 3, 2 then 1 tablet by mouth daily for 6 days total. 08/24/13   Evalee Jefferson, PA-C  predniSONE (DELTASONE) 50 MG tablet Take 1 tablet (50 mg total) by mouth daily. 10/19/13   Julianne Rice, MD   BP 120/85  Pulse 88  Temp(Src) 97.9 F (36.6 C) (Oral)  Resp 17  Ht 6' (1.829 m)  Wt 285 lb (129.275 kg)  BMI 38.64 kg/m2  SpO2 97% Physical Exam  HENT:  Ears:    ED Course  Procedures (including critical care time) Labs Review Labs Reviewed - No data to display  Imaging Review No results found.   EKG Interpretation None      MDM   Final diagnoses:  Cerumen impaction, right    Prescription for drops. Warm water rinses.    Tanna Furry, MD 11/02/13 [phone]

## 2013-11-02 NOTE — Discharge Instructions (Signed)
Place the drops in your ear, leave in for 15 minutes, then rinse with warm water 3 times per day.  Cerumen Impaction A cerumen impaction is when the wax in your ear forms a plug. This plug usually causes reduced hearing. Sometimes it also causes an earache or dizziness. Removing a cerumen impaction can be difficult and painful. The wax sticks to the ear canal. The canal is sensitive and bleeds easily. If you try to remove a heavy wax buildup with a cotton tipped swab, you may push it in further. Irrigation with water, suction, and small ear curettes may be used to clear out the wax. If the impaction is fixed to the skin in the ear canal, ear drops may be needed for a few days to loosen the wax. People who build up a lot of wax frequently can use ear wax removal products available in your local drugstore. SEEK MEDICAL CARE IF:  You develop an earache, increased hearing loss, or marked dizziness. Document Released: 05/23/2004 Document Revised: 07/08/2011 Document Reviewed: 07/13/2009 [facility] Patient Information 2015 [place], [place]. This information is not intended to replace advice given to you by your health care provider. Make sure you discuss any questions you have with your health care provider.

## 2013-11-02 NOTE — ED Notes (Signed)
Pt c/o rt ear pain x 2 days, but states he can't hear anything out of that ear x 2 months.

## 2013-11-25 ENCOUNTER — Encounter (HOSPITAL_COMMUNITY): Payer: Self-pay | Admitting: Emergency Medicine

## 2013-11-25 ENCOUNTER — Emergency Department (HOSPITAL_COMMUNITY)
Admission: EM | Admit: 2013-11-25 | Discharge: 2013-11-25 | Disposition: A | Discharge status: Home / Self Care | Payer: Medicare Other | Attending: Emergency Medicine | Admitting: Emergency Medicine

## 2013-11-25 DIAGNOSIS — Z79899 Other long term (current) drug therapy: Secondary | ICD-10-CM | POA: Insufficient documentation

## 2013-11-25 DIAGNOSIS — Z880 Allergy status to penicillin: Secondary | ICD-10-CM | POA: Diagnosis not present

## 2013-11-25 DIAGNOSIS — J45909 Unspecified asthma, uncomplicated: Secondary | ICD-10-CM | POA: Diagnosis not present

## 2013-11-25 DIAGNOSIS — F17200 Nicotine dependence, unspecified, uncomplicated: Secondary | ICD-10-CM | POA: Diagnosis not present

## 2013-11-25 DIAGNOSIS — I10 Essential (primary) hypertension: Secondary | ICD-10-CM | POA: Diagnosis not present

## 2013-11-25 DIAGNOSIS — IMO0002 Reserved for concepts with insufficient information to code with codable children: Secondary | ICD-10-CM | POA: Diagnosis not present

## 2013-11-25 DIAGNOSIS — H9209 Otalgia, unspecified ear: Secondary | ICD-10-CM | POA: Insufficient documentation

## 2013-11-25 DIAGNOSIS — H60399 Other infective otitis externa, unspecified ear: Secondary | ICD-10-CM | POA: Insufficient documentation

## 2013-11-25 DIAGNOSIS — I2510 Atherosclerotic heart disease of native coronary artery without angina pectoris: Secondary | ICD-10-CM | POA: Insufficient documentation

## 2013-11-25 DIAGNOSIS — G40909 Epilepsy, unspecified, not intractable, without status epilepticus: Secondary | ICD-10-CM | POA: Diagnosis not present

## 2013-11-25 DIAGNOSIS — Z792 Long term (current) use of antibiotics: Secondary | ICD-10-CM | POA: Diagnosis not present

## 2013-11-25 DIAGNOSIS — H6092 Unspecified otitis externa, left ear: Secondary | ICD-10-CM

## 2013-11-25 MED ORDER — CIPROFLOXACIN HCL 500 MG PO TABS: 500.0000 mg | ORAL_TABLET | Freq: Two times a day (BID) | ORAL | Status: DC

## 2013-11-25 MED ORDER — ACETAMINOPHEN-CODEINE #3 300-30 MG PO TABS: 1.0000 | ORAL_TABLET | Freq: Four times a day (QID) | ORAL | Status: DC | PRN

## 2013-11-25 MED ORDER — NEOMYCIN-POLYMYXIN-HC 1 % OT SOLN
3.0000 [drp] | Freq: Four times a day (QID) | OTIC | Status: DC
  Administered 2013-11-25: 3 [drp] via OTIC

## 2013-11-25 MED ORDER — CIPROFLOXACIN HCL 250 MG PO TABS
500.0000 mg | ORAL_TABLET | Freq: Once | ORAL | Status: AC
  Administered 2013-11-25: 500 mg via ORAL
  Filled 2013-11-25: qty 2

## 2013-11-25 MED ORDER — NEOMYCIN-POLYMYXIN-HC 3.5-10000-1 OT SUSP
OTIC | Status: AC
  Filled 2013-11-25: qty 10

## 2013-11-25 NOTE — ED Notes (Signed)
Having pain to left ear.  Had drain from left ear yesterday (no sure of color).  Rates pain about a 6. Have not taken any medication for pain.

## 2013-11-25 NOTE — ED Provider Notes (Signed)
CSN: [id number]     Arrival date & time 11/25/13  6433 History   First MD Initiated Contact with Patient 11/25/13 [phone]     Chief Complaint  Patient presents with  . Otalgia     (Consider location/radiation/quality/duration/timing/severity/associated sxs/prior Treatment) HPI Comments: Patient is a 33 year old male who presents to the emergency department with complaint of left ear pain. The patient states he has had problems with his years for quite some time. He was seen by ear nose and throat approximately a year ago and told that he had recurrent infections, but they did not give him a source. He has not discussed his current issue with his primary physician. He states that on yesterday he noticed some drainage, but is unsure what color it may have been. He denies any high fever. He says he has some mild soreness but no frank pain of the area behind the ear, and he has not seen any increased redness or swelling in this area. He has not had any trauma or injury to the right or left year.  Patient is a 33 y.o. male presenting with ear pain. The history is provided by the patient.  Otalgia Location:  Left Associated symptoms: no cough and no neck pain     Past Medical History  Diagnosis Date  . Seizure disorder   . Asthma   . Hypertension   . Acute angina   . CAD (coronary artery disease)   . Seizures    Past Surgical History  Procedure Laterality Date  . Cholecystectomy    . Eye surgery    . Facial reconstruction surgery     Family History  Problem Relation Age of Onset  . Diabetes Mother   . Hypertension Mother   . Heart failure Father   . Stroke Father   . Diabetes Father   . Heart failure Brother    History  Substance Use Topics  . Smoking status: Current Every Day Smoker -- 0.50 packs/day for 20 years    Types: Cigarettes  . Smokeless tobacco: Never Used  . Alcohol Use: Yes     Comment: occasionally    Review of Systems  Constitutional: Negative for activity  change.       All ROS Neg except as noted in HPI  HENT: Positive for ear pain. Negative for nosebleeds.   Eyes: Negative for photophobia and discharge.  Respiratory: Negative for cough, shortness of breath and wheezing.   Cardiovascular: Positive for chest pain. Negative for palpitations.  Gastrointestinal: Negative.   Genitourinary: Negative.   Musculoskeletal: Negative for arthralgias, back pain and neck pain.  Skin: Negative.   Neurological: Positive for seizures. Negative for dizziness and speech difficulty.  Psychiatric/Behavioral: Negative for hallucinations and confusion.      Allergies  Morphine and related; Penicillins; Tramadol; and Ibuprofen  Home Medications   Prior to Admission medications   Medication Sig Start Date End Date Taking? Authorizing Provider  albuterol (PROVENTIL) (2.5 MG/3ML) 0.083% nebulizer solution Take 3 mLs (2.5 mg total) by nebulization every 6 (six) hours as needed for wheezing. 09/27/12   [place], NP  benazepril-hydrochlorthiazide (LOTENSIN HCT) 20-25 MG per tablet Take 1 tablet by mouth daily.    Historical Provider, MD  carbamide peroxide (DEBROX) 6.5 % otic solution 5 drops into right ear for 15 minutes then flush with warm water. 3 times per day 11/02/13   Tanna Furry, MD  ciprofloxacin (CIPRO) 500 MG tablet Take 1 tablet (500 mg total) by mouth 2 (  two) times daily. 04/19/13   Evalee Jefferson, PA-C  ciprofloxacin-dexamethasone (CIPRODEX) otic suspension Place 4 drops into the left ear 2 (two) times daily. 05/23/13   Johnna Acosta, MD  divalproex (DEPAKOTE ER) 500 MG 24 hr tablet Take 500 mg by mouth daily.    Historical Provider, MD  HYDROcodone-acetaminophen (NORCO) 5-325 MG per tablet Take 1-2 tablets by mouth every 6 (six) hours as needed (for pain). 06/16/13   Karen Chafe Molpus, MD  HYDROcodone-acetaminophen (NORCO/VICODIN) 5-325 MG per tablet Take 1 tablet by mouth every 4 (four) hours as needed for moderate pain. 04/19/13   Evalee Jefferson, PA-C   HYDROcodone-acetaminophen (NORCO/VICODIN) 5-325 MG per tablet Take 1 tablet by mouth every 4 (four) hours as needed for moderate pain. 06/22/13   Evalee Jefferson, PA-C  nitroGLYCERIN (NITROSTAT) 0.4 MG SL tablet Place 0.4 mg under the tongue every 5 (five) minutes as needed for chest pain.     Historical Provider, MD  phenytoin (DILANTIN) 100 MG ER capsule Take 300 mg by mouth every morning.      Historical Provider, MD  predniSONE (DELTASONE) 10 MG tablet 6, 5, 4, 3, 2 then 1 tablet by mouth daily for 6 days total. 06/22/13   Evalee Jefferson, PA-C  predniSONE (DELTASONE) 10 MG tablet 6, 5, 4, 3, 2 then 1 tablet by mouth daily for 6 days total. 08/24/13   Evalee Jefferson, PA-C  predniSONE (DELTASONE) 50 MG tablet Take 1 tablet (50 mg total) by mouth daily. 10/19/13   Julianne Rice, MD   BP 133/95  Pulse 80  Temp(Src) 98 F (36.7 C) (Oral)  Resp 18  SpO2 98% Physical Exam  Nursing note and vitals reviewed. Constitutional: He is oriented to person, place, and time. He appears well-developed and well-nourished.  Non-toxic appearance.  HENT:  Head: Normocephalic.  Right Ear: Tympanic membrane and external ear normal.  Left Ear: Tympanic membrane and external ear normal.  There is increased cerumen of the right ear, but the tympanic membrane is without increased redness or bulging. There is no swelling of the external auditory canal. There is no pain of the mastoid area.  There is soreness of the pre-and post auricular area on the left. There is swelling of the extra auditory canal with increase cerumen present. Minimal drainage present. The tympanic membrane is difficult to identify at this time.  Eyes: EOM and lids are normal. Pupils are equal, round, and reactive to light.  Neck: Normal range of motion. Neck supple. Carotid bruit is not present.  Cardiovascular: Normal rate, regular rhythm, normal heart sounds, intact distal pulses and normal pulses.   Pulmonary/Chest: Breath sounds normal. No respiratory  distress.  Abdominal: Soft. Bowel sounds are normal. There is no tenderness. There is no guarding.  Musculoskeletal: Normal range of motion.  Lymphadenopathy:       Head (right side): No submandibular adenopathy present.       Head (left side): No submandibular adenopathy present.    He has no cervical adenopathy.  Neurological: He is alert and oriented to person, place, and time. He has normal strength. No cranial nerve deficit or sensory deficit.  Skin: Skin is warm and dry.  Psychiatric: He has a normal mood and affect. His speech is normal.    ED Course  Procedures (including critical care time) Labs Review Labs Reviewed - No data to display  Imaging Review No results found.   EKG Interpretation None      MDM Patient has a left otitis externa. I  cannot visualize the tympanic membrane well. The patient will be covered with Cortisporin otic suspension, as well as Cipro. Prescription for Tylenol codeine given for pain. Patient strongly encouraged to see his primary physician for followup and for additional management.    Final diagnoses:  None    **I have reviewed nursing notes, vital signs, and all appropriate lab and imaging results for this patient.Lenox Ahr, PA-C 11/25/13 [phone]

## 2013-11-25 NOTE — Discharge Instructions (Signed)
Please use 3 Cortisporin otic drops in the left ear 4 times daily. Please use Cipro 2 times daily until all taken. Use Tylenol for mild pain, use Tylenol codeine for more severe pain. Tylenol codeine may cause drowsiness, please use with caution. Please see your primary physician as sone as possible for followup and additional management. It is IMPORTANT that you stop using Q-tips in your ears. Otitis Externa Otitis externa is a bacterial or fungal infection of the outer ear canal. This is the area from the eardrum to the outside of the ear. Otitis externa is sometimes called "swimmer's ear." CAUSES  Possible causes of infection include:  Swimming in dirty water.  Moisture remaining in the ear after swimming or bathing.  Mild injury (trauma) to the ear.  Objects stuck in the ear (foreign body).  Cuts or scrapes (abrasions) on the outside of the ear. SIGNS AND SYMPTOMS  The first symptom of infection is often itching in the ear canal. Later signs and symptoms may include swelling and redness of the ear canal, ear pain, and yellowish-white fluid (pus) coming from the ear. The ear pain may be worse when pulling on the earlobe. DIAGNOSIS  Your health care provider will perform a physical exam. A sample of fluid may be taken from the ear and examined for bacteria or fungi. TREATMENT  Antibiotic ear drops are often given for 10 to 14 days. Treatment may also include pain medicine or corticosteroids to reduce itching and swelling. HOME CARE INSTRUCTIONS   Apply antibiotic ear drops to the ear canal as prescribed by your health care provider.  Take medicines only as directed by your health care provider.  If you have diabetes, follow any additional treatment instructions from your health care provider.  Keep all follow-up visits as directed by your health care provider. PREVENTION   Keep your ear dry. Use the corner of a towel to absorb water out of the ear canal after swimming or  bathing.  Avoid scratching or putting objects inside your ear. This can damage the ear canal or remove the protective wax that lines the canal. This makes it easier for bacteria and fungi to grow.  Avoid swimming in lakes, polluted water, or poorly chlorinated pools.  You may use ear drops made of rubbing alcohol and vinegar after swimming. Combine equal parts of white vinegar and alcohol in a bottle. Put 3 or 4 drops into each ear after swimming. SEEK MEDICAL CARE IF:   You have a fever.  Your ear is still red, swollen, painful, or draining pus after 3 days.  Your redness, swelling, or pain gets worse.  You have a severe headache.  You have redness, swelling, pain, or tenderness in the area behind your ear. MAKE SURE YOU:   Understand these instructions.  Will watch your condition.  Will get help right away if you are not doing well or get worse. Document Released: 04/15/2005 Document Revised: 08/30/2013 Document Reviewed: 05/02/2011 [facility] Patient Information 2015 [place], [place]. This information is not intended to replace advice given to you by your health care provider. Make sure you discuss any questions you have with your health care provider.

## 2013-11-27 NOTE — ED Provider Notes (Signed)
Medical screening examination/treatment/procedure(s) were performed by non-physician practitioner and as supervising physician I was immediately available for consultation/collaboration.   EKG Interpretation None        Alfonzo Feller, DO 11/27/13 [phone]

## 2013-11-30 ENCOUNTER — Emergency Department (HOSPITAL_COMMUNITY): Payer: Medicare Other

## 2013-11-30 ENCOUNTER — Encounter (HOSPITAL_COMMUNITY): Payer: Self-pay | Admitting: Emergency Medicine

## 2013-11-30 ENCOUNTER — Emergency Department (HOSPITAL_COMMUNITY)
Admission: EM | Admit: 2013-11-30 | Discharge: 2013-11-30 | Disposition: A | Discharge status: Home / Self Care | Payer: Medicare Other | Attending: Emergency Medicine | Admitting: Emergency Medicine

## 2013-11-30 DIAGNOSIS — Z792 Long term (current) use of antibiotics: Secondary | ICD-10-CM | POA: Insufficient documentation

## 2013-11-30 DIAGNOSIS — W230XXA Caught, crushed, jammed, or pinched between moving objects, initial encounter: Secondary | ICD-10-CM | POA: Insufficient documentation

## 2013-11-30 DIAGNOSIS — F17200 Nicotine dependence, unspecified, uncomplicated: Secondary | ICD-10-CM | POA: Diagnosis not present

## 2013-11-30 DIAGNOSIS — Z880 Allergy status to penicillin: Secondary | ICD-10-CM | POA: Diagnosis not present

## 2013-11-30 DIAGNOSIS — Y9389 Activity, other specified: Secondary | ICD-10-CM | POA: Insufficient documentation

## 2013-11-30 DIAGNOSIS — I2510 Atherosclerotic heart disease of native coronary artery without angina pectoris: Secondary | ICD-10-CM | POA: Insufficient documentation

## 2013-11-30 DIAGNOSIS — IMO0002 Reserved for concepts with insufficient information to code with codable children: Secondary | ICD-10-CM | POA: Insufficient documentation

## 2013-11-30 DIAGNOSIS — S61219A Laceration without foreign body of unspecified finger without damage to nail, initial encounter: Secondary | ICD-10-CM

## 2013-11-30 DIAGNOSIS — I10 Essential (primary) hypertension: Secondary | ICD-10-CM | POA: Insufficient documentation

## 2013-11-30 DIAGNOSIS — G40909 Epilepsy, unspecified, not intractable, without status epilepticus: Secondary | ICD-10-CM | POA: Diagnosis not present

## 2013-11-30 DIAGNOSIS — S61209A Unspecified open wound of unspecified finger without damage to nail, initial encounter: Secondary | ICD-10-CM | POA: Diagnosis not present

## 2013-11-30 DIAGNOSIS — Y929 Unspecified place or not applicable: Secondary | ICD-10-CM | POA: Insufficient documentation

## 2013-11-30 DIAGNOSIS — Z79899 Other long term (current) drug therapy: Secondary | ICD-10-CM | POA: Diagnosis not present

## 2013-11-30 DIAGNOSIS — J45909 Unspecified asthma, uncomplicated: Secondary | ICD-10-CM | POA: Diagnosis not present

## 2013-11-30 MED ORDER — LIDOCAINE HCL (PF) 2 % IJ SOLN
INTRAMUSCULAR | Status: DC
Start: 2013-11-30 — End: 2013-12-01
  Filled 2013-11-30: qty 10

## 2013-11-30 NOTE — ED Notes (Signed)
Lac to rt ring finger , cut when unloading a riding mower.

## 2013-11-30 NOTE — Discharge Instructions (Signed)
Laceration Care, Adult A laceration is a cut that goes through all layers of the skin. The cut goes into the tissue beneath the skin. HOME CARE For stitches (sutures) or staples:  Keep the cut clean and dry.  If you have a bandage (dressing), change it at least once a day. Change the bandage if it gets wet or dirty, or as told by your doctor.  Wash the cut with soap and water 2 times a day. Rinse the cut with water. Pat it dry with a clean towel.  Put a thin layer of medicated cream on the cut as told by your doctor.  You may shower after the first 24 hours. Do not soak the cut in water until the stitches are removed.  Only take medicines as told by your doctor.  Have your stitches or staples removed as told by your doctor. For skin adhesive strips:  Keep the cut clean and dry.  Do not get the strips wet. You may take a bath, but be careful to keep the cut dry.  If the cut gets wet, pat it dry with a clean towel.  The strips will fall off on their own. Do not remove the strips that are still stuck to the cut. For wound glue:  You may shower or take baths. Do not soak or scrub the cut. Do not swim. Avoid heavy sweating until the glue falls off on its own. After a shower or bath, pat the cut dry with a clean towel.  Do not put medicine on your cut until the glue falls off.  If you have a bandage, do not put tape over the glue.  Avoid lots of sunlight or tanning lamps until the glue falls off. Put sunscreen on the cut for the first year to reduce your scar.  The glue will fall off on its own. Do not pick at the glue. You may need a tetanus shot if:  You cannot remember when you had your last tetanus shot.  You have never had a tetanus shot. If you need a tetanus shot and you choose not to have one, you may get tetanus. Sickness from tetanus can be serious. GET HELP RIGHT AWAY IF:   Your pain does not get better with medicine.  Your arm, hand, leg, or foot loses feeling  (numbness) or changes color.  Your cut is bleeding.  Your joint feels weak, or you cannot use your joint.  You have painful lumps on your body.  Your cut is red, puffy (swollen), or painful.  You have a red line on the skin near the cut.  You have yellowish-white fluid (pus) coming from the cut.  You have a fever.  You have a bad smell coming from the cut or bandage.  Your cut breaks open before or after stitches are removed.  You notice something coming out of the cut, such as wood or glass.  You cannot move a finger or toe. MAKE SURE YOU:   Understand these instructions.  Will watch your condition.  Will get help right away if you are not doing well or get worse. Document Released: 10/02/2007 Document Revised: 07/08/2011 Document Reviewed: 10/09/2010 [facility] Patient Information 2015 [place], [place]. This information is not intended to replace advice given to you by your health care provider. Make sure you discuss any questions you have with your health care provider.   Keep your wounds clean and dry.  Have them rechecked if you develop any problems such as  worse pain,  Swelling,  Redness.

## 2013-12-02 NOTE — ED Provider Notes (Signed)
CSN: [id number]     Arrival date & time 11/30/13  1936 History   First MD Initiated Contact with Patient 11/30/13 1949     Chief Complaint  Patient presents with  . Finger Injury     (Consider location/radiation/quality/duration/timing/severity/associated sxs/prior Treatment) HPI  Chad Hampton is a 33 y.o. male presenting with right distal right finger crush injury and laceration he incurred when it was caught between wood and the mower deck when unloading it from a trailer.  He reports moderate pain and the bleeding from the wound which has improved with application of pressure.  He denies numbness in the finger tip.  His tetanus is utd.    Past Medical History  Diagnosis Date  . Seizure disorder   . Asthma   . Hypertension   . Acute angina   . CAD (coronary artery disease)   . Seizures    Past Surgical History  Procedure Laterality Date  . Cholecystectomy    . Eye surgery    . Facial reconstruction surgery     Family History  Problem Relation Age of Onset  . Diabetes Mother   . Hypertension Mother   . Heart failure Father   . Stroke Father   . Diabetes Father   . Heart failure Brother    History  Substance Use Topics  . Smoking status: Current Every Day Smoker -- 0.50 packs/day for 20 years    Types: Cigarettes  . Smokeless tobacco: Never Used  . Alcohol Use: Yes     Comment: occasionally    Review of Systems  Constitutional: Negative for fever.  Musculoskeletal: Positive for arthralgias. Negative for joint swelling and myalgias.  Skin: Positive for wound.  Neurological: Negative for weakness and numbness.      Allergies  Morphine and related; Penicillins; Tramadol; and Ibuprofen  Home Medications   Prior to Admission medications   Medication Sig Start Date End Date Taking? Authorizing Provider  acetaminophen-codeine (TYLENOL #3) 300-30 MG per tablet Take 1-2 tablets by mouth every 6 (six) hours as needed for moderate pain. 11/25/13  Yes Lenox Ahr, PA-C  albuterol (PROVENTIL) (2.5 MG/3ML) 0.083% nebulizer solution Take 3 mLs (2.5 mg total) by nebulization every 6 (six) hours as needed for wheezing. 09/27/12  Yes Hope Bunnie Pion, NP  ciprofloxacin (CIPRO) 500 MG tablet Take 1 tablet (500 mg total) by mouth 2 (two) times daily. 11/25/13  Yes Lenox Ahr, PA-C  benazepril-hydrochlorthiazide (LOTENSIN HCT) 20-25 MG per tablet Take 1 tablet by mouth daily.    Historical Provider, MD  carbamide peroxide (DEBROX) 6.5 % otic solution 5 drops into right ear for 15 minutes then flush with warm water. 3 times per day 11/02/13   Tanna Furry, MD  ciprofloxacin (CIPRO) 500 MG tablet Take 1 tablet (500 mg total) by mouth 2 (two) times daily. 04/19/13   Evalee Jefferson, PA-C  ciprofloxacin-dexamethasone (CIPRODEX) otic suspension Place 4 drops into the left ear 2 (two) times daily. 05/23/13   Johnna Acosta, MD  divalproex (DEPAKOTE ER) 500 MG 24 hr tablet Take 500 mg by mouth daily.    Historical Provider, MD  HYDROcodone-acetaminophen (NORCO) 5-325 MG per tablet Take 1-2 tablets by mouth every 6 (six) hours as needed (for pain). 06/16/13   Karen Chafe Molpus, MD  HYDROcodone-acetaminophen (NORCO/VICODIN) 5-325 MG per tablet Take 1 tablet by mouth every 4 (four) hours as needed for moderate pain. 04/19/13   Evalee Jefferson, PA-C  HYDROcodone-acetaminophen (NORCO/VICODIN) 5-325 MG per tablet Take  1 tablet by mouth every 4 (four) hours as needed for moderate pain. 06/22/13   Evalee Jefferson, PA-C  nitroGLYCERIN (NITROSTAT) 0.4 MG SL tablet Place 0.4 mg under the tongue every 5 (five) minutes as needed for chest pain.     Historical Provider, MD  phenytoin (DILANTIN) 100 MG ER capsule Take 300 mg by mouth every morning.      Historical Provider, MD  predniSONE (DELTASONE) 10 MG tablet 6, 5, 4, 3, 2 then 1 tablet by mouth daily for 6 days total. 06/22/13   Evalee Jefferson, PA-C  predniSONE (DELTASONE) 10 MG tablet 6, 5, 4, 3, 2 then 1 tablet by mouth daily for 6 days total. 08/24/13   Evalee Jefferson, PA-C  predniSONE (DELTASONE) 50 MG tablet Take 1 tablet (50 mg total) by mouth daily. 10/19/13   Julianne Rice, MD   BP 107/72  Pulse 92  Temp(Src) 98.9 F (37.2 C) (Oral)  Resp 20  Ht 6' (1.829 m)  Wt 258 lb (117.028 kg)  BMI 34.98 kg/m2  SpO2 97% Physical Exam  Constitutional: He appears well-developed and well-nourished.  HENT:  Head: Atraumatic.  Neck: Normal range of motion.  Cardiovascular:  Pulses equal bilaterally  Neurological: He is alert. He has normal strength. He displays normal reflexes. No sensory deficit.  Skin: Skin is warm and dry.  Deep abrasion right long finger, proximal to the nail plate, well approximated,  0.25 cm.  Nail plate intact,  Distal nail cut shorter than the nail bed (all his nails are cut this way).  Less than 2 sec distal cap refill  Psychiatric: He has a normal mood and affect.    ED Course  NERVE BLOCK Date/Time: 11/30/2013 8:25 PM Performed by: Evalee Jefferson Authorized by: Evalee Jefferson Risks and benefits: risks, benefits and alternatives were discussed Consent given by: patient Patient identity confirmed: verbally with patient Indications: pain relief Body area: upper extremity Nerve: digital Laterality: right Patient sedated: no Preparation: Patient was prepped and draped in the usual sterile fashion. Patient position: sitting Needle gauge: 25 G Location technique: anatomical landmarks Local anesthetic: lidocaine 2% without epinephrine Anesthetic total: 1.5 ml Outcome: pain improved Patient tolerance: Patient tolerated the procedure well with no immediate complications.   (including critical care time) Labs Review Labs Reviewed - No data to display  Imaging Review Dg Finger Ring Right  11/30/2013   CLINICAL DATA:  Laceration.  EXAM: RIGHT RING FINGER 2+V  COMPARISON:  None.  FINDINGS: There is no evidence of fracture or dislocation. There is no evidence of arthropathy or other focal bone abnormality. Soft tissues are  unremarkable.  IMPRESSION: Negative.   Electronically Signed   By: Franchot Gallo M.D.   On: 11/30/2013 20:48     EKG Interpretation None      MDM   Final diagnoses:  Finger laceration, initial encounter    Patients labs and/or radiological studies were viewed and considered during the medical decision making and disposition process. Finger was cleaned using betadine and saline,  Xeroform and bulky dressing.  Encouraged ice,  Ibuprofen prn pain.  Pt was given digital block prior to going to xray.  After wound exploration, no wound repair required.      Evalee Jefferson, PA-C 12/02/13 1527

## 2013-12-03 NOTE — ED Provider Notes (Signed)
Medical screening examination/treatment/procedure(s) were performed by non-physician practitioner and as supervising physician I was immediately available for consultation/collaboration.   EKG Interpretation None      Rolland Porter, MD, Abram Sander   Janice Norrie, MD 12/03/13 [phone]

## 2013-12-13 ENCOUNTER — Emergency Department (HOSPITAL_COMMUNITY)
Admission: EM | Admit: 2013-12-13 | Discharge: 2013-12-14 | Disposition: A | Discharge status: Home / Self Care | Payer: Medicare Other | Attending: Emergency Medicine | Admitting: Emergency Medicine

## 2013-12-13 ENCOUNTER — Encounter (HOSPITAL_COMMUNITY): Payer: Self-pay | Admitting: Emergency Medicine

## 2013-12-13 DIAGNOSIS — D72829 Elevated white blood cell count, unspecified: Secondary | ICD-10-CM | POA: Insufficient documentation

## 2013-12-13 DIAGNOSIS — I2510 Atherosclerotic heart disease of native coronary artery without angina pectoris: Secondary | ICD-10-CM | POA: Insufficient documentation

## 2013-12-13 DIAGNOSIS — R079 Chest pain, unspecified: Secondary | ICD-10-CM | POA: Diagnosis not present

## 2013-12-13 DIAGNOSIS — G4489 Other headache syndrome: Secondary | ICD-10-CM | POA: Insufficient documentation

## 2013-12-13 DIAGNOSIS — Z880 Allergy status to penicillin: Secondary | ICD-10-CM | POA: Diagnosis not present

## 2013-12-13 DIAGNOSIS — I10 Essential (primary) hypertension: Secondary | ICD-10-CM | POA: Insufficient documentation

## 2013-12-13 DIAGNOSIS — F17200 Nicotine dependence, unspecified, uncomplicated: Secondary | ICD-10-CM | POA: Diagnosis not present

## 2013-12-13 DIAGNOSIS — I209 Angina pectoris, unspecified: Secondary | ICD-10-CM | POA: Diagnosis not present

## 2013-12-13 DIAGNOSIS — G8929 Other chronic pain: Secondary | ICD-10-CM | POA: Diagnosis not present

## 2013-12-13 DIAGNOSIS — R51 Headache: Secondary | ICD-10-CM | POA: Diagnosis not present

## 2013-12-13 DIAGNOSIS — J45909 Unspecified asthma, uncomplicated: Secondary | ICD-10-CM | POA: Diagnosis not present

## 2013-12-13 DIAGNOSIS — Z79899 Other long term (current) drug therapy: Secondary | ICD-10-CM | POA: Diagnosis not present

## 2013-12-13 DIAGNOSIS — G40909 Epilepsy, unspecified, not intractable, without status epilepticus: Secondary | ICD-10-CM | POA: Diagnosis not present

## 2013-12-13 DIAGNOSIS — E162 Hypoglycemia, unspecified: Secondary | ICD-10-CM | POA: Insufficient documentation

## 2013-12-13 LAB — BASIC METABOLIC PANEL
ANION GAP: 15 (ref 5–15)
BUN: 6 mg/dL (ref 6–23)
CHLORIDE: 102 meq/L (ref 96–112)
CO2: 24 meq/L (ref 19–32)
Calcium: 9.4 mg/dL (ref 8.4–10.5)
Creatinine, Ser: 0.85 mg/dL (ref 0.50–1.35)
GFR calc Af Amer: >90 mL/min (ref >90)
GFR calc non Af Amer: >90 mL/min (ref >90)
Glucose, Bld: 103 mg/dL — ABNORMAL HIGH (ref 70–99)
Potassium: 3.9 mEq/L (ref 3.7–5.3)
SODIUM: 141 meq/L (ref 137–147)

## 2013-12-13 LAB — CBC WITH DIFFERENTIAL/PLATELET
BASOS ABS: 0 10*3/uL (ref 0.0–0.1)
Basophils Relative: 0 % (ref 0–1)
Eosinophils Absolute: 0.3 10*3/uL (ref 0.0–0.7)
Eosinophils Relative: 2 % (ref 0–5)
HEMATOCRIT: 47.1 % (ref 39.0–52.0)
Hemoglobin: 16.4 g/dL (ref 13.0–17.0)
LYMPHS PCT: 32 % (ref 12–46)
Lymphs Abs: 4.5 10*3/uL — ABNORMAL HIGH (ref 0.7–4.0)
MCH: 30.9 pg (ref 26.0–34.0)
MCHC: 34.8 g/dL (ref 30.0–36.0)
MCV: 88.9 fL (ref 78.0–100.0)
MONO ABS: 0.5 10*3/uL (ref 0.1–1.0)
Monocytes Relative: 4 % (ref 3–12)
NEUTROS ABS: 8.6 10*3/uL — AB (ref 1.7–7.7)
Neutrophils Relative %: 62 % (ref 43–77)
PLATELETS: 254 10*3/uL (ref 150–400)
RBC: 5.3 MIL/uL (ref 4.22–5.81)
RDW: 13.2 % (ref 11.5–15.5)
WBC: 14 10*3/uL — AB (ref 4.0–10.5)

## 2013-12-13 LAB — PHENYTOIN LEVEL, TOTAL

## 2013-12-13 LAB — TROPONIN I: Troponin I: <0.3 ng/mL (ref <0.30)

## 2013-12-13 LAB — VALPROIC ACID LEVEL

## 2013-12-13 MED ORDER — ACETAMINOPHEN 500 MG PO TABS
1000.0000 mg | ORAL_TABLET | Freq: Once | ORAL | Status: AC
  Administered 2013-12-13: 1000 mg via ORAL
  Filled 2013-12-13: qty 2

## 2013-12-13 MED ORDER — ONDANSETRON 4 MG PO TBDP
4.0000 mg | ORAL_TABLET | Freq: Once | ORAL | Status: AC
  Administered 2013-12-13: 4 mg via ORAL
  Filled 2013-12-13: qty 1

## 2013-12-13 NOTE — ED Notes (Signed)
Checked glucose at home and it was 75.  Was told it was too low.  Says he feels like his sugar is too low.

## 2013-12-13 NOTE — ED Notes (Signed)
CBG 96 in triage.

## 2013-12-13 NOTE — ED Provider Notes (Addendum)
TIME SEEN: 10:00 PM  CHIEF COMPLAINT: Headache, nausea, chest pain, feeling "jittery"  HPI: Patient is a 33 y.o. M with history of seizure disorder, asthma, hypertension, tobacco use who presents to the emergency department with complaints of feeling jittery earlier today, intermittent chronic chest pain, diffuse gradual onset throbbing headache and nausea. States he has had this chest pain intermittently for several years. Unable to describe the pain. It does not appear exertional or pleuritic. Denies any shortness of breath, vomiting, diaphoresis. States he has had some diarrhea today. Patient is also felt slightly lightheaded and "jittery". No aggravating or alleviating factors. He states he felt like his blood sugar was low. At home it was 75. In the emergency department is 96. He does not have a known history of diabetes. No numbness, tingling or focal weakness. No history of head injury. No history of PE or DVT. He is not on anticoagulation.   Patient reports he has a father who had an MI in his 47s and a brother had an MI at 61 years old.   ROS: See HPI Constitutional: no fever  Eyes: no drainage  ENT: no runny nose   Cardiovascular:   chest pain  Resp: no SOB  GI: no vomiting GU: no dysuria Integumentary: no rash  Allergy: no hives  Musculoskeletal: no leg swelling  Neurological: no slurred speech ROS otherwise negative  PAST MEDICAL HISTORY/PAST SURGICAL HISTORY:  Past Medical History  Diagnosis Date  . Seizure disorder   . Asthma   . Hypertension   . Acute angina   . CAD (coronary artery disease)   . Seizures     MEDICATIONS:  Prior to Admission medications   Medication Sig Start Date End Date Taking? Authorizing Provider  albuterol (PROVENTIL) (2.5 MG/3ML) 0.083% nebulizer solution Take 3 mLs (2.5 mg total) by nebulization every 6 (six) hours as needed for wheezing. 09/27/12  Yes [place], NP  benazepril-hydrochlorthiazide (LOTENSIN HCT) 20-25 MG per tablet Take 1  tablet by mouth daily.    Historical Provider, MD  divalproex (DEPAKOTE ER) 500 MG 24 hr tablet Take 500 mg by mouth daily.    Historical Provider, MD  nitroGLYCERIN (NITROSTAT) 0.4 MG SL tablet Place 0.4 mg under the tongue every 5 (five) minutes as needed for chest pain.     Historical Provider, MD  phenytoin (DILANTIN) 100 MG ER capsule Take 300 mg by mouth every morning.      Historical Provider, MD    ALLERGIES:  Allergies  Allergen Reactions  . Morphine And Related Shortness Of Breath  . Penicillins Other (See Comments)    Pt states high severity reaction but unknown since childhood  . Tramadol     Seizures   . Ibuprofen Rash    SOCIAL HISTORY:  History  Substance Use Topics  . Smoking status: Current Every Day Smoker -- 0.50 packs/day for 20 years    Types: Cigarettes  . Smokeless tobacco: Never Used  . Alcohol Use: Yes     Comment: occasionally    FAMILY HISTORY: Family History  Problem Relation Age of Onset  . Diabetes Mother   . Hypertension Mother   . Heart failure Father   . Stroke Father   . Diabetes Father   . Heart failure Brother     EXAM: BP 130/91  Pulse 75  Temp(Src) 98.7 F (37.1 C) (Oral)  Resp 17  Ht 6' (1.829 m)  Wt 285 lb (129.275 kg)  BMI 38.64 kg/m2  SpO2 98% CONSTITUTIONAL:  Alert and oriented and responds appropriately to questions. Well-appearing; well-nourished HEAD: Normocephalic EYES: Conjunctivae clear, PERRL ENT: normal nose; no rhinorrhea; moist mucous membranes; pharynx without lesions noted NECK: Supple, no meningismus, no LAD  CARD: RRR; S1 and S2 appreciated; no murmurs, no clicks, no rubs, no gallops RESP: Normal chest excursion without splinting or tachypnea; breath sounds clear and equal bilaterally; no wheezes, no rhonchi, no rales,  ABD/GI: Normal bowel sounds; non-distended; soft, non-tender, no rebound, no guarding BACK:  The back appears normal and is non-tender to palpation, there is no CVA tenderness EXT: Normal  ROM in all joints; non-tender to palpation; no edema; normal capillary refill; no cyanosis    SKIN: Normal color for age and race; warm NEURO: Moves all extremities equally; sensation to light touch intact diffusely, cranial nerves II through XII intact, normal gait PSYCH: The patient's mood and manner are appropriate. Grooming and personal hygiene are appropriate.  MEDICAL DECISION MAKING: Patient here with complaints of chronic intermittent chest pain, nausea, diarrhea, lightheadedness, headache. He is well-appearing, hemodynamically stable, neurologically intact. Given he does have a history of hypertension, tobacco use and a significant family history of cardiac disease, will check EKG and one troponin. We'll give Tylenol and Zofran for his headache and nausea. We'll by mouth challenge. Anticipate if workup is negative, patient can be discharged home. He has no risk factors for pulmonary embolus other than tobacco use. Describes his chest pain as chronic and unchanged, doubt dissection.  ED PROGRESS: Labs unremarkable other than a mild leukocytosis which is chronic. Troponin negative. Last chest x-ray was 10/19/13 and was normal. He has had multiple imaging studies. I do not feel this needs to be repeated today given he reports his chest pain is chronic. He has subtherapeutic Depakote and Dilantin levels, no signs of toxicity. I feel he is safe to be discharged home with outpatient followup. Discussed return precautions. Patient verbalizes understanding and is comfortable plan. Counseled patient on the importance of tobacco cessation.     EKG Interpretation  Date/Time:  Monday December 13 2013 21:42:17 EDT Ventricular Rate:  93 PR Interval:  138 QRS Duration: 89 QT Interval:  346 QTC Calculation: 430 R Axis:   56 Text Interpretation:  Sinus rhythm No significant change since last tracing Confirmed by Cheila Wickstrom,  DO, Finley Dinkel [phone]) on 12/13/2013 11:12:36 PM          [place],  DO 12/13/13 [place], DO 12/13/13 [place] Isabela Nardelli, DO 12/13/13 [id number]

## 2013-12-13 NOTE — Discharge Instructions (Signed)
Chest Pain (Nonspecific) °It is often hard to give a specific diagnosis for the cause of chest pain. There is always a chance that your pain could be related to something serious, such as a heart attack or a blood clot in the lungs. You need to follow up with your health care provider for further evaluation. °CAUSES  °· Heartburn. °· Pneumonia or bronchitis. °· Anxiety or stress. °· Inflammation around your heart (pericarditis) or lung (pleuritis or pleurisy). °· A blood clot in the lung. °· A collapsed lung (pneumothorax). It can develop suddenly on its own (spontaneous pneumothorax) or from trauma to the chest. °· Shingles infection (herpes zoster virus). °The chest wall is composed of bones, muscles, and cartilage. Any of these can be the source of the pain. °· The bones can be bruised by injury. °· The muscles or cartilage can be strained by coughing or overwork. °· The cartilage can be affected by inflammation and become sore (costochondritis). °DIAGNOSIS  °Lab tests or other studies may be needed to find the cause of your pain. Your health care provider may have you take a test called an ambulatory electrocardiogram (ECG). An ECG records your heartbeat patterns over a 24-hour period. You may also have other tests, such as: °· Transthoracic echocardiogram (TTE). During echocardiography, sound waves are used to evaluate how blood flows through your heart. °· Transesophageal echocardiogram (TEE). °· Cardiac monitoring. This allows your health care provider to monitor your heart rate and rhythm in real time. °· Holter monitor. This is a portable device that records your heartbeat and can help diagnose heart arrhythmias. It allows your health care provider to track your heart activity for several days, if needed. °· Stress tests by exercise or by giving medicine that makes the heart beat faster. °TREATMENT  °· Treatment depends on what may be causing your chest pain. Treatment may include: °¨ Acid blockers for  heartburn. °¨ Anti-inflammatory medicine. °¨ Pain medicine for inflammatory conditions. °¨ Antibiotics if an infection is present. °· You may be advised to change lifestyle habits. This includes stopping smoking and avoiding alcohol, caffeine, and chocolate. °· You may be advised to keep your head raised (elevated) when sleeping. This reduces the chance of acid going backward from your stomach into your esophagus. °Most of the time, nonspecific chest pain will improve within 2-3 days with rest and mild pain medicine.  °HOME CARE INSTRUCTIONS  °· If antibiotics were prescribed, take them as directed. Finish them even if you start to feel better. °· For the next few days, avoid physical activities that bring on chest pain. Continue physical activities as directed. °· Do not use any tobacco products, including cigarettes, chewing tobacco, or electronic cigarettes. °· Avoid drinking alcohol. °· Only take medicine as directed by your health care provider. °· Follow your health care provider's suggestions for further testing if your chest pain does not go away. °· Keep any follow-up appointments you made. If you do not go to an appointment, you could develop lasting (chronic) problems with pain. If there is any problem keeping an appointment, call to reschedule. °SEEK MEDICAL CARE IF:  °· Your chest pain does not go away, even after treatment. °· You have a rash with blisters on your chest. °· You have a fever. °SEEK IMMEDIATE MEDICAL CARE IF:  °· You have increased chest pain or pain that spreads to your arm, neck, jaw, back, or abdomen. °· You have shortness of breath. °· You have an increasing cough, or you cough   up blood. °· You have severe back or abdominal pain. °· You feel nauseous or vomit. °· You have severe weakness. °· You faint. °· You have chills. °This is an emergency. Do not wait to see if the pain will go away. Get medical help at once. Call your local emergency services (911 in [place] Do not drive  yourself to the hospital. °MAKE SURE YOU:  °· Understand these instructions. °· Will watch your condition. °· Will get help right away if you are not doing well or get worse. °Document Released: 01/23/2005 Document Revised: 04/20/2013 Document Reviewed: 11/19/2007 °[employer]® Patient Information ©2015 [employer]. This information is not intended to replace advice given to you by your health care provider. Make sure you discuss any questions you have with your health care provider. °General Headache Without Cause °A headache is pain or discomfort felt around the head or neck area. The specific cause of a headache may not be found. There are many causes and types of headaches. A few common ones are: °· Tension headaches. °· Migraine headaches. °· Cluster headaches. °· Chronic daily headaches. °HOME CARE INSTRUCTIONS  °· Keep all follow-up appointments with your caregiver or any specialist referral. °· Only take over-the-counter or prescription medicines for pain or discomfort as directed by your caregiver. °· Lie down in a dark, quiet room when you have a headache. °· Keep a headache journal to find out what may trigger your migraine headaches. For example, write down: °¨ What you eat and drink. °¨ How much sleep you get. °¨ Any change to your diet or medicines. °· Try massage or other relaxation techniques. °· Put ice packs or heat on the head and neck. Use these 3 to 4 times per day for 15 to 20 minutes each time, or as needed. °· Limit stress. °· Sit up straight, and do not tense your muscles. °· Quit smoking if you smoke. °· Limit alcohol use. °· Decrease the amount of caffeine you drink, or stop drinking caffeine. °· Eat and sleep on a regular schedule. °· Get 7 to 9 hours of sleep, or as recommended by your caregiver. °· Keep lights dim if bright lights bother you and make your headaches worse. °SEEK MEDICAL CARE IF:  °· You have problems with the medicines you were prescribed. °· Your medicines are not  working. °· You have a change from the usual headache. °· You have nausea or vomiting. °SEEK IMMEDIATE MEDICAL CARE IF:  °· Your headache becomes severe. °· You have a fever. °· You have a stiff neck. °· You have loss of vision. °· You have muscular weakness or loss of muscle control. °· You start losing your balance or have trouble walking. °· You feel faint or pass out. °· You have severe symptoms that are different from your first symptoms. °MAKE SURE YOU:  °· Understand these instructions. °· Will watch your condition. °· Will get help right away if you are not doing well or get worse. °Document Released: 04/15/2005 Document Revised: 07/08/2011 Document Reviewed: 05/01/2011 °Exit[employer]® Patient Information ©2015 [employer]. This information is not intended to replace advice given to you by your health care provider. Make sure you discuss any questions you have with your health care provider. ° °

## 2013-12-14 DIAGNOSIS — I10 Essential (primary) hypertension: Secondary | ICD-10-CM | POA: Diagnosis not present

## 2013-12-14 LAB — CBG MONITORING, ED: GLUCOSE-CAPILLARY: 96 mg/dL (ref 70–99)

## 2013-12-15 DIAGNOSIS — I209 Angina pectoris, unspecified: Secondary | ICD-10-CM | POA: Diagnosis not present

## 2013-12-15 DIAGNOSIS — E119 Type 2 diabetes mellitus without complications: Secondary | ICD-10-CM | POA: Diagnosis not present

## 2013-12-15 DIAGNOSIS — Z0000 Encounter for general adult medical examination without abnormal findings: Secondary | ICD-10-CM | POA: Diagnosis not present

## 2013-12-15 DIAGNOSIS — I10 Essential (primary) hypertension: Secondary | ICD-10-CM | POA: Diagnosis not present

## 2013-12-23 DIAGNOSIS — R569 Unspecified convulsions: Secondary | ICD-10-CM | POA: Diagnosis not present

## 2013-12-23 DIAGNOSIS — I209 Angina pectoris, unspecified: Secondary | ICD-10-CM | POA: Diagnosis not present

## 2013-12-23 DIAGNOSIS — I10 Essential (primary) hypertension: Secondary | ICD-10-CM | POA: Diagnosis not present

## 2013-12-23 DIAGNOSIS — R079 Chest pain, unspecified: Secondary | ICD-10-CM | POA: Diagnosis not present

## 2013-12-23 DIAGNOSIS — Z91199 Patient's noncompliance with other medical treatment and regimen due to unspecified reason: Secondary | ICD-10-CM | POA: Diagnosis not present

## 2013-12-23 DIAGNOSIS — Z9119 Patient's noncompliance with other medical treatment and regimen: Secondary | ICD-10-CM | POA: Diagnosis not present

## 2013-12-23 DIAGNOSIS — M7989 Other specified soft tissue disorders: Secondary | ICD-10-CM | POA: Diagnosis not present

## 2013-12-23 DIAGNOSIS — G8929 Other chronic pain: Secondary | ICD-10-CM | POA: Diagnosis not present

## 2013-12-23 DIAGNOSIS — I2510 Atherosclerotic heart disease of native coronary artery without angina pectoris: Secondary | ICD-10-CM | POA: Diagnosis not present

## 2013-12-23 DIAGNOSIS — E119 Type 2 diabetes mellitus without complications: Secondary | ICD-10-CM | POA: Diagnosis not present

## 2013-12-28 DIAGNOSIS — R609 Edema, unspecified: Secondary | ICD-10-CM | POA: Diagnosis not present

## 2013-12-28 DIAGNOSIS — I359 Nonrheumatic aortic valve disorder, unspecified: Secondary | ICD-10-CM | POA: Diagnosis not present

## 2013-12-28 DIAGNOSIS — I519 Heart disease, unspecified: Secondary | ICD-10-CM | POA: Diagnosis not present

## 2013-12-28 DIAGNOSIS — R079 Chest pain, unspecified: Secondary | ICD-10-CM | POA: Diagnosis not present

## 2014-01-17 DIAGNOSIS — R221 Localized swelling, mass and lump, neck: Secondary | ICD-10-CM | POA: Diagnosis not present

## 2014-01-17 DIAGNOSIS — Z825 Family history of asthma and other chronic lower respiratory diseases: Secondary | ICD-10-CM | POA: Diagnosis not present

## 2014-01-17 DIAGNOSIS — I10 Essential (primary) hypertension: Secondary | ICD-10-CM | POA: Diagnosis not present

## 2014-01-17 DIAGNOSIS — R569 Unspecified convulsions: Secondary | ICD-10-CM | POA: Diagnosis not present

## 2014-01-17 DIAGNOSIS — Z79899 Other long term (current) drug therapy: Secondary | ICD-10-CM | POA: Diagnosis not present

## 2014-01-17 DIAGNOSIS — J45909 Unspecified asthma, uncomplicated: Secondary | ICD-10-CM | POA: Diagnosis not present

## 2014-01-17 DIAGNOSIS — R220 Localized swelling, mass and lump, head: Secondary | ICD-10-CM | POA: Diagnosis not present

## 2014-01-17 DIAGNOSIS — F17200 Nicotine dependence, unspecified, uncomplicated: Secondary | ICD-10-CM | POA: Diagnosis not present

## 2014-03-04 DIAGNOSIS — I10 Essential (primary) hypertension: Secondary | ICD-10-CM | POA: Diagnosis not present

## 2014-03-04 DIAGNOSIS — E1149 Type 2 diabetes mellitus with other diabetic neurological complication: Secondary | ICD-10-CM | POA: Diagnosis not present

## 2014-04-11 DIAGNOSIS — S6991XA Unspecified injury of right wrist, hand and finger(s), initial encounter: Secondary | ICD-10-CM | POA: Diagnosis not present

## 2014-04-11 DIAGNOSIS — M79641 Pain in right hand: Secondary | ICD-10-CM | POA: Diagnosis not present

## 2014-04-11 DIAGNOSIS — S60221A Contusion of right hand, initial encounter: Secondary | ICD-10-CM | POA: Diagnosis not present

## 2014-04-11 DIAGNOSIS — M7989 Other specified soft tissue disorders: Secondary | ICD-10-CM | POA: Diagnosis not present

## 2014-05-16 IMAGING — CT CT HEAD W/O CM
1 series · 16 of 30 positions shown, 20 images · non-contrast
Comparison: 11/19/2010 and prior head CTs dating back to 01/24/2004

CLINICAL DATA: 32-year-old male with seizure.

CT HEAD WITHOUT CONTRAST
TECHNIQUE: Contiguous axial images were obtained from the base of
the skull through the vertex without contrast.

[Series 2: headseq 4.8 h37s · axial · 0.42mm/px · z∈[+102,+257]mm · 16 of 36 slices shown, 20 images]
[im 2/36  brain]
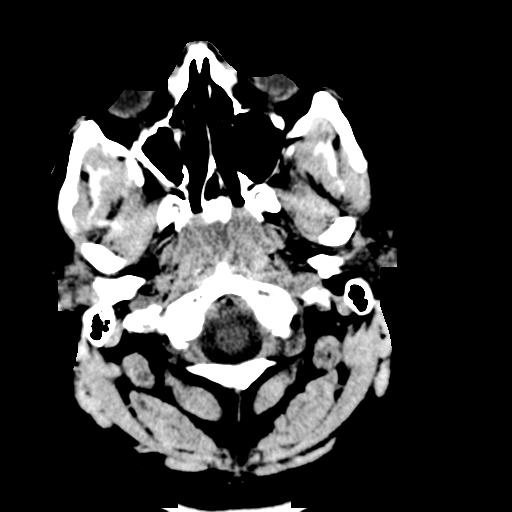
[im 2/36  bone]
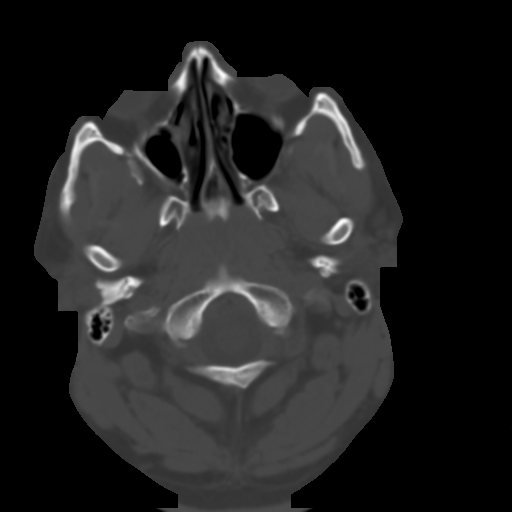
[im 4/36  brain]
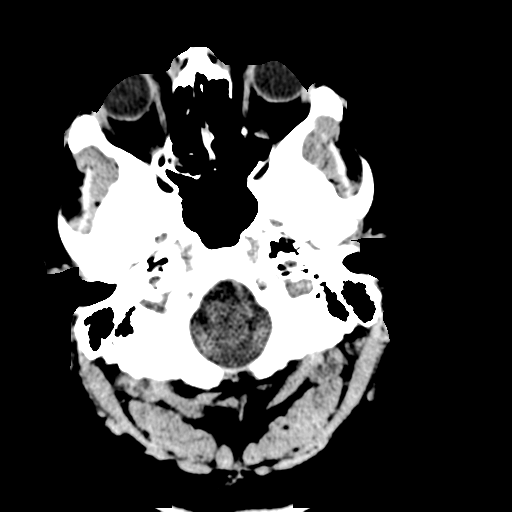
[im 7/36  brain]
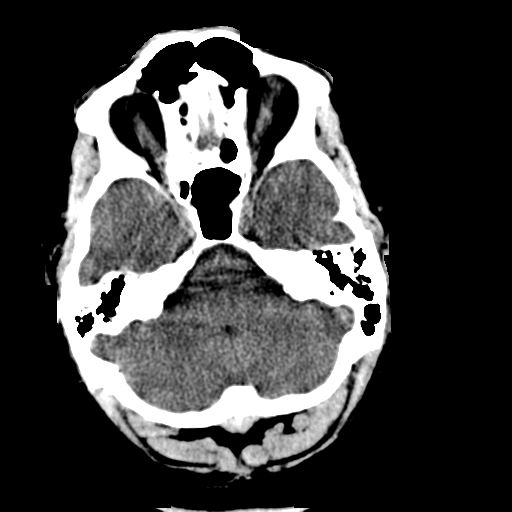
[im 9/36  brain]
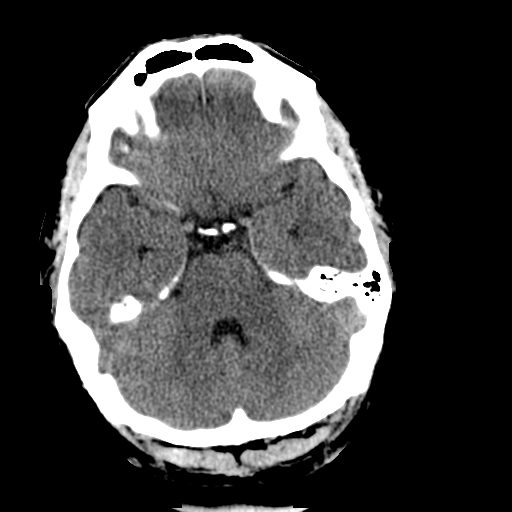
[im 10/36  brain]
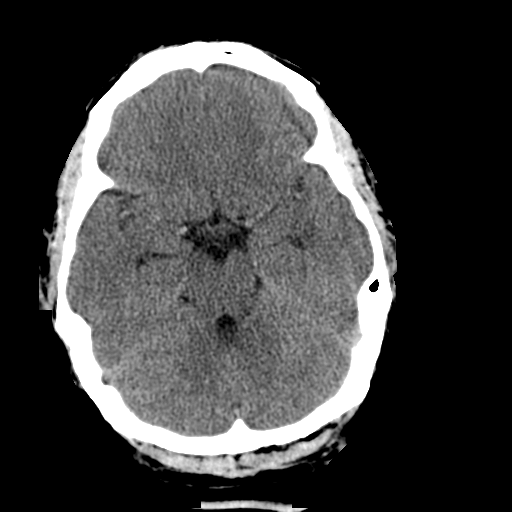
[im 10/36  bone]
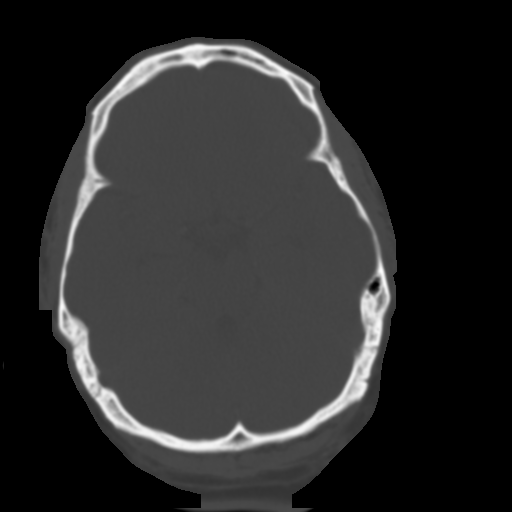
[im 13/36  brain]
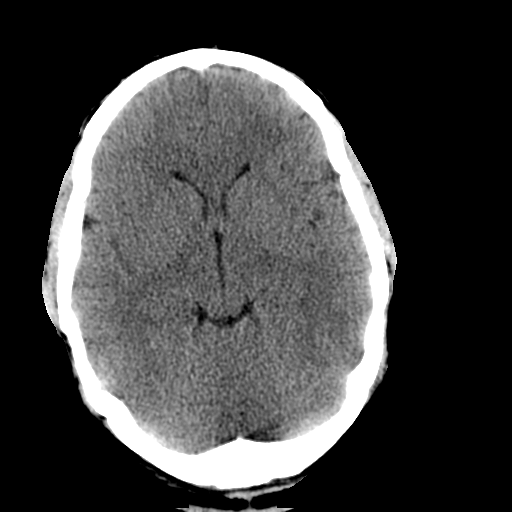
[im 15/36  brain]
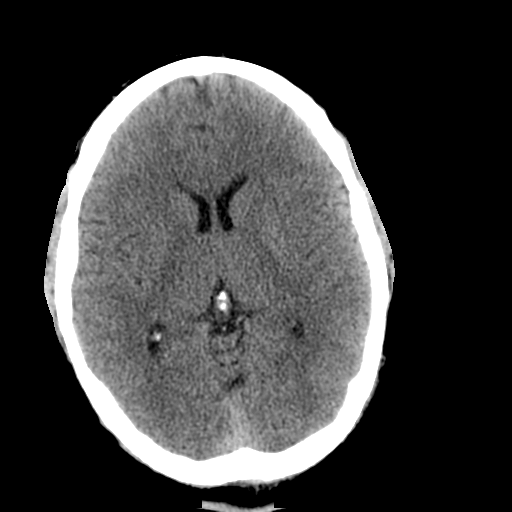
[im 17/36  brain]
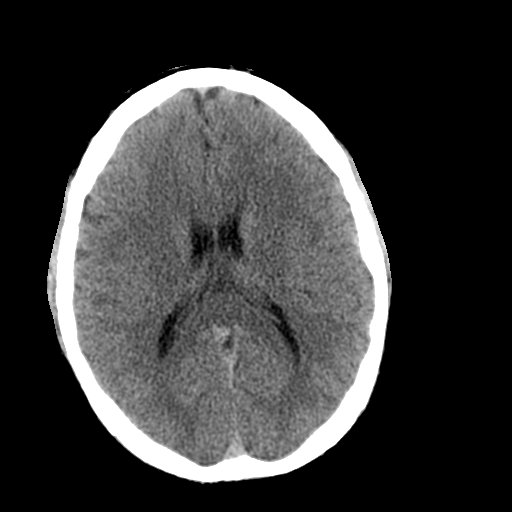
[im 19/36  brain]
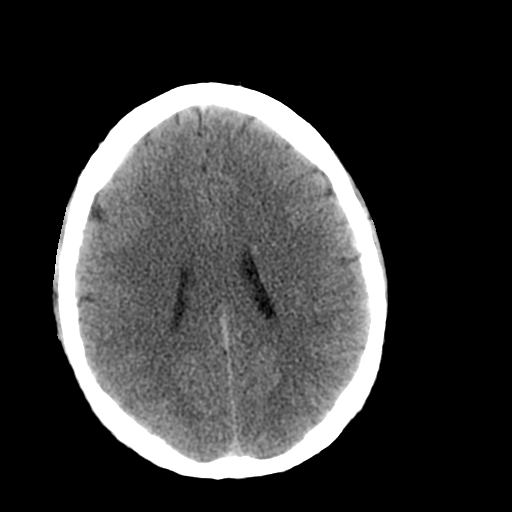
[im 19/36  bone]
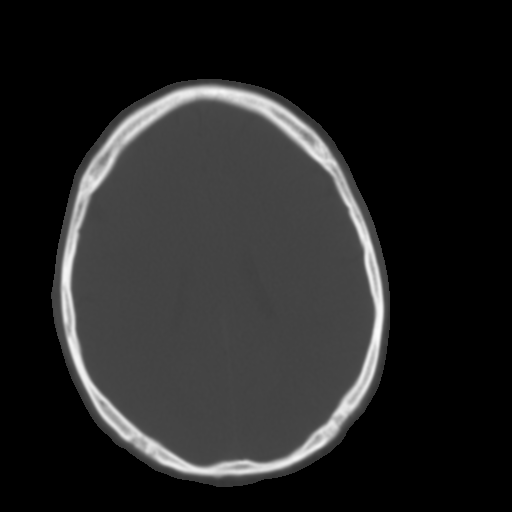
[im 21/36  brain]
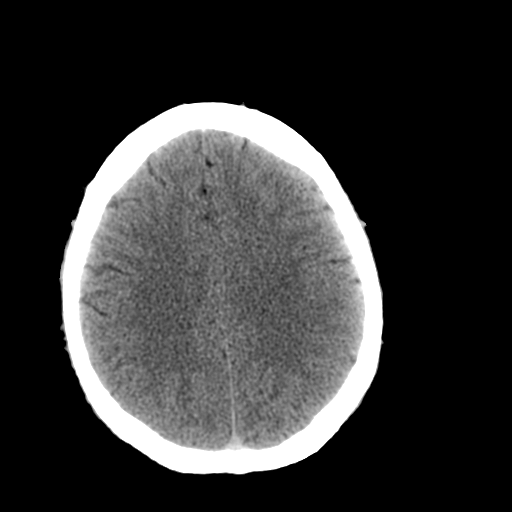
[im 23/36  brain]
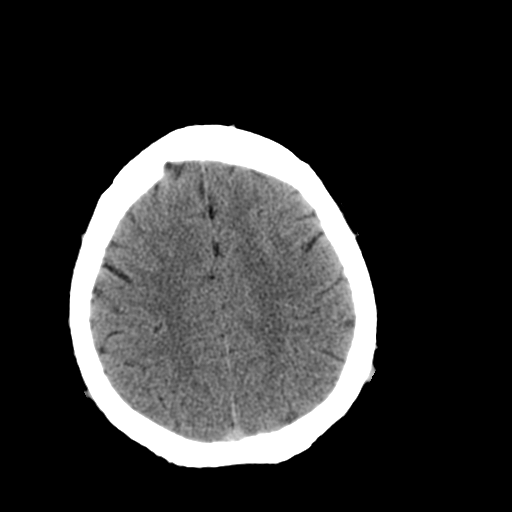
[im 26/36  brain]
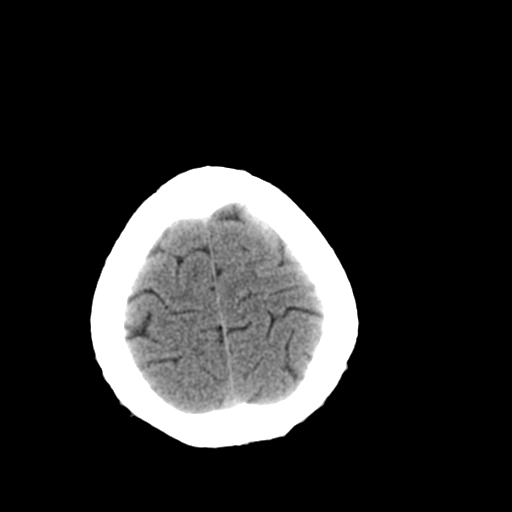
[im 27/36  brain]
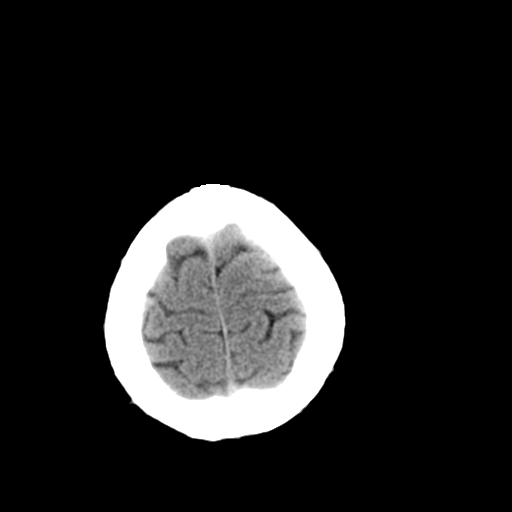
[im 27/36  bone]
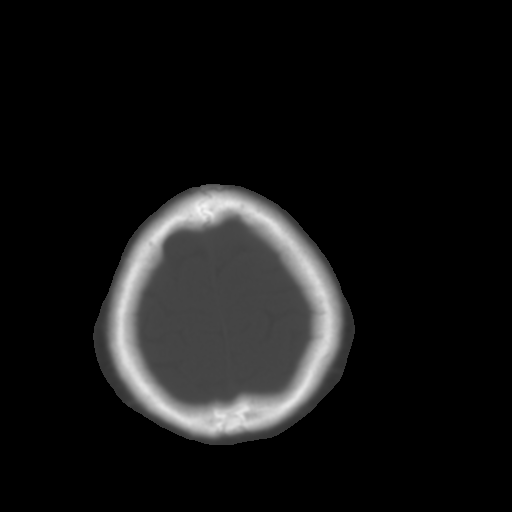
[im 29/36  brain]
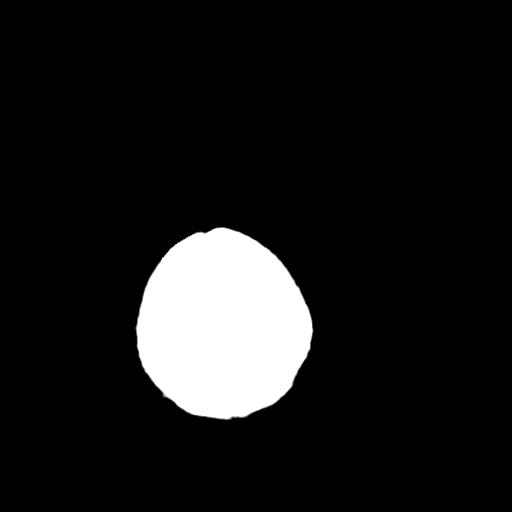
[im 32/36  brain]
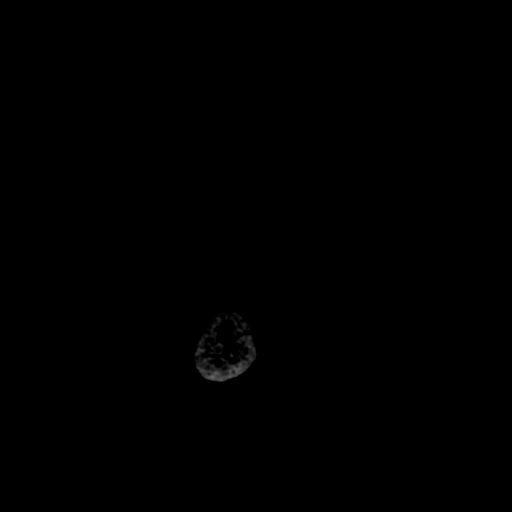
[im 34/36  brain]
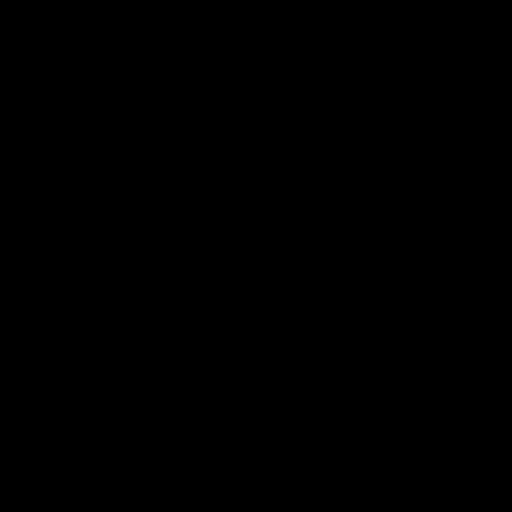

[16 of 30 positions shown; findings below may reference images not displayed]

FINDINGS: No acute intracranial abnormalities are identified,
including mass lesion or mass effect, hydrocephalus, extra-axial
fluid collection, midline shift, hemorrhage, or acute infarction.

No acute bony abnormalities are present..
IMPRESSION: No acute intracranial abnormality.

## 2014-07-01 DIAGNOSIS — M545 Low back pain: Secondary | ICD-10-CM | POA: Diagnosis not present

## 2014-07-01 DIAGNOSIS — J45998 Other asthma: Secondary | ICD-10-CM | POA: Diagnosis not present

## 2014-07-01 DIAGNOSIS — I10 Essential (primary) hypertension: Secondary | ICD-10-CM | POA: Diagnosis not present

## 2014-07-01 DIAGNOSIS — E1165 Type 2 diabetes mellitus with hyperglycemia: Secondary | ICD-10-CM | POA: Diagnosis not present

## 2014-08-23 ENCOUNTER — Encounter (HOSPITAL_COMMUNITY): Payer: Self-pay | Admitting: Emergency Medicine

## 2014-08-23 ENCOUNTER — Emergency Department (HOSPITAL_COMMUNITY): Payer: Medicare Other

## 2014-08-23 ENCOUNTER — Emergency Department (HOSPITAL_COMMUNITY)
Admission: EM | Admit: 2014-08-23 | Discharge: 2014-08-23 | Disposition: A | Discharge status: Home / Self Care | Payer: Medicare Other | Attending: Emergency Medicine | Admitting: Emergency Medicine

## 2014-08-23 DIAGNOSIS — S060X0A Concussion without loss of consciousness, initial encounter: Secondary | ICD-10-CM

## 2014-08-23 DIAGNOSIS — I2510 Atherosclerotic heart disease of native coronary artery without angina pectoris: Secondary | ICD-10-CM | POA: Insufficient documentation

## 2014-08-23 DIAGNOSIS — Y998 Other external cause status: Secondary | ICD-10-CM | POA: Insufficient documentation

## 2014-08-23 DIAGNOSIS — E119 Type 2 diabetes mellitus without complications: Secondary | ICD-10-CM | POA: Diagnosis not present

## 2014-08-23 DIAGNOSIS — Z880 Allergy status to penicillin: Secondary | ICD-10-CM | POA: Diagnosis not present

## 2014-08-23 DIAGNOSIS — Y9389 Activity, other specified: Secondary | ICD-10-CM | POA: Insufficient documentation

## 2014-08-23 DIAGNOSIS — J45909 Unspecified asthma, uncomplicated: Secondary | ICD-10-CM | POA: Diagnosis not present

## 2014-08-23 DIAGNOSIS — Z720 Tobacco use: Secondary | ICD-10-CM | POA: Diagnosis not present

## 2014-08-23 DIAGNOSIS — I10 Essential (primary) hypertension: Secondary | ICD-10-CM | POA: Insufficient documentation

## 2014-08-23 DIAGNOSIS — S0181XA Laceration without foreign body of other part of head, initial encounter: Secondary | ICD-10-CM | POA: Diagnosis not present

## 2014-08-23 DIAGNOSIS — G40909 Epilepsy, unspecified, not intractable, without status epilepticus: Secondary | ICD-10-CM | POA: Insufficient documentation

## 2014-08-23 DIAGNOSIS — W208XXA Other cause of strike by thrown, projected or falling object, initial encounter: Secondary | ICD-10-CM | POA: Insufficient documentation

## 2014-08-23 DIAGNOSIS — Z79899 Other long term (current) drug therapy: Secondary | ICD-10-CM | POA: Insufficient documentation

## 2014-08-23 DIAGNOSIS — S0990XA Unspecified injury of head, initial encounter: Secondary | ICD-10-CM | POA: Diagnosis present

## 2014-08-23 DIAGNOSIS — Y9289 Other specified places as the place of occurrence of the external cause: Secondary | ICD-10-CM | POA: Insufficient documentation

## 2014-08-23 HISTORY — DX: Type 2 diabetes mellitus without complications: E11.9

## 2014-08-23 MED ORDER — LIDOCAINE HCL (PF) 1 % IJ SOLN
INTRAMUSCULAR | Status: AC
  Filled 2014-08-23: qty 5

## 2014-08-23 MED ORDER — LIDOCAINE-EPINEPHRINE-TETRACAINE (LET) SOLUTION
NASAL | Status: AC
  Filled 2014-08-23: qty 3

## 2014-08-23 MED ORDER — LIDOCAINE-EPINEPHRINE-TETRACAINE (LET) SOLUTION
3.0000 mL | Freq: Once | NASAL | Status: AC
  Administered 2014-08-23: 3 mL via TOPICAL

## 2014-08-23 NOTE — Discharge Instructions (Signed)
Take tylenol and ibuprofen as needed for pain. Apply ice to the area. Follow up to have the sutures out in 5 days. Return sooner for any problems. You can go to your doctor, Urgent Care or here for suture removal.

## 2014-08-23 NOTE — ED Notes (Signed)
Called name to be seen .  No answer.

## 2014-08-23 NOTE — ED Notes (Signed)
Patient states he hit his forehead with a wrench. Patient has approximately 1" laceration noted above left eye. Bleeding controlled at triage.

## 2014-08-23 NOTE — ED Provider Notes (Signed)
CSN: [id number]     Arrival date & time 08/23/14  1402 History   First MD Initiated Contact with Patient 08/23/14 1613     Chief Complaint  Patient presents with  . Head Injury     (Consider location/radiation/quality/duration/timing/severity/associated sxs/prior Treatment) Patient is a 34 y.o. male presenting with skin laceration. The history is provided by the patient.  Laceration Location:  Face Facial laceration location:  Forehead Depth:  Through underlying tissue Bleeding: controlled with pressure   Time since incident:  3 hours Injury mechanism: wench slipped. Pain details:    Quality:  Throbbing   Severity:  Moderate   Timing:  Constant   Progression:  Unchanged Foreign body present:  Unable to specify Relieved by:  Nothing Worsened by:  Pressure Ineffective treatments:  None tried Tetanus status:  Up to date  Chad Hampton is a 34 y.o. male who presents to the ED with a laceration to the forehead and headache. He states that he was working on the transmission of his car and the wrench slipped and flew back and hit him on the right forehead. He had bleeding and continues to have pain. The bleeding has stopped with pressure. He denies visual changes, he does not think he had LOC. He has had nausea since the accident but has not vomited.   Past Medical History  Diagnosis Date  . Seizure disorder   . Asthma   . Hypertension   . Acute angina   . CAD (coronary artery disease)   . Seizures   . Diabetes mellitus without complication    Past Surgical History  Procedure Laterality Date  . Cholecystectomy    . Eye surgery    . Facial reconstruction surgery     Family History  Problem Relation Age of Onset  . Diabetes Mother   . Hypertension Mother   . Heart failure Father   . Stroke Father   . Diabetes Father   . Heart failure Brother    History  Substance Use Topics  . Smoking status: Current Every Day Smoker -- 0.50 packs/day for 20 years    Types:  Cigarettes  . Smokeless tobacco: Never Used  . Alcohol Use: Yes     Comment: occasionally    Review of Systems Negative except as stated in HPI   Allergies  Morphine and related; Penicillins; Tramadol; and Ibuprofen  Home Medications   Prior to Admission medications   Medication Sig Start Date End Date Taking? Authorizing Provider  albuterol (PROVENTIL) (2.5 MG/3ML) 0.083% nebulizer solution Take 3 mLs (2.5 mg total) by nebulization every 6 (six) hours as needed for wheezing. 09/27/12   [place], NP  benazepril-hydrochlorthiazide (LOTENSIN HCT) 20-25 MG per tablet Take 1 tablet by mouth daily.    Historical Provider, MD  divalproex (DEPAKOTE ER) 500 MG 24 hr tablet Take 500 mg by mouth daily.    Historical Provider, MD  nitroGLYCERIN (NITROSTAT) 0.4 MG SL tablet Place 0.4 mg under the tongue every 5 (five) minutes as needed for chest pain.     Historical Provider, MD  phenytoin (DILANTIN) 100 MG ER capsule Take 300 mg by mouth every morning.      Historical Provider, MD   BP 138/77 mmHg  Pulse 87  Temp(Src) 98.6 F (37 C) (Oral)  Resp 18  Ht 6' (1.829 m)  Wt 275 lb (124.739 kg)  BMI 37.29 kg/m2  SpO2 97% Physical Exam  Constitutional: He is oriented to person, place, and time. He appears  well-developed and well-nourished. No distress.  HENT:  Head:    Right Ear: Tympanic membrane normal.  Left Ear: Tympanic membrane normal.  Nose: Nose normal.  Mouth/Throat: Uvula is midline, oropharynx is clear and moist and mucous membranes are normal.  Eyes: Conjunctivae and EOM are normal.  Neck: Normal range of motion. Neck supple.  Cardiovascular: Normal rate and regular rhythm.   Pulmonary/Chest: Effort normal. He has no wheezes. He has no rales.  Abdominal: Soft. Bowel sounds are normal. He exhibits no mass. There is no tenderness.  Musculoskeletal: He exhibits no edema.  Radial and pedal pulses strong, adequate circulation, good touch sensation.  Neurological: He is alert  and oriented to person, place, and time. He has normal strength. No cranial nerve deficit or sensory deficit. He displays a negative Romberg sign. Gait normal.  Reflex Scores:      Bicep reflexes are 2+ on the right side and 2+ on the left side.      Brachioradialis reflexes are 2+ on the right side and 2+ on the left side.      Patellar reflexes are 2+ on the right side and 2+ on the left side.      Achilles reflexes are 2+ on the right side and 2+ on the left side. Rapid alternating movement without difficulty. Stands on one foot without difficulty.  Skin: Skin is warm and dry.  Psychiatric: He has a normal mood and affect. His behavior is normal.    ED Course  LACERATION REPAIR Date/Time: 08/23/2014 5:53 PM Performed by: Ashley Murrain Authorized by: Ashley Murrain Consent: Verbal consent obtained. Risks and benefits: risks, benefits and alternatives were discussed Consent given by: patient Patient understanding: patient states understanding of the procedure being performed Required items: required blood products, implants, devices, and special equipment available Patient identity confirmed: verbally with patient Body area: head/neck Location details: forehead Laceration length: 1.5 cm Foreign bodies: no foreign bodies Tendon involvement: none Nerve involvement: none Vascular damage: no Anesthesia: local infiltration Local anesthetic: lidocaine 1% without epinephrine Anesthetic total: 3 ml Patient sedated: no Preparation: Patient was prepped and draped in the usual sterile fashion. Irrigation solution: saline Irrigation method: syringe Amount of cleaning: standard Debridement: none Degree of undermining: none Skin closure: 5-0 Prolene Number of sutures: 4 Technique: simple Approximation: close Approximation difficulty: simple Dressing: pressure dressing Comments: Ice pack to the area   (including critical care time)  Labs Review Labs Reviewed - No data to  display  Imaging Review Ct Head Wo Contrast  08/23/2014   CLINICAL DATA:  Left superior orbital laceration, head injury, trauma  EXAM: CT HEAD WITHOUT CONTRAST  TECHNIQUE: Contiguous axial images were obtained from the base of the skull through the vertex without contrast.  COMPARISON:  01/23/2013  FINDINGS: Normal appearance of the intracranial structures. No evidence for acute hemorrhage, mass lesion, midline shift, hydrocephalus or large infarct. No acute bony abnormality. The visualized sinuses are clear. Left anterior frontal scalp injury noted with overlying dressing. No underlying fracture or acute osseous finding.  IMPRESSION: No acute intracranial abnormality.   Electronically Signed   By: Jerilynn Mages.  Shick M.D.   On: 08/23/2014 17:32     MDM  34 y.o. male with laceration of the forehead, contusion of the forehead , nausea s/p injury. Stable for d/c without focal neuro deficits and no acute findings on CT. Discussed with the patient and his family findings and plan of care. All questioned fully answered. He will return if any problems arise.  Final diagnoses:  Laceration of face, initial encounter       [facility], NP 08/23/14 1825  Milton Ferguson, MD 08/23/14 2245

## 2014-09-05 ENCOUNTER — Encounter (HOSPITAL_COMMUNITY): Payer: Self-pay | Admitting: Emergency Medicine

## 2014-09-05 DIAGNOSIS — Z79899 Other long term (current) drug therapy: Secondary | ICD-10-CM | POA: Diagnosis not present

## 2014-09-05 DIAGNOSIS — Z880 Allergy status to penicillin: Secondary | ICD-10-CM | POA: Insufficient documentation

## 2014-09-05 DIAGNOSIS — Z720 Tobacco use: Secondary | ICD-10-CM | POA: Diagnosis not present

## 2014-09-05 DIAGNOSIS — I2510 Atherosclerotic heart disease of native coronary artery without angina pectoris: Secondary | ICD-10-CM | POA: Insufficient documentation

## 2014-09-05 DIAGNOSIS — E119 Type 2 diabetes mellitus without complications: Secondary | ICD-10-CM | POA: Diagnosis not present

## 2014-09-05 DIAGNOSIS — J45909 Unspecified asthma, uncomplicated: Secondary | ICD-10-CM | POA: Diagnosis not present

## 2014-09-05 DIAGNOSIS — I10 Essential (primary) hypertension: Secondary | ICD-10-CM | POA: Diagnosis not present

## 2014-09-05 DIAGNOSIS — H5711 Ocular pain, right eye: Secondary | ICD-10-CM | POA: Diagnosis present

## 2014-09-05 DIAGNOSIS — H00012 Hordeolum externum right lower eyelid: Secondary | ICD-10-CM | POA: Diagnosis not present

## 2014-09-05 DIAGNOSIS — G40909 Epilepsy, unspecified, not intractable, without status epilepticus: Secondary | ICD-10-CM | POA: Diagnosis not present

## 2014-09-05 DIAGNOSIS — H6121 Impacted cerumen, right ear: Secondary | ICD-10-CM | POA: Diagnosis not present

## 2014-09-05 NOTE — ED Notes (Signed)
Pt c/o rt eye swelling and rt ear pain x 2 days.

## 2014-09-06 ENCOUNTER — Emergency Department (HOSPITAL_COMMUNITY)
Admission: EM | Admit: 2014-09-06 | Discharge: 2014-09-06 | Disposition: A | Discharge status: Home / Self Care | Payer: Medicare Other | Attending: Emergency Medicine | Admitting: Emergency Medicine

## 2014-09-06 DIAGNOSIS — H6121 Impacted cerumen, right ear: Secondary | ICD-10-CM

## 2014-09-06 DIAGNOSIS — H00012 Hordeolum externum right lower eyelid: Secondary | ICD-10-CM

## 2014-09-06 MED ORDER — HYDROGEN PEROXIDE 3 % EX SOLN
CUTANEOUS | Status: AC
  Filled 2014-09-06: qty 473

## 2014-09-06 MED ORDER — CARBAMIDE PEROXIDE 6.5 % OT SOLN
5.0000 [drp] | Freq: Once | OTIC | Status: AC
  Administered 2014-09-06: 5 [drp] via OTIC
  Filled 2014-09-06: qty 15

## 2014-09-06 MED ORDER — ERYTHROMYCIN 5 MG/GM OP OINT
TOPICAL_OINTMENT | Freq: Once | OPHTHALMIC | Status: AC
  Administered 2014-09-06: 01:00:00 via OPHTHALMIC
  Filled 2014-09-06: qty 3.5

## 2014-09-06 NOTE — ED Provider Notes (Signed)
CSN: [id number]     Arrival date & time 09/05/14  2216 History   First MD Initiated Contact with Patient 09/06/14 0049     Chief Complaint  Patient presents with  . Eye Pain     (Consider location/radiation/quality/duration/timing/severity/associated sxs/prior Treatment) HPI   Chad Hampton is a 34 y.o. male who presents to the Emergency Department complaining of right ear pain and right lower eye lid pain.  Symptoms began 2 days ago.  He states that he noticed swelling and tenderness to lower lid with increased tearing of his eye.  He denies dizziness, visual changes, fever, drainage of his eye, trauma or contact use.  He also c/o pain and decreased hearing to the right ear.  States he feels a "pressure" to his ear.  He has not tried any medications or other therapies. Denies cough, nasal congestion or sore throat.     Past Medical History  Diagnosis Date  . Seizure disorder   . Asthma   . Hypertension   . Acute angina   . CAD (coronary artery disease)   . Seizures   . Diabetes mellitus without complication    Past Surgical History  Procedure Laterality Date  . Cholecystectomy    . Eye surgery    . Facial reconstruction surgery     Family History  Problem Relation Age of Onset  . Diabetes Mother   . Hypertension Mother   . Heart failure Father   . Stroke Father   . Diabetes Father   . Heart failure Brother    History  Substance Use Topics  . Smoking status: Current Every Day Smoker -- 0.50 packs/day for 20 years    Types: Cigarettes  . Smokeless tobacco: Never Used  . Alcohol Use: Yes     Comment: occasionally    Review of Systems  Constitutional: Negative for fever, chills and activity change.  HENT: Positive for ear pain. Negative for congestion, dental problem, ear discharge, facial swelling, rhinorrhea, sore throat and trouble swallowing.        Decreased hearing right ear  Eyes: Positive for pain. Negative for photophobia, discharge, redness, itching and  visual disturbance.  Respiratory: Negative for shortness of breath.   Gastrointestinal: Negative for nausea and vomiting.  Skin: Negative for rash.  Neurological: Negative for dizziness, facial asymmetry, weakness, numbness and headaches.  All other systems reviewed and are negative.     Allergies  Morphine and related; Penicillins; Tramadol; and Ibuprofen  Home Medications   Prior to Admission medications   Medication Sig Start Date End Date Taking? Authorizing Provider  albuterol (PROVENTIL) (2.5 MG/3ML) 0.083% nebulizer solution Take 3 mLs (2.5 mg total) by nebulization every 6 (six) hours as needed for wheezing. 09/27/12  Yes [place], NP  benazepril-hydrochlorthiazide (LOTENSIN HCT) 20-25 MG per tablet Take 1 tablet by mouth daily.   Yes Historical Provider, MD  divalproex (DEPAKOTE ER) 500 MG 24 hr tablet Take 500 mg by mouth daily.   Yes Historical Provider, MD  nitroGLYCERIN (NITROSTAT) 0.4 MG SL tablet Place 0.4 mg under the tongue every 5 (five) minutes as needed for chest pain.    Yes Historical Provider, MD  phenytoin (DILANTIN) 100 MG ER capsule Take 300 mg by mouth every morning.     Yes Historical Provider, MD   BP 125/79 mmHg  Pulse 98  Temp(Src) 98.2 F (36.8 C) (Oral)  Resp 20  Ht 6' (1.829 m)  Wt 275 lb (124.739 kg)  BMI 37.29 kg/m2  SpO2 99% Physical Exam  Constitutional: He is oriented to person, place, and time. He appears well-developed and well-nourished. No distress.  HENT:  Head: Normocephalic and atraumatic.  Right Ear: No mastoid tenderness.  Left Ear: Tympanic membrane and ear canal normal.  Mouth/Throat: Oropharynx is clear and moist.  Cerumen impaction.  Unable to visualize the TM  Eyes: Conjunctivae and EOM are normal. Pupils are equal, round, and reactive to light. Lids are everted and swept, no foreign bodies found. Right eye exhibits hordeolum. Right conjunctiva is not injected. Right conjunctiva has no hemorrhage.    Neck: Normal range  of motion. Neck supple.  Cardiovascular: Normal rate, regular rhythm and normal heart sounds.   No murmur heard. Pulmonary/Chest: Effort normal and breath sounds normal. No respiratory distress.  Musculoskeletal: Normal range of motion.  Lymphadenopathy:    He has no cervical adenopathy.  Neurological: He is alert and oriented to person, place, and time. He exhibits normal muscle tone. Coordination normal.  Skin: Skin is warm and dry.  Psychiatric: He has a normal mood and affect.    ED Course  Procedures (including critical care time) Labs Review Labs Reviewed - No data to display  Imaging Review No results found.   EKG Interpretation None      MDM   Final diagnoses:  Hordeolum externum of right lower eyelid  Cerumen impaction, right   Pt well appearing.  Erythromycin oint dispensed and right ear irrigated to remove cerumen.  Pt stable for d/c.  Agrees to care plan and f/u with his PMD if needed.       Kem Parkinson, PA-C 09/06/14 [place], MD 09/10/14 2259

## 2014-09-06 NOTE — Discharge Instructions (Signed)

## 2014-09-23 DIAGNOSIS — R569 Unspecified convulsions: Secondary | ICD-10-CM | POA: Diagnosis not present

## 2014-10-07 DIAGNOSIS — Z720 Tobacco use: Secondary | ICD-10-CM | POA: Insufficient documentation

## 2014-10-07 DIAGNOSIS — E119 Type 2 diabetes mellitus without complications: Secondary | ICD-10-CM | POA: Diagnosis not present

## 2014-10-07 DIAGNOSIS — G40909 Epilepsy, unspecified, not intractable, without status epilepticus: Secondary | ICD-10-CM | POA: Diagnosis not present

## 2014-10-07 DIAGNOSIS — S39012A Strain of muscle, fascia and tendon of lower back, initial encounter: Secondary | ICD-10-CM | POA: Insufficient documentation

## 2014-10-07 DIAGNOSIS — J45909 Unspecified asthma, uncomplicated: Secondary | ICD-10-CM | POA: Insufficient documentation

## 2014-10-07 DIAGNOSIS — I25119 Atherosclerotic heart disease of native coronary artery with unspecified angina pectoris: Secondary | ICD-10-CM | POA: Diagnosis not present

## 2014-10-07 DIAGNOSIS — Y92410 Unspecified street and highway as the place of occurrence of the external cause: Secondary | ICD-10-CM | POA: Insufficient documentation

## 2014-10-07 DIAGNOSIS — Z79899 Other long term (current) drug therapy: Secondary | ICD-10-CM | POA: Diagnosis not present

## 2014-10-07 DIAGNOSIS — M25511 Pain in right shoulder: Secondary | ICD-10-CM | POA: Diagnosis not present

## 2014-10-07 DIAGNOSIS — Z880 Allergy status to penicillin: Secondary | ICD-10-CM | POA: Insufficient documentation

## 2014-10-07 DIAGNOSIS — Y998 Other external cause status: Secondary | ICD-10-CM | POA: Insufficient documentation

## 2014-10-07 DIAGNOSIS — I10 Essential (primary) hypertension: Secondary | ICD-10-CM | POA: Diagnosis not present

## 2014-10-07 DIAGNOSIS — S3992XA Unspecified injury of lower back, initial encounter: Secondary | ICD-10-CM | POA: Diagnosis not present

## 2014-10-07 DIAGNOSIS — S43401A Unspecified sprain of right shoulder joint, initial encounter: Secondary | ICD-10-CM | POA: Insufficient documentation

## 2014-10-07 DIAGNOSIS — Y9389 Activity, other specified: Secondary | ICD-10-CM | POA: Insufficient documentation

## 2014-10-07 DIAGNOSIS — M545 Low back pain: Secondary | ICD-10-CM | POA: Diagnosis not present

## 2014-10-08 ENCOUNTER — Emergency Department (HOSPITAL_COMMUNITY): Payer: Medicare Other

## 2014-10-08 ENCOUNTER — Encounter (HOSPITAL_COMMUNITY): Payer: Self-pay

## 2014-10-08 ENCOUNTER — Emergency Department (HOSPITAL_COMMUNITY)
Admission: EM | Admit: 2014-10-08 | Discharge: 2014-10-08 | Disposition: A | Discharge status: Home / Self Care | Payer: Medicare Other | Attending: Emergency Medicine | Admitting: Emergency Medicine

## 2014-10-08 DIAGNOSIS — M545 Low back pain: Secondary | ICD-10-CM | POA: Diagnosis not present

## 2014-10-08 DIAGNOSIS — S3992XA Unspecified injury of lower back, initial encounter: Secondary | ICD-10-CM | POA: Diagnosis not present

## 2014-10-08 DIAGNOSIS — S43401A Unspecified sprain of right shoulder joint, initial encounter: Secondary | ICD-10-CM

## 2014-10-08 DIAGNOSIS — M25511 Pain in right shoulder: Secondary | ICD-10-CM | POA: Diagnosis not present

## 2014-10-08 DIAGNOSIS — S39012A Strain of muscle, fascia and tendon of lower back, initial encounter: Secondary | ICD-10-CM | POA: Diagnosis not present

## 2014-10-08 MED ORDER — CYCLOBENZAPRINE HCL 10 MG PO TABS: 10.0000 mg | ORAL_TABLET | Freq: Three times a day (TID) | ORAL | Status: DC | PRN

## 2014-10-08 MED ORDER — HYDROCODONE-ACETAMINOPHEN 5-325 MG PO TABS
1.0000 | ORAL_TABLET | Freq: Once | ORAL | Status: AC
  Administered 2014-10-08: 1 via ORAL
  Filled 2014-10-08: qty 1

## 2014-10-08 MED ORDER — CYCLOBENZAPRINE HCL 10 MG PO TABS
10.0000 mg | ORAL_TABLET | Freq: Once | ORAL | Status: AC
  Administered 2014-10-08: 10 mg via ORAL
  Filled 2014-10-08: qty 1

## 2014-10-08 MED ORDER — HYDROCODONE-ACETAMINOPHEN 5-325 MG PO TABS: ORAL_TABLET | ORAL | Status: DC
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
## 2014-10-08 NOTE — Discharge Instructions (Signed)

## 2014-11-23 ENCOUNTER — Encounter (HOSPITAL_COMMUNITY): Payer: Self-pay | Admitting: *Deleted

## 2014-11-23 ENCOUNTER — Emergency Department (HOSPITAL_COMMUNITY)
Admission: EM | Admit: 2014-11-23 | Discharge: 2014-11-23 | Disposition: A | Discharge status: Home / Self Care | Payer: Medicare Other | Attending: Emergency Medicine | Admitting: Emergency Medicine

## 2014-11-23 DIAGNOSIS — E119 Type 2 diabetes mellitus without complications: Secondary | ICD-10-CM | POA: Insufficient documentation

## 2014-11-23 DIAGNOSIS — Y929 Unspecified place or not applicable: Secondary | ICD-10-CM | POA: Insufficient documentation

## 2014-11-23 DIAGNOSIS — T63461A Toxic effect of venom of wasps, accidental (unintentional), initial encounter: Secondary | ICD-10-CM | POA: Diagnosis not present

## 2014-11-23 DIAGNOSIS — T63441A Toxic effect of venom of bees, accidental (unintentional), initial encounter: Secondary | ICD-10-CM

## 2014-11-23 DIAGNOSIS — I25119 Atherosclerotic heart disease of native coronary artery with unspecified angina pectoris: Secondary | ICD-10-CM | POA: Diagnosis not present

## 2014-11-23 DIAGNOSIS — Z720 Tobacco use: Secondary | ICD-10-CM | POA: Diagnosis not present

## 2014-11-23 DIAGNOSIS — I10 Essential (primary) hypertension: Secondary | ICD-10-CM | POA: Diagnosis not present

## 2014-11-23 DIAGNOSIS — R112 Nausea with vomiting, unspecified: Secondary | ICD-10-CM | POA: Insufficient documentation

## 2014-11-23 DIAGNOSIS — Y939 Activity, unspecified: Secondary | ICD-10-CM | POA: Insufficient documentation

## 2014-11-23 DIAGNOSIS — G40909 Epilepsy, unspecified, not intractable, without status epilepticus: Secondary | ICD-10-CM | POA: Diagnosis not present

## 2014-11-23 DIAGNOSIS — Z880 Allergy status to penicillin: Secondary | ICD-10-CM | POA: Insufficient documentation

## 2014-11-23 DIAGNOSIS — J45909 Unspecified asthma, uncomplicated: Secondary | ICD-10-CM | POA: Insufficient documentation

## 2014-11-23 DIAGNOSIS — Y999 Unspecified external cause status: Secondary | ICD-10-CM | POA: Insufficient documentation

## 2014-11-23 DIAGNOSIS — Z79899 Other long term (current) drug therapy: Secondary | ICD-10-CM | POA: Diagnosis not present

## 2014-11-23 MED ORDER — OXYCODONE-ACETAMINOPHEN 5-325 MG PO TABS: 1.0000 | ORAL_TABLET | ORAL | Status: DC | PRN

## 2014-11-23 MED ORDER — OXYCODONE-ACETAMINOPHEN 5-325 MG PO TABS
1.0000 | ORAL_TABLET | Freq: Once | ORAL | Status: AC
  Administered 2014-11-23: 1 via ORAL
  Filled 2014-11-23: qty 1

## 2014-11-23 MED ORDER — DEXAMETHASONE 4 MG PO TABS
12.0000 mg | ORAL_TABLET | Freq: Once | ORAL | Status: AC
  Administered 2014-11-23: 12 mg via ORAL
  Filled 2014-11-23: qty 3

## 2014-11-23 NOTE — ED Notes (Signed)
Patient verbalizes understanding of discharge instructions, prescription medications, home care and follow up care if needed. Patient ambulatory out of department at this time with friend

## 2014-11-23 NOTE — ED Notes (Signed)
Pt c/o bee sting to right hand on thumb about 4-5 hours earlier, c/o pain to hand,

## 2014-11-23 NOTE — ED Provider Notes (Signed)
CSN: [id number]     Arrival date & time 11/23/14  0038 History   First MD Initiated Contact with Patient 11/23/14 0107     Chief Complaint  Patient presents with  . Insect Bite     (Consider location/radiation/quality/duration/timing/severity/associated sxs/prior Treatment) The history is provided by the patient.   34 year old male was stung by a yellow jacket. The stinging is on the flexor surface of the right wrist on the radial side. Is complaining of pain and swelling and states that he cannot bend his wrist. He rates pain at 6/10. He did have nausea and vomiting following the sting but he denies any difficulty breathing and denies any generalized rash.  Past Medical History  Diagnosis Date  . Seizure disorder   . Asthma   . Hypertension   . Acute angina   . CAD (coronary artery disease)   . Seizures   . Diabetes mellitus without complication    Past Surgical History  Procedure Laterality Date  . Cholecystectomy    . Eye surgery    . Facial reconstruction surgery     Family History  Problem Relation Age of Onset  . Diabetes Mother   . Hypertension Mother   . Heart failure Father   . Stroke Father   . Diabetes Father   . Heart failure Brother    History  Substance Use Topics  . Smoking status: Current Every Day Smoker -- 0.50 packs/day for 20 years    Types: Cigarettes  . Smokeless tobacco: Never Used  . Alcohol Use: Yes     Comment: occasionally    Review of Systems  All other systems reviewed and are negative.     Allergies  Morphine and related; Penicillins; Tramadol; and Ibuprofen  Home Medications   Prior to Admission medications   Medication Sig Start Date End Date Taking? Authorizing Provider  albuterol (PROVENTIL) (2.5 MG/3ML) 0.083% nebulizer solution Take 3 mLs (2.5 mg total) by nebulization every 6 (six) hours as needed for wheezing. 09/27/12   [place], NP  benazepril-hydrochlorthiazide (LOTENSIN HCT) 20-25 MG per tablet Take 1 tablet by  mouth daily.    Historical Provider, MD  cyclobenzaprine (FLEXERIL) 10 MG tablet Take 1 tablet (10 mg total) by mouth 3 (three) times daily as needed. 10/08/14   Tammy Triplett, PA-C  divalproex (DEPAKOTE ER) 500 MG 24 hr tablet Take 500 mg by mouth daily.    Historical Provider, MD  HYDROcodone-acetaminophen (NORCO/VICODIN) 5-325 MG per tablet Take one-two tabs po q 4-6 hrs prn pain 10/08/14   Tammy Triplett, PA-C  nitroGLYCERIN (NITROSTAT) 0.4 MG SL tablet Place 0.4 mg under the tongue every 5 (five) minutes as needed for chest pain.     Historical Provider, MD  phenytoin (DILANTIN) 100 MG ER capsule Take 300 mg by mouth every morning.      Historical Provider, MD   BP 154/89 mmHg  Pulse 80  Temp(Src) 98.3 F (36.8 C) (Oral)  Resp 20  Ht 6' (1.829 m)  Wt 275 lb (124.739 kg)  BMI 37.29 kg/m2  SpO2 100% Physical Exam  Nursing note and vitals reviewed.  34 year old male, resting comfortably and in no acute distress. Vital signs are  significant for hypertension. Oxygen saturation is 100%, which is normal. Head is normocephalic and atraumatic. PERRLA, EOMI. Oropharynx is clear. Neck is nontender and supple without adenopathy or JVD. Back is nontender and there is no CVA tenderness. Lungs are clear without rales, wheezes, or rhonchi. Chest is nontender.  Heart has regular rate and rhythm without murmur. Abdomen is soft, flat, nontender without masses or hepatosplenomegaly and peristalsis is normoactive. Extremities have no cyanosis or edema, full range of motion is present. there is mild erythema and swelling over the flexor surface of the right distal forearm on the radial side consistent with local reaction to a bee sting. Minimal swelling is seen in the thenar eminence. Skin is warm and dry without rash. Neurologic: Mental status is normal, cranial nerves are intact, there are no motor or sensory deficits.  ED Course  Procedures (including critical care time)  MDM   Final diagnoses:   Bee sting, accidental or unintentional, initial encounter    Hymenoptera sting of the right wrist. Is given a dose of dexamethasone in the ED and discharged with prescription for oxycodone have acetaminophen for pain. Advised to apply ice and use over-the-counter nonsedating any histamines as needed for any itching that occurs.    [name], MD 37/10/62 6948

## 2014-11-23 NOTE — Discharge Instructions (Signed)
Apply ice several times a day. Take Claritin or Zyrtec once a day. Take diphenhydramine (Benadryl) for any itching not relieved by Claritin/Zyrtec.  Bee, Wasp, or Hornet Sting Your caregiver has diagnosed you as having an insect sting. An insect sting appears as a red lump in the skin that sometimes has a tiny hole in the center, or it may have a stinger in the center of the wound. The most common stings are from wasps, hornets and bees. Individuals have different reactions to insect stings.  A normal reaction may cause pain, swelling, and redness around the sting site.  A localized allergic reaction may cause swelling and redness that extends beyond the sting site.  A large local reaction may continue to develop over the next 12 to 36 hours.  On occasion, the reactions can be severe (anaphylactic reaction). An anaphylactic reaction may cause wheezing; difficulty breathing; chest pain; fainting; raised, itchy, red patches on the skin; a sick feeling to your stomach (nausea); vomiting; cramping; or diarrhea. If you have had an anaphylactic reaction to an insect sting in the past, you are more likely to have one again. HOME CARE INSTRUCTIONS   With bee stings, a small sac of poison is left in the wound. Brushing across this with something such as a credit card, or anything similar, will help remove this and decrease the amount of the reaction. This same procedure will not help a wasp sting as they do not leave behind a stinger and poison sac.  Apply a cold compress for 10 to 20 minutes every hour for 1 to 2 days, depending on severity, to reduce swelling and itching.  To lessen pain, a paste made of water and baking soda may be rubbed on the bite or sting and left on for 5 minutes.  To relieve itching and swelling, you may use take medication or apply medicated creams or lotions as directed.  Only take over-the-counter or prescription medicines for pain, discomfort, or fever as directed by your  caregiver.  Wash the sting site daily with soap and water. Apply antibiotic ointment on the sting site as directed.  If you suffered a severe reaction:  If you did not require hospitalization, an adult will need to stay with you for 24 hours in case the symptoms return.  You may need to wear a medical bracelet or necklace stating the allergy.  You and your family need to learn when and how to use an anaphylaxis kit or epinephrine injection.  If you have had a severe reaction before, always carry your anaphylaxis kit with you. SEEK MEDICAL CARE IF:   None of the above helps within 2 to 3 days.  The area becomes red, warm, tender, and swollen beyond the area of the bite or sting.  You have an oral temperature above 102 F (38.9 C). SEEK IMMEDIATE MEDICAL CARE IF:  You have symptoms of an allergic reaction which are:  Wheezing.  Difficulty breathing.  Chest pain.  Lightheadedness or fainting.  Itchy, raised, red patches on the skin.  Nausea, vomiting, cramping or diarrhea. ANY OF THESE SYMPTOMS MAY REPRESENT A SERIOUS PROBLEM THAT IS AN EMERGENCY. Do not wait to see if the symptoms will go away. Get medical help right away. Call your local emergency services (911 in [place] DO NOT drive yourself to the hospital. MAKE SURE YOU:   Understand these instructions.  Will watch your condition.  Will get help right away if you are not doing well or get worse.  Document Released: 04/15/2005 Document Revised: 07/08/2011 Document Reviewed: 09/30/2009 [facility] Patient Information 2015 [place], [place]. This information is not intended to replace advice given to you by your health care provider. Make sure you discuss any questions you have with your health care provider.  Acetaminophen; Oxycodone tablets What is this medicine? ACETAMINOPHEN; OXYCODONE (a set a MEE noe fen; ox i KOE done) is a pain reliever. It is used to treat mild to moderate pain. This medicine may be used for other  purposes; ask your health care provider or pharmacist if you have questions. COMMON BRAND NAME(S): Endocet, Magnacet, Narvox, Percocet, Perloxx, Primalev, Primlev, Roxicet, Xolox What should I tell my health care provider before I take this medicine? They need to know if you have any of these conditions: -brain tumor -Crohn's disease, inflammatory bowel disease, or ulcerative colitis -drug abuse or addiction -head injury -heart or circulation problems -if you often drink alcohol -kidney disease or problems going to the bathroom -liver disease -lung disease, asthma, or breathing problems -an unusual or allergic reaction to acetaminophen, oxycodone, other opioid analgesics, other medicines, foods, dyes, or preservatives -pregnant or trying to get pregnant -breast-feeding How should I use this medicine? Take this medicine by mouth with a full glass of water. Follow the directions on the prescription label. Take your medicine at regular intervals. Do not take your medicine more often than directed. Talk to your pediatrician regarding the use of this medicine in children. Special care may be needed. Patients over 76 years old may have a stronger reaction and need a smaller dose. Overdosage: If you think you have taken too much of this medicine contact a poison control center or emergency room at once. NOTE: This medicine is only for you. Do not share this medicine with others. What if I miss a dose? If you miss a dose, take it as soon as you can. If it is almost time for your next dose, take only that dose. Do not take double or extra doses. What may interact with this medicine? -alcohol -antihistamines -barbiturates like amobarbital, butalbital, butabarbital, methohexital, pentobarbital, phenobarbital, thiopental, and secobarbital -benztropine -drugs for bladder problems like solifenacin, trospium, oxybutynin, tolterodine, hyoscyamine, and methscopolamine -drugs for breathing problems like  ipratropium and tiotropium -drugs for certain stomach or intestine problems like propantheline, homatropine methylbromide, glycopyrrolate, atropine, belladonna, and dicyclomine -general anesthetics like etomidate, ketamine, nitrous oxide, propofol, desflurane, enflurane, halothane, isoflurane, and sevoflurane -medicines for depression, anxiety, or psychotic disturbances -medicines for sleep -muscle relaxants -naltrexone -narcotic medicines (opiates) for pain -phenothiazines like perphenazine, thioridazine, chlorpromazine, mesoridazine, fluphenazine, prochlorperazine, promazine, and trifluoperazine -scopolamine -tramadol -trihexyphenidyl This list may not describe all possible interactions. Give your health care provider a list of all the medicines, herbs, non-prescription drugs, or dietary supplements you use. Also tell them if you smoke, drink alcohol, or use illegal drugs. Some items may interact with your medicine. What should I watch for while using this medicine? Tell your doctor or health care professional if your pain does not go away, if it gets worse, or if you have new or a different type of pain. You may develop tolerance to the medicine. Tolerance means that you will need a higher dose of the medication for pain relief. Tolerance is normal and is expected if you take this medicine for a long time. Do not suddenly stop taking your medicine because you may develop a severe reaction. Your body becomes used to the medicine. This does NOT mean you are addicted. Addiction is a behavior related  to getting and using a drug for a non-medical reason. If you have pain, you have a medical reason to take pain medicine. Your doctor will tell you how much medicine to take. If your doctor wants you to stop the medicine, the dose will be slowly lowered over time to avoid any side effects. You may get drowsy or dizzy. Do not drive, use machinery, or do anything that needs mental alertness until you know  how this medicine affects you. Do not stand or sit up quickly, especially if you are an older patient. This reduces the risk of dizzy or fainting spells. Alcohol may interfere with the effect of this medicine. Avoid alcoholic drinks. There are different types of narcotic medicines (opiates) for pain. If you take more than one type at the same time, you may have more side effects. Give your health care provider a list of all medicines you use. Your doctor will tell you how much medicine to take. Do not take more medicine than directed. Call emergency for help if you have problems breathing. The medicine will cause constipation. Try to have a bowel movement at least every 2 to 3 days. If you do not have a bowel movement for 3 days, call your doctor or health care professional. Do not take Tylenol (acetaminophen) or medicines that have acetaminophen with this medicine. Too much acetaminophen can be very dangerous. Many nonprescription medicines contain acetaminophen. Always read the labels carefully to avoid taking more acetaminophen. What side effects may I notice from receiving this medicine? Side effects that you should report to your doctor or health care professional as soon as possible: -allergic reactions like skin rash, itching or hives, swelling of the face, lips, or tongue -breathing difficulties, wheezing -confusion -light headedness or fainting spells -severe stomach pain -unusually weak or tired -yellowing of the skin or the whites of the eyes Side effects that usually do not require medical attention (report to your doctor or health care professional if they continue or are bothersome): -dizziness -drowsiness -nausea -vomiting This list may not describe all possible side effects. Call your doctor for medical advice about side effects. You may report side effects to FDA at 1-800-FDA-1088. Where should I keep my medicine? Keep out of the reach of children. This medicine can be abused.  Keep your medicine in a safe place to protect it from theft. Do not share this medicine with anyone. Selling or giving away this medicine is dangerous and against the law. Store at room temperature between 20 and 25 degrees C (68 and 77 degrees F). Keep container tightly closed. Protect from light. This medicine may cause accidental overdose and death if it is taken by other adults, children, or pets. Flush any unused medicine down the toilet to reduce the chance of harm. Do not use the medicine after the expiration date. NOTE: This sheet is a summary. It may not cover all possible information. If you have questions about this medicine, talk to your doctor, pharmacist, or health care provider.  2015, [employer]. (2012-12-07 13:17:35)

## 2015-01-22 ENCOUNTER — Encounter (HOSPITAL_COMMUNITY): Payer: Self-pay | Admitting: *Deleted

## 2015-01-22 ENCOUNTER — Emergency Department (HOSPITAL_COMMUNITY): Payer: Medicare Other

## 2015-01-22 ENCOUNTER — Emergency Department (HOSPITAL_COMMUNITY)
Admission: EM | Admit: 2015-01-22 | Discharge: 2015-01-23 | Disposition: A | Discharge status: Home / Self Care | Payer: Medicare Other | Attending: Emergency Medicine | Admitting: Emergency Medicine

## 2015-01-22 DIAGNOSIS — Z720 Tobacco use: Secondary | ICD-10-CM | POA: Diagnosis not present

## 2015-01-22 DIAGNOSIS — I10 Essential (primary) hypertension: Secondary | ICD-10-CM | POA: Diagnosis not present

## 2015-01-22 DIAGNOSIS — J45909 Unspecified asthma, uncomplicated: Secondary | ICD-10-CM | POA: Insufficient documentation

## 2015-01-22 DIAGNOSIS — E119 Type 2 diabetes mellitus without complications: Secondary | ICD-10-CM | POA: Insufficient documentation

## 2015-01-22 DIAGNOSIS — W228XXA Striking against or struck by other objects, initial encounter: Secondary | ICD-10-CM | POA: Insufficient documentation

## 2015-01-22 DIAGNOSIS — S60221A Contusion of right hand, initial encounter: Secondary | ICD-10-CM | POA: Diagnosis not present

## 2015-01-22 DIAGNOSIS — G40909 Epilepsy, unspecified, not intractable, without status epilepticus: Secondary | ICD-10-CM | POA: Diagnosis not present

## 2015-01-22 DIAGNOSIS — Y9289 Other specified places as the place of occurrence of the external cause: Secondary | ICD-10-CM | POA: Diagnosis not present

## 2015-01-22 DIAGNOSIS — R209 Unspecified disturbances of skin sensation: Secondary | ICD-10-CM | POA: Diagnosis not present

## 2015-01-22 DIAGNOSIS — Y998 Other external cause status: Secondary | ICD-10-CM | POA: Diagnosis not present

## 2015-01-22 DIAGNOSIS — R202 Paresthesia of skin: Secondary | ICD-10-CM

## 2015-01-22 DIAGNOSIS — Z880 Allergy status to penicillin: Secondary | ICD-10-CM | POA: Insufficient documentation

## 2015-01-22 DIAGNOSIS — F17210 Nicotine dependence, cigarettes, uncomplicated: Secondary | ICD-10-CM | POA: Diagnosis not present

## 2015-01-22 DIAGNOSIS — I25119 Atherosclerotic heart disease of native coronary artery with unspecified angina pectoris: Secondary | ICD-10-CM | POA: Diagnosis not present

## 2015-01-22 DIAGNOSIS — Z79899 Other long term (current) drug therapy: Secondary | ICD-10-CM | POA: Insufficient documentation

## 2015-01-22 DIAGNOSIS — Y9389 Activity, other specified: Secondary | ICD-10-CM | POA: Insufficient documentation

## 2015-01-22 DIAGNOSIS — S6991XA Unspecified injury of right wrist, hand and finger(s), initial encounter: Secondary | ICD-10-CM | POA: Diagnosis present

## 2015-01-22 MED ORDER — ACETAMINOPHEN 325 MG PO TABS
650.0000 mg | ORAL_TABLET | Freq: Once | ORAL | Status: AC
  Administered 2015-01-22: 650 mg via ORAL
  Filled 2015-01-22: qty 2

## 2015-01-22 NOTE — ED Notes (Signed)
Pt hit his right hand on a bolt while putting a motor in a car today, c/o pain and numbness to right hand,

## 2015-01-22 NOTE — ED Provider Notes (Signed)
CSN: [id number]     Arrival date & time 01/22/15  2309 History  This chart was scribed for Chad Fuel, MD by Stephania Fragmin, ED Scribe. This patient was seen in room APA07/APA07 and the patient's care was started at 11:35 PM.    Chief Complaint  Patient presents with  . Hand Pain   The history is provided by the patient. No language interpreter was used.    HPI Comments: Chad Hampton is a 34 y.o. male who presents to the Emergency Department complaining of 6/10 right hand pain radiating from the palm of his hand to his right third, fourth, and fifth fingers, after striking his hand on a bolt while putting a motor in a car today. Patient also complains of associated numbness to the affected areas. He states he did not feel he needed to come to the ED today, but his brother urged him to get evaluated. Patient is a 2 PPD smoker.   Past Medical History  Diagnosis Date  . Seizure disorder   . Asthma   . Hypertension   . Acute angina   . CAD (coronary artery disease)   . Seizures   . Diabetes mellitus without complication    Past Surgical History  Procedure Laterality Date  . Cholecystectomy    . Eye surgery    . Facial reconstruction surgery     Family History  Problem Relation Age of Onset  . Diabetes Mother   . Hypertension Mother   . Heart failure Father   . Stroke Father   . Diabetes Father   . Heart failure Brother    Social History  Substance Use Topics  . Smoking status: Current Every Day Smoker -- 0.50 packs/day for 20 years    Types: Cigarettes  . Smokeless tobacco: Never Used  . Alcohol Use: Yes     Comment: occasionally    Review of Systems  Musculoskeletal: Positive for myalgias (right hand pain).  Neurological: Positive for numbness.  All other systems reviewed and are negative.     Allergies  Morphine and related; Penicillins; Tramadol; and Ibuprofen  Home Medications   Prior to Admission medications   Medication Sig Start Date End Date Taking?  Authorizing Provider  albuterol (PROVENTIL) (2.5 MG/3ML) 0.083% nebulizer solution Take 3 mLs (2.5 mg total) by nebulization every 6 (six) hours as needed for wheezing. 09/27/12  Yes [place], NP  benazepril-hydrochlorthiazide (LOTENSIN HCT) 20-25 MG per tablet Take 1 tablet by mouth daily.   Yes Historical Provider, MD  cyclobenzaprine (FLEXERIL) 10 MG tablet Take 1 tablet (10 mg total) by mouth 3 (three) times daily as needed. 10/08/14  Yes Tammy Triplett, PA-C  divalproex (DEPAKOTE ER) 500 MG 24 hr tablet Take 500 mg by mouth daily.   Yes Historical Provider, MD  nitroGLYCERIN (NITROSTAT) 0.4 MG SL tablet Place 0.4 mg under the tongue every 5 (five) minutes as needed for chest pain.    Yes Historical Provider, MD  phenytoin (DILANTIN) 100 MG ER capsule Take 300 mg by mouth every morning.     Yes Historical Provider, MD  HYDROcodone-acetaminophen (NORCO/VICODIN) 5-325 MG per tablet Take one-two tabs po q 4-6 hrs prn pain 10/08/14   Tammy Triplett, PA-C  oxyCODONE-acetaminophen (PERCOCET) 5-325 MG per tablet Take 1 tablet by mouth every 4 (four) hours as needed for moderate pain. 12/26/54   Chad Fuel, MD   BP 213/08 mmHg  Pulse 76  Temp(Src) 98.2 F (36.8 C) (Oral)  Resp 16  Ht  6' (1.829 m)  Wt 275 lb (124.739 kg)  BMI 37.29 kg/m2  SpO2 100% Physical Exam  Constitutional: He is oriented to person, place, and time. He appears well-developed and well-nourished. No distress.  HENT:  Head: Normocephalic and atraumatic.  Eyes: EOM are normal. Pupils are equal, round, and reactive to light.  Neck: Normal range of motion. Neck supple. No JVD present.  Cardiovascular: Normal rate, regular rhythm and normal heart sounds.   No murmur heard. Pulmonary/Chest: Effort normal and breath sounds normal. He has no wheezes. He has no rales. He exhibits no tenderness.  Abdominal: Soft. Bowel sounds are normal. He exhibits no distension and no mass. There is no tenderness.  Musculoskeletal: Normal range of  motion. He exhibits tenderness. He exhibits no edema.  Point tenderness over proximal right third metacarpal. No other tenderness in hand. Capillary refill is prompt. Slight decreased sensation in the right third, fourth, and fifth fingers, both dorsally and volar.  Lymphadenopathy:    He has no cervical adenopathy.  Neurological: He is alert and oriented to person, place, and time. No cranial nerve deficit. He exhibits normal muscle tone. Coordination normal.  Skin: Skin is warm and dry. No rash noted.  Psychiatric: He has a normal mood and affect. His behavior is normal. Judgment and thought content normal.  Nursing note and vitals reviewed.   ED Course  Procedures (including critical care time)  DIAGNOSTIC STUDIES: Oxygen Saturation is 100% on RA, normal by my interpretation.    COORDINATION OF CARE: 11:42 PM - Smoking cessation encouraged. Doubt fracture; however, will await radiology reading of XR. Will provide Tylenol for pain relief. Pt verbalized understanding and agreed to plan.   Imaging Review Dg Hand Complete Right  01/23/2015   CLINICAL DATA:  Hit right hand putting motor in car. Initial encounter.  EXAM: RIGHT HAND - COMPLETE 3+ VIEW  COMPARISON:  02/08/2011  FINDINGS: There is no evidence of acute fracture or dislocation. No opaque foreign body.  Noted remote fifth metacarpal base fracture with regional spurring.  IMPRESSION: No acute bony finding.   Electronically Signed   By: Monte Fantasia M.D.   On: 01/23/2015 00:04   I have personally reviewed and evaluated these images as part of my medical decision-making.  MDM   Final diagnoses:  Contusion of right hand, initial encounter  Paresthesias in right hand  Tobacco abuse    Right hand contusion. Numbness appears to be related to neural contusion. No evidence of actual nerve injury. X-rays are negative for fracture. He is placed in a wrist splint for comfort and advised to use over-the-counter analgesics as needed for  pain.   I personally performed the services described in this documentation, which was scribed in my presence. The recorded information has been reviewed and is accurate.       Chad Fuel, MD 37/85/88 [id number]

## 2015-01-23 NOTE — Discharge Instructions (Signed)
Wear splint as needed.  Contusion A contusion is a deep bruise. Contusions are the result of an injury that caused bleeding under the skin. The contusion may turn blue, purple, or yellow. Minor injuries will give you a painless contusion, but more severe contusions may stay painful and swollen for a few weeks.  CAUSES  A contusion is usually caused by a blow, trauma, or direct force to an area of the body. SYMPTOMS   Swelling and redness of the injured area.  Bruising of the injured area.  Tenderness and soreness of the injured area.  Pain. DIAGNOSIS  The diagnosis can be made by taking a history and physical exam. An X-ray, CT scan, or MRI may be needed to determine if there were any associated injuries, such as fractures. TREATMENT  Specific treatment will depend on what area of the body was injured. In general, the best treatment for a contusion is resting, icing, elevating, and applying cold compresses to the injured area. Over-the-counter medicines may also be recommended for pain control. Ask your caregiver what the best treatment is for your contusion. HOME CARE INSTRUCTIONS   Put ice on the injured area.  Put ice in a plastic bag.  Place a towel between your skin and the bag.  Leave the ice on for 15-20 minutes, 3-4 times a day, or as directed by your health care provider.  Only take over-the-counter or prescription medicines for pain, discomfort, or fever as directed by your caregiver. Your caregiver may recommend avoiding anti-inflammatory medicines (aspirin, ibuprofen, and naproxen) for 48 hours because these medicines may increase bruising.  Rest the injured area.  If possible, elevate the injured area to reduce swelling. SEEK IMMEDIATE MEDICAL CARE IF:   You have increased bruising or swelling.  You have pain that is getting worse.  Your swelling or pain is not relieved with medicines. MAKE SURE YOU:   Understand these instructions.  Will watch your  condition.  Will get help right away if you are not doing well or get worse. Document Released: 01/23/2005 Document Revised: 04/20/2013 Document Reviewed: 02/18/2011 [facility] Patient Information 2015 [place], [place]. This information is not intended to replace advice given to you by your health care provider. Make sure you discuss any questions you have with your health care provider.  Smoking Hazards Smoking cigarettes is extremely bad for your health. Tobacco smoke has over 200 known poisons in it. It contains the poisonous gases nitrogen oxide and carbon monoxide. There are over 60 chemicals in tobacco smoke that cause cancer. Some of the chemicals found in cigarette smoke include:   Cyanide.   Benzene.   Formaldehyde.   Methanol (wood alcohol).   Acetylene (fuel used in welding torches).   Ammonia.  Even smoking lightly shortens your life expectancy by several years. You can greatly reduce the risk of medical problems for you and your family by stopping now. Smoking is the most preventable cause of death and disease in our society. Within days of quitting smoking, your circulation improves, you decrease the risk of having a heart attack, and your lung capacity improves. There may be some increased phlegm in the first few days after quitting, and it may take months for your lungs to clear up completely. Quitting for 10 years reduces your risk of developing lung cancer to almost that of a nonsmoker.  WHAT ARE THE RISKS OF SMOKING? Cigarette smokers have an increased risk of many serious medical problems, including:  Lung cancer.   Lung disease (such  as pneumonia, bronchitis, and emphysema).   Heart attack and chest pain due to the heart not getting enough oxygen (angina).   Heart disease and peripheral blood vessel disease.   Hypertension.   Stroke.   Oral cancer (cancer of the lip, mouth, or voice box).   Bladder cancer.   Pancreatic cancer.   Cervical  cancer.   Pregnancy complications, including premature birth.   Stillbirths and smaller newborn babies, birth defects, and genetic damage to sperm.   Early menopause.   Lower estrogen level for women.   Infertility.   Facial wrinkles.   Blindness.   Increased risk of broken bones (fractures).   Senile dementia.   Stomach ulcers and internal bleeding.   Delayed wound healing and increased risk of complications during surgery. Because of secondhand smoke exposure, children of smokers have an increased risk of the following:   Sudden infant death syndrome (SIDS).   Respiratory infections.   Lung cancer.   Heart disease.   Ear infections.  WHY IS SMOKING ADDICTIVE? Nicotine is the chemical agent in tobacco that is capable of causing addiction or dependence. When you smoke and inhale, nicotine is absorbed rapidly into the bloodstream through your lungs. Both inhaled and noninhaled nicotine may be addictive.  WHAT ARE THE BENEFITS OF QUITTING?  There are many health benefits to quitting smoking. Some are:   The likelihood of developing cancer and heart disease decreases. Health improvements are seen almost immediately.   Blood pressure, pulse rate, and breathing patterns start returning to normal soon after quitting.   People who quit may see an improvement in their overall quality of life.  HOW DO YOU QUIT SMOKING? Smoking is an addiction with both physical and psychological effects, and longtime habits can be hard to change. Your health care provider can recommend:  Programs and community resources, which may include group support, education, or therapy.  Replacement products, such as patches, gum, and nasal sprays. Use these products only as directed. Do not replace cigarette smoking with electronic cigarettes (commonly called e-cigarettes). The safety of e-cigarettes is unknown, and some may contain harmful chemicals. FOR MORE INFORMATION  American  Lung Association: www.lung.org  American Cancer Society: www.cancer.org Document Released: 05/23/2004 Document Revised: 02/03/2013 Document Reviewed: 10/05/2012 [facility] Patient Information 2015 [place], [place]. This information is not intended to replace advice given to you by your health care provider. Make sure you discuss any questions you have with your health care provider.

## 2015-02-09 IMAGING — CR DG CHEST 2V
2 series · 2 of 2 positions shown · non-contrast
Comparison: 09/30/2013

CLINICAL DATA: Chest pain and shortness of breath since 4877 hr.

EXAM:
CHEST  2 VIEW

[view not recorded (1 of 2)]
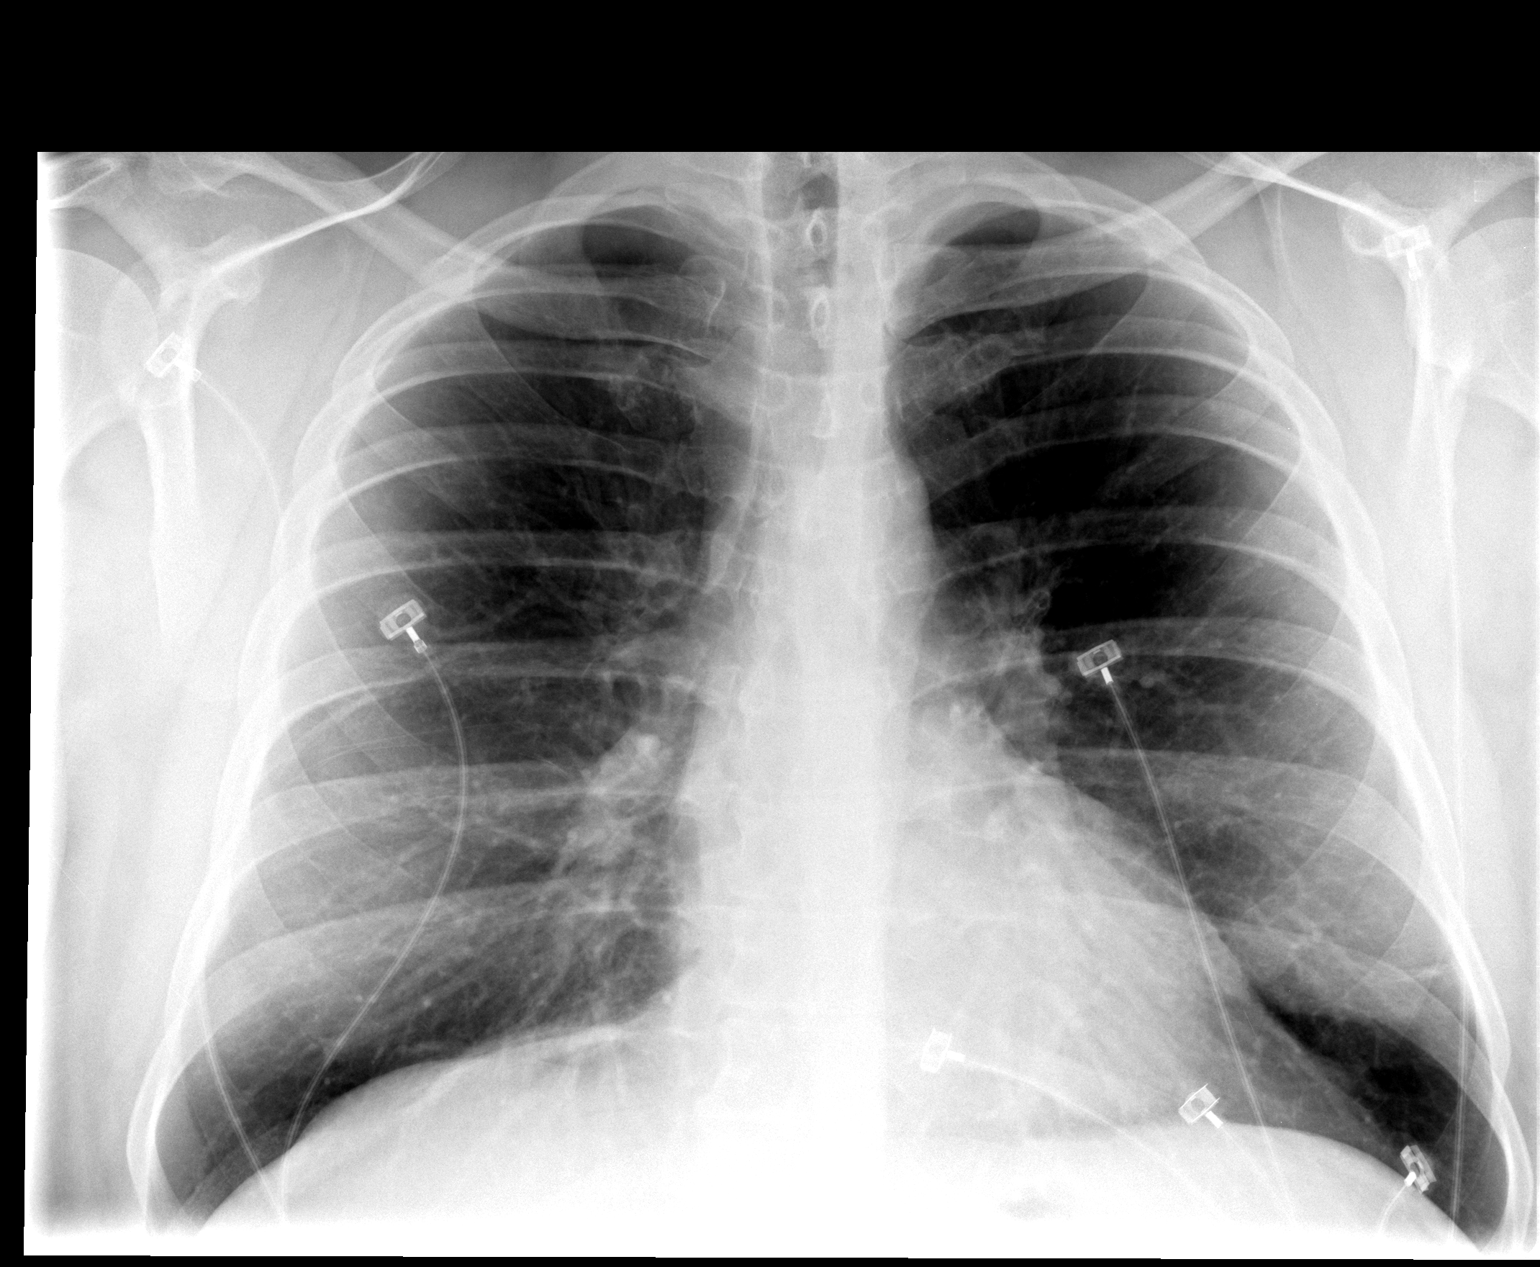

[view not recorded (2 of 2)]
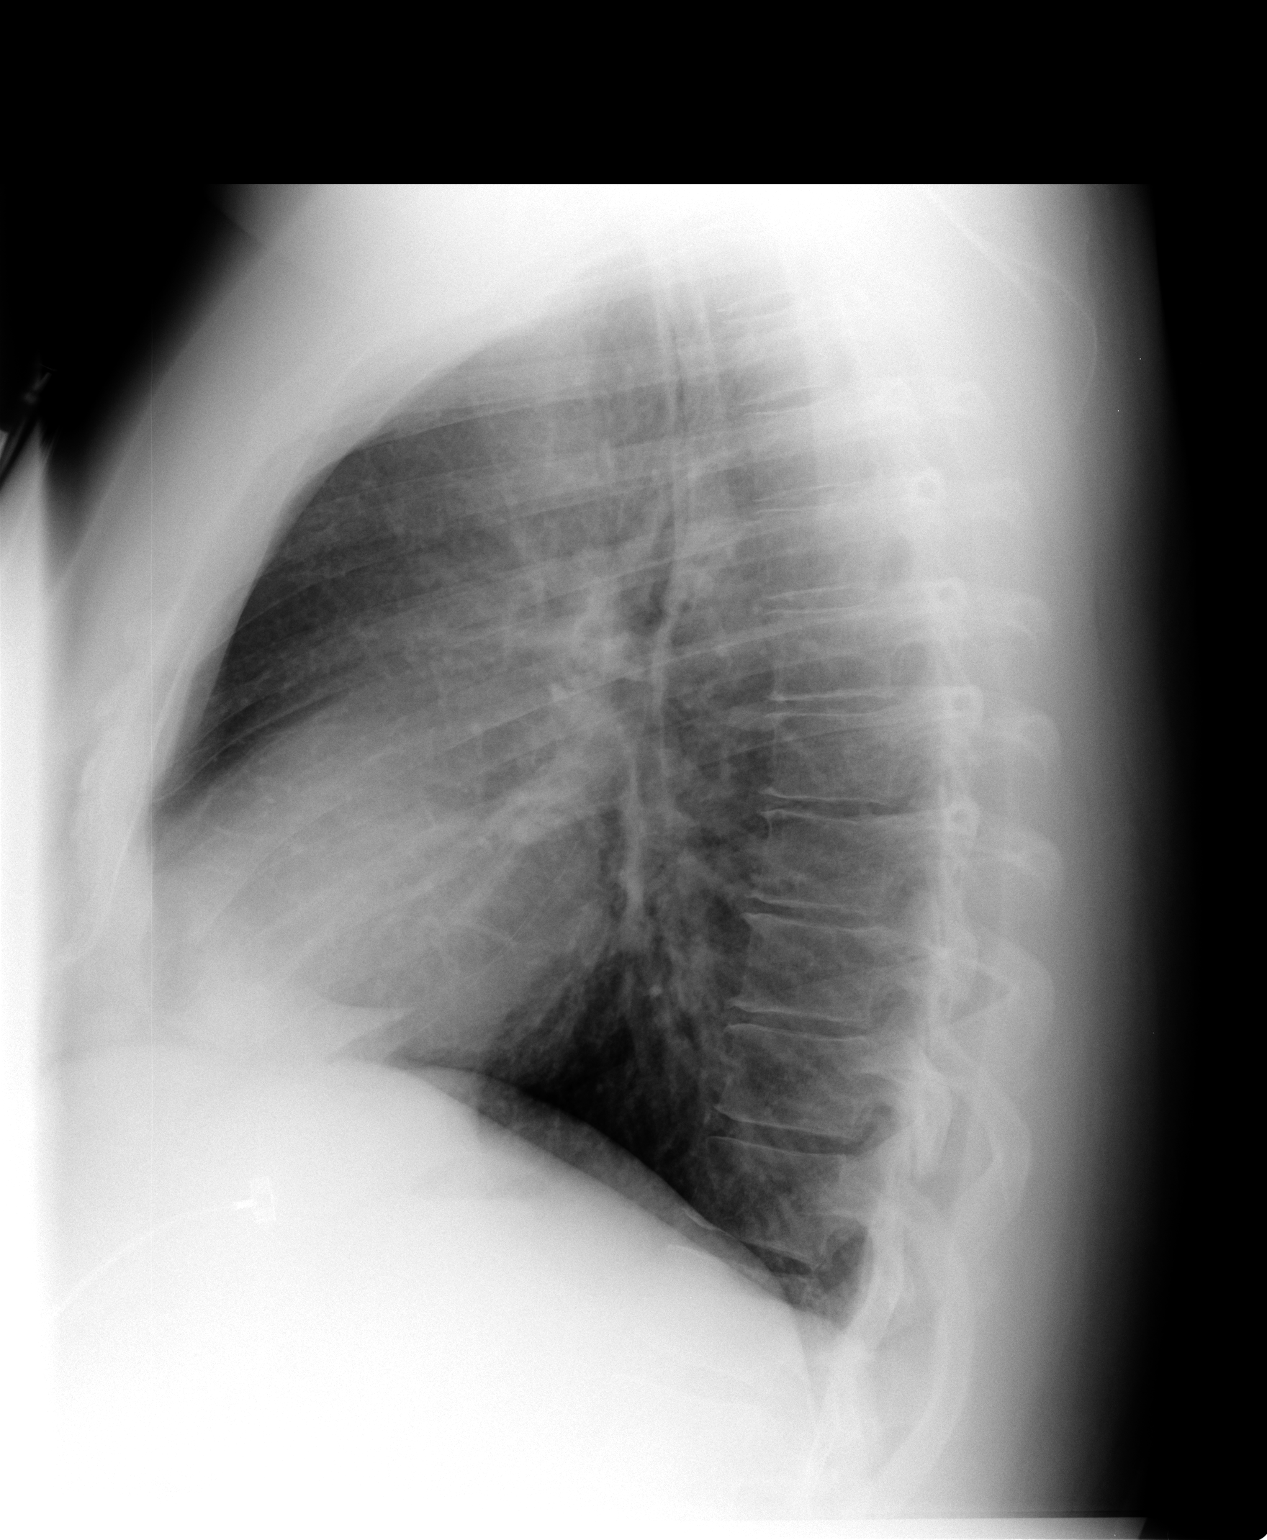

[2 of 2 positions shown; findings below may reference images not displayed]

FINDINGS: The heart size and mediastinal contours are within normal limits.
Both lungs are clear. The visualized skeletal structures are
unremarkable.
IMPRESSION: No active cardiopulmonary disease.

## 2015-02-10 DIAGNOSIS — G40409 Other generalized epilepsy and epileptic syndromes, not intractable, without status epilepticus: Secondary | ICD-10-CM | POA: Diagnosis not present

## 2015-02-10 DIAGNOSIS — I10 Essential (primary) hypertension: Secondary | ICD-10-CM | POA: Diagnosis not present

## 2015-02-10 DIAGNOSIS — E1165 Type 2 diabetes mellitus with hyperglycemia: Secondary | ICD-10-CM | POA: Diagnosis not present

## 2015-02-10 DIAGNOSIS — J4521 Mild intermittent asthma with (acute) exacerbation: Secondary | ICD-10-CM | POA: Diagnosis not present

## 2015-02-10 DIAGNOSIS — M545 Low back pain: Secondary | ICD-10-CM | POA: Diagnosis not present

## 2015-02-10 DIAGNOSIS — K210 Gastro-esophageal reflux disease with esophagitis: Secondary | ICD-10-CM | POA: Diagnosis not present

## 2015-02-12 ENCOUNTER — Emergency Department (HOSPITAL_COMMUNITY)
Admission: EM | Admit: 2015-02-12 | Discharge: 2015-02-12 | Disposition: A | Discharge status: Home / Self Care | Payer: Medicare Other | Attending: Emergency Medicine | Admitting: Emergency Medicine

## 2015-02-12 ENCOUNTER — Encounter (HOSPITAL_COMMUNITY): Payer: Self-pay | Admitting: Emergency Medicine

## 2015-02-12 ENCOUNTER — Emergency Department (HOSPITAL_COMMUNITY): Payer: Medicare Other

## 2015-02-12 DIAGNOSIS — R05 Cough: Secondary | ICD-10-CM | POA: Diagnosis present

## 2015-02-12 DIAGNOSIS — J4521 Mild intermittent asthma with (acute) exacerbation: Secondary | ICD-10-CM | POA: Insufficient documentation

## 2015-02-12 DIAGNOSIS — Z79899 Other long term (current) drug therapy: Secondary | ICD-10-CM | POA: Diagnosis not present

## 2015-02-12 DIAGNOSIS — I25119 Atherosclerotic heart disease of native coronary artery with unspecified angina pectoris: Secondary | ICD-10-CM | POA: Insufficient documentation

## 2015-02-12 DIAGNOSIS — M791 Myalgia: Secondary | ICD-10-CM | POA: Insufficient documentation

## 2015-02-12 DIAGNOSIS — F17210 Nicotine dependence, cigarettes, uncomplicated: Secondary | ICD-10-CM | POA: Diagnosis not present

## 2015-02-12 DIAGNOSIS — E119 Type 2 diabetes mellitus without complications: Secondary | ICD-10-CM | POA: Diagnosis not present

## 2015-02-12 DIAGNOSIS — R112 Nausea with vomiting, unspecified: Secondary | ICD-10-CM | POA: Diagnosis not present

## 2015-02-12 DIAGNOSIS — Z720 Tobacco use: Secondary | ICD-10-CM | POA: Diagnosis not present

## 2015-02-12 DIAGNOSIS — R197 Diarrhea, unspecified: Secondary | ICD-10-CM | POA: Insufficient documentation

## 2015-02-12 DIAGNOSIS — Z880 Allergy status to penicillin: Secondary | ICD-10-CM | POA: Diagnosis not present

## 2015-02-12 DIAGNOSIS — G40909 Epilepsy, unspecified, not intractable, without status epilepticus: Secondary | ICD-10-CM | POA: Diagnosis not present

## 2015-02-12 DIAGNOSIS — I10 Essential (primary) hypertension: Secondary | ICD-10-CM | POA: Insufficient documentation

## 2015-02-12 MED ORDER — PREDNISONE 10 MG PO TABS: ORAL_TABLET | ORAL | Status: DC

## 2015-02-12 MED ORDER — PREDNISONE 10 MG PO TABS
60.0000 mg | ORAL_TABLET | Freq: Once | ORAL | Status: AC
  Administered 2015-02-12: 60 mg via ORAL
  Filled 2015-02-12 (×2): qty 1

## 2015-02-12 MED ORDER — GUAIFENESIN-CODEINE 100-10 MG/5ML PO SYRP: 10.0000 mL | ORAL_SOLUTION | Freq: Three times a day (TID) | ORAL | Status: DC | PRN

## 2015-02-12 MED ORDER — ALBUTEROL SULFATE (2.5 MG/3ML) 0.083% IN NEBU: 2.5000 mg | INHALATION_SOLUTION | Freq: Four times a day (QID) | RESPIRATORY_TRACT | Status: DC | PRN

## 2015-02-12 MED ORDER — IPRATROPIUM-ALBUTEROL 0.5-2.5 (3) MG/3ML IN SOLN
3.0000 mL | Freq: Once | RESPIRATORY_TRACT | Status: AC
  Administered 2015-02-12: 3 mL via RESPIRATORY_TRACT
  Filled 2015-02-12: qty 3

## 2015-02-12 MED ORDER — ALBUTEROL SULFATE (2.5 MG/3ML) 0.083% IN NEBU
5.0000 mg | INHALATION_SOLUTION | Freq: Once | RESPIRATORY_TRACT | Status: AC
  Administered 2015-02-12: 5 mg via RESPIRATORY_TRACT
  Filled 2015-02-12: qty 6

## 2015-02-12 MED ORDER — ALBUTEROL SULFATE (2.5 MG/3ML) 0.083% IN NEBU
2.5000 mg | INHALATION_SOLUTION | Freq: Once | RESPIRATORY_TRACT | Status: AC
  Administered 2015-02-12: 2.5 mg via RESPIRATORY_TRACT
  Filled 2015-02-12: qty 3

## 2015-02-12 NOTE — ED Notes (Signed)
Pt c/o cold-like symptoms since Thursday. Reports chills, generalized body aches, and productive cough.

## 2015-02-12 NOTE — ED Provider Notes (Signed)
CSN: [id number]     Arrival date & time 02/12/15  1909 History  By signing my name below, I, Stephania Fragmin, attest that this documentation has been prepared under the direction and in the presence of Durinda Buzzelli, PA-C. Electronically Signed: Stephania Fragmin, ED Scribe. 02/12/2015. 8:11 PM.    Chief Complaint  Patient presents with  . Chills  . Cough   Patient is a 34 y.o. male presenting with cough. The history is provided by the patient. No language interpreter was used.  Cough Cough characteristics:  Productive Sputum characteristics:  Green Severity:  Moderate Onset quality:  Gradual Duration:  3 days Timing:  Constant Progression:  Worsening Context: sick contacts (brother has similar symptoms)   Relieved by:  None tried Worsened by:  Nothing tried Ineffective treatments:  None tried Associated symptoms: chills, myalgias (generalized) and sore throat   Associated symptoms: no fever     HPI Comments: Chad Hampton is a 34 y.o. male with a history of asthma, CAD, and DM, who presents to the Emergency Department complaining of multiple complaints, including a productive cough with green sputum, nasal congestion, and sore throat that began 3 days ago. He reports last night he also developed generalized myalgias, chills, 4-5 episodes of vomiting, and diarrhea. He states he has been vomiting less today than he was last night. Patient notes his brother also appears to be sick. Patient states he has not taken any medication for his symptoms. He reports having an inhaler and nebulizer at home, but he has not used them. He denies any known fever, shortness of breath, and chest pain    Past Medical History  Diagnosis Date  . Seizure disorder   . Asthma   . Hypertension   . Acute angina   . CAD (coronary artery disease)   . Seizures   . Diabetes mellitus without complication    Past Surgical History  Procedure Laterality Date  . Cholecystectomy    . Eye surgery    . Facial reconstruction  surgery     Family History  Problem Relation Age of Onset  . Diabetes Mother   . Hypertension Mother   . Heart failure Father   . Stroke Father   . Diabetes Father   . Heart failure Brother    Social History  Substance Use Topics  . Smoking status: Current Every Day Smoker -- 0.50 packs/day for 20 years    Types: Cigarettes  . Smokeless tobacco: Never Used  . Alcohol Use: Yes     Comment: occasionally    Review of Systems  Constitutional: Positive for chills. Negative for fever.  HENT: Positive for congestion and sore throat.   Respiratory: Positive for cough.   Gastrointestinal: Positive for nausea, vomiting and diarrhea.  Musculoskeletal: Positive for myalgias (generalized).  All other systems reviewed and are negative.  Allergies  Morphine and related; Penicillins; Tramadol; and Ibuprofen  Home Medications   Prior to Admission medications   Medication Sig Start Date End Date Taking? Authorizing Provider  albuterol (PROVENTIL) (2.5 MG/3ML) 0.083% nebulizer solution Take 3 mLs (2.5 mg total) by nebulization every 6 (six) hours as needed for wheezing. 09/27/12   [place], NP  benazepril-hydrochlorthiazide (LOTENSIN HCT) 20-25 MG per tablet Take 1 tablet by mouth daily.    Historical Provider, MD  cyclobenzaprine (FLEXERIL) 10 MG tablet Take 1 tablet (10 mg total) by mouth 3 (three) times daily as needed. 10/08/14   Jayceon Troy, PA-C  divalproex (DEPAKOTE ER) 500 MG  24 hr tablet Take 500 mg by mouth daily.    Historical Provider, MD  nitroGLYCERIN (NITROSTAT) 0.4 MG SL tablet Place 0.4 mg under the tongue every 5 (five) minutes as needed for chest pain.     Historical Provider, MD  phenytoin (DILANTIN) 100 MG ER capsule Take 300 mg by mouth every morning.      Historical Provider, MD   There were no vitals taken for this visit. Physical Exam  Constitutional: He is oriented to person, place, and time. He appears well-developed and well-nourished. No distress.  HENT:   Head: Normocephalic and atraumatic.  Mouth/Throat: No oropharyngeal exudate.  Erythema and edema of the bilateral tonsils. Uvula midline. No exudates.   Eyes: Conjunctivae and EOM are normal.  Neck: Neck supple. No tracheal deviation present.  Cardiovascular: Normal rate, regular rhythm and normal heart sounds.   Pulmonary/Chest: Effort normal. No respiratory distress. He has wheezes.  Inspiratory and expiratory wheezes bilaterally. No rales.   Abdominal: Soft. He exhibits no distension. There is no tenderness.  Musculoskeletal: Normal range of motion.  Lymphadenopathy:    He has no cervical adenopathy.  Neurological: He is alert and oriented to person, place, and time.  Skin: Skin is warm and dry.  Psychiatric: He has a normal mood and affect. His behavior is normal.  Nursing note and vitals reviewed.   ED Course  Procedures (including critical care time)  DIAGNOSTIC STUDIES: Oxygen Saturation is 100% on RA, normal by my interpretation.    COORDINATION OF CARE: 7:39 PM - Doubt strep throat. Discussed treatment plan with pt at bedside which includes CXR. Pt verbalized understanding and agreed to plan.   Imaging Review Dg Chest 2 View  02/12/2015  CLINICAL DATA:  Cold like symptoms for 4 days. Chills and generalized body aches. Productive cough. EXAM: CHEST  2 VIEW COMPARISON:  10/19/2013 FINDINGS: The heart size and mediastinal contours are within normal limits. Both lungs are clear. No pleural effusion or pneumothorax. The visualized skeletal structures are unremarkable. IMPRESSION: No active cardiopulmonary disease. Electronically Signed   By: Lajean Manes M.D.   On: 02/12/2015 20:55   Dg Hand Complete Right  01/23/2015  CLINICAL DATA:  Hit right hand putting motor in car. Initial encounter. EXAM: RIGHT HAND - COMPLETE 3+ VIEW COMPARISON:  02/08/2011 FINDINGS: There is no evidence of acute fracture or dislocation. No opaque foreign body. Noted remote fifth metacarpal base  fracture with regional spurring. IMPRESSION: No acute bony finding. Electronically Signed   By: Monte Fantasia M.D.   On: 01/23/2015 00:04   I have personally reviewed and evaluated these images and lab results as part of my medical decision-making.  MDM   Final diagnoses:  Asthmatic bronchitis, mild intermittent, with acute exacerbation    Pt is feeling better after nebs and lung sounds improved.  Vitals remain stable.  Wells score neg for PE.  Pt agrees to tx plan and close PMD f/u.  He agrees to return here or any worsening sx's.  I personally performed the services described in this documentation, which was scribed in my presence. The recorded information has been reviewed and is accurate.     Kem Parkinson, PA-C 02/14/15 [place], DO 02/15/15 2105

## 2015-02-12 NOTE — Discharge Instructions (Signed)

## 2015-02-15 DIAGNOSIS — I10 Essential (primary) hypertension: Secondary | ICD-10-CM | POA: Diagnosis not present

## 2015-02-15 DIAGNOSIS — E1165 Type 2 diabetes mellitus with hyperglycemia: Secondary | ICD-10-CM | POA: Diagnosis not present

## 2015-03-14 ENCOUNTER — Emergency Department (HOSPITAL_COMMUNITY)
Admission: EM | Admit: 2015-03-14 | Discharge: 2015-03-14 | Disposition: A | Discharge status: Home / Self Care | Payer: Medicare Other | Attending: Emergency Medicine | Admitting: Emergency Medicine

## 2015-03-14 ENCOUNTER — Emergency Department (HOSPITAL_COMMUNITY): Payer: Medicare Other

## 2015-03-14 ENCOUNTER — Encounter (HOSPITAL_COMMUNITY): Payer: Self-pay | Admitting: *Deleted

## 2015-03-14 DIAGNOSIS — J45901 Unspecified asthma with (acute) exacerbation: Secondary | ICD-10-CM | POA: Insufficient documentation

## 2015-03-14 DIAGNOSIS — F17210 Nicotine dependence, cigarettes, uncomplicated: Secondary | ICD-10-CM | POA: Diagnosis not present

## 2015-03-14 DIAGNOSIS — H538 Other visual disturbances: Secondary | ICD-10-CM | POA: Diagnosis not present

## 2015-03-14 DIAGNOSIS — R079 Chest pain, unspecified: Secondary | ICD-10-CM | POA: Diagnosis not present

## 2015-03-14 DIAGNOSIS — Z79899 Other long term (current) drug therapy: Secondary | ICD-10-CM | POA: Insufficient documentation

## 2015-03-14 DIAGNOSIS — I25719 Atherosclerosis of autologous vein coronary artery bypass graft(s) with unspecified angina pectoris: Secondary | ICD-10-CM | POA: Insufficient documentation

## 2015-03-14 DIAGNOSIS — G40909 Epilepsy, unspecified, not intractable, without status epilepticus: Secondary | ICD-10-CM | POA: Insufficient documentation

## 2015-03-14 DIAGNOSIS — R51 Headache: Secondary | ICD-10-CM | POA: Diagnosis not present

## 2015-03-14 DIAGNOSIS — M545 Low back pain: Secondary | ICD-10-CM | POA: Diagnosis not present

## 2015-03-14 DIAGNOSIS — R61 Generalized hyperhidrosis: Secondary | ICD-10-CM | POA: Insufficient documentation

## 2015-03-14 DIAGNOSIS — I10 Essential (primary) hypertension: Secondary | ICD-10-CM | POA: Diagnosis not present

## 2015-03-14 DIAGNOSIS — R072 Precordial pain: Secondary | ICD-10-CM | POA: Diagnosis not present

## 2015-03-14 DIAGNOSIS — E119 Type 2 diabetes mellitus without complications: Secondary | ICD-10-CM | POA: Insufficient documentation

## 2015-03-14 DIAGNOSIS — Z880 Allergy status to penicillin: Secondary | ICD-10-CM | POA: Insufficient documentation

## 2015-03-14 LAB — COMPREHENSIVE METABOLIC PANEL
ALBUMIN: 4.4 g/dL (ref 3.5–5.0)
ALT: 37 U/L (ref 17–63)
AST: 35 U/L (ref 15–41)
Alkaline Phosphatase: 96 U/L (ref 38–126)
Anion gap: 11 (ref 5–15)
BUN: 9 mg/dL (ref 6–20)
CHLORIDE: 103 mmol/L (ref 101–111)
CO2: 23 mmol/L (ref 22–32)
CREATININE: 0.91 mg/dL (ref 0.61–1.24)
Calcium: 9.3 mg/dL (ref 8.9–10.3)
GFR calc Af Amer: >60 mL/min (ref >60)
GLUCOSE: 132 mg/dL — AB (ref 65–99)
POTASSIUM: 3.3 mmol/L — AB (ref 3.5–5.1)
SODIUM: 137 mmol/L (ref 135–145)
Total Bilirubin: 0.9 mg/dL (ref 0.3–1.2)
Total Protein: 8.2 g/dL — ABNORMAL HIGH (ref 6.5–8.1)

## 2015-03-14 LAB — CBC
HCT: 49.4 % (ref 39.0–52.0)
HEMOGLOBIN: 17.4 g/dL — AB (ref 13.0–17.0)
MCH: 31.9 pg (ref 26.0–34.0)
MCHC: 35.2 g/dL (ref 30.0–36.0)
MCV: 90.6 fL (ref 78.0–100.0)
PLATELETS: 236 10*3/uL (ref 150–400)
RBC: 5.45 MIL/uL (ref 4.22–5.81)
RDW: 12.6 % (ref 11.5–15.5)
WBC: 12.7 10*3/uL — ABNORMAL HIGH (ref 4.0–10.5)

## 2015-03-14 LAB — TROPONIN I

## 2015-03-14 MED ORDER — ASPIRIN 81 MG PO CHEW
324.0000 mg | CHEWABLE_TABLET | Freq: Once | ORAL | Status: AC
  Administered 2015-03-14: 324 mg via ORAL
  Filled 2015-03-14: qty 4

## 2015-03-14 MED ORDER — NITROGLYCERIN 0.4 MG SL SUBL
0.4000 mg | SUBLINGUAL_TABLET | SUBLINGUAL | Status: DC | PRN
  Administered 2015-03-14: 0.4 mg via SUBLINGUAL
  Filled 2015-03-14: qty 1

## 2015-03-14 NOTE — ED Notes (Signed)
Patient tearful at this time

## 2015-03-14 NOTE — ED Provider Notes (Signed)
CSN: [id number]     Arrival date & time 03/14/15  2009 History  By signing my name below, I, Meriel Pica, attest that this documentation has been prepared under the direction and in the presence of Fredia Sorrow, MD. Electronically Signed: Meriel Pica, ED Scribe. 03/14/2015. 9:29 PM.   Chief Complaint  Patient presents with  . Chest Pain   Patient is a 34 y.o. male presenting with chest pain. The history is provided by the patient. No language interpreter was used.  Chest Pain Pain location:  Substernal area Pain radiates to:  L arm Pain radiates to the back: no   Pain severity:  Moderate Timing:  Intermittent Progression:  Waxing and waning Chronicity:  Chronic Relieved by:  None tried Ineffective treatments:  None tried Associated symptoms: back pain ( baseline), cough, diaphoresis ( w/ CP), headache and shortness of breath   Associated symptoms: no abdominal pain, no fever, no nausea and not vomiting   Risk factors: coronary artery disease, diabetes mellitus, hypertension, male sex and smoking    HPI Comments: Chad Hampton is a 34 y.o. male, with a PMhx of seizure disorder, asthma, HTN, DM, acute angina, and CAD, who presents to the Emergency Department complaining of substernal CP that has been intermittent over the past several months but progressed over the past 3 days and became a waxing and waning CP 15 hours ago, this morning. He reports 5/10 CP currently. Pt also reports radiation of the CP down his left arm this morning that has resolved. Pt associates diaphoresis and SOB with CP. He is a current daily smoker. He is prescribed 81 mg Aspirin but is non compliant with this medication. No h/o cardiac stents. Pt was followed by a cardiologist with [facility] with the last appointment being 1.5 years ago. H/o cardiac catheterization 4-5 years ago. Pt has an appointment with a new cardiologist in [place] in 8 days.   Per wife, the pt has a history of seizures and reportedly had  one today while helping his brother work on a vehicle that she did not witness. He is prescribed an anticonvulsant medication that he is non compliant with.  He associates bilateral mandible pain and a mild headache s/p seizure but denies fall or head injury.   PCP: Dr. Sherrie Sport   Past Medical History  Diagnosis Date  . Seizure disorder ([place])   . Asthma   . Hypertension   . Acute angina ([place])   . CAD (coronary artery disease)   . Seizures ([place])   . Diabetes mellitus without complication [facility])    Past Surgical History  Procedure Laterality Date  . Cholecystectomy    . Eye surgery    . Facial reconstruction surgery     Family History  Problem Relation Age of Onset  . Diabetes Mother   . Hypertension Mother   . Heart failure Father   . Stroke Father   . Diabetes Father   . Heart failure Brother    Social History  Substance Use Topics  . Smoking status: Current Every Day Smoker -- 0.50 packs/day for 20 years    Types: Cigarettes  . Smokeless tobacco: Never Used  . Alcohol Use: Yes     Comment: occasionally    Review of Systems  Constitutional: Positive for diaphoresis ( w/ CP). Negative for fever.  HENT: Negative for congestion, rhinorrhea and sore throat.   Eyes: Positive for visual disturbance ( floaters).  Respiratory: Positive for cough and shortness of breath.  Cardiovascular: Positive for chest pain. Negative for leg swelling.  Gastrointestinal: Negative for nausea, vomiting, abdominal pain and diarrhea.  Genitourinary: Negative for dysuria and hematuria.  Musculoskeletal: Positive for back pain ( baseline). Negative for neck pain.  Skin: Negative for rash.  Neurological: Positive for seizures and headaches.  Hematological: Does not bruise/bleed easily.  Psychiatric/Behavioral: Negative for confusion.   Allergies  Morphine and related; Penicillins; Tramadol; and Ibuprofen  Home Medications   Prior to Admission medications   Medication Sig Start Date End  Date Taking? Authorizing Provider  albuterol (PROVENTIL) (2.5 MG/3ML) 0.083% nebulizer solution Take 3 mLs (2.5 mg total) by nebulization every 6 (six) hours as needed for wheezing or shortness of breath. 02/12/15  Yes Tammy Triplett, PA-C  nitroGLYCERIN (NITROSTAT) 0.4 MG SL tablet Place 0.4 mg under the tongue every 5 (five) minutes as needed for chest pain.    Yes Historical Provider, MD  benazepril-hydrochlorthiazide (LOTENSIN HCT) 20-25 MG per tablet Take 1 tablet by mouth daily.    Historical Provider, MD  cyclobenzaprine (FLEXERIL) 10 MG tablet Take 1 tablet (10 mg total) by mouth 3 (three) times daily as needed. Patient not taking: Reported on 03/14/2015 10/08/14   Tammy Triplett, PA-C  divalproex (DEPAKOTE ER) 500 MG 24 hr tablet Take 500 mg by mouth daily.    Historical Provider, MD  guaiFENesin-codeine (ROBITUSSIN AC) 100-10 MG/5ML syrup Take 10 mLs by mouth 3 (three) times daily as needed. Patient not taking: Reported on 03/14/2015 02/12/15   Tammy Triplett, PA-C  phenytoin (DILANTIN) 100 MG ER capsule Take 300 mg by mouth every morning.      Historical Provider, MD  predniSONE (DELTASONE) 10 MG tablet Take 6 tablets day one, 5 tablets day two, 4 tablets day three, 3 tablets day four, 2 tablets day five, then 1 tablet day six Patient not taking: Reported on 03/14/2015 02/12/15   Tammy Triplett, PA-C   BP 143/87 mmHg  Pulse 98  Temp(Src) 98 F (36.7 C) (Oral)  Resp 19  Ht 6' (1.829 m)  Wt 265 lb (120.203 kg)  BMI 35.93 kg/m2  SpO2 97% Physical Exam  Constitutional: He is oriented to person, place, and time. He appears well-developed and well-nourished. No distress.  HENT:  Head: Normocephalic and atraumatic.  Mouth/Throat: Oropharynx is clear and moist.  Mucous membranes moist.   Eyes: Conjunctivae and EOM are normal. Pupils are equal, round, and reactive to light. No scleral icterus.  Neck: Normal range of motion. Neck supple. No tracheal deviation present.  Cardiovascular:  Normal rate, regular rhythm and normal heart sounds.  Exam reveals no gallop and no friction rub.   No murmur heard. Pulmonary/Chest: Effort normal and breath sounds normal. No respiratory distress.  Abdominal: Soft. Bowel sounds are normal. There is no tenderness.  Musculoskeletal: Normal range of motion. He exhibits no edema.  Neurological: He is alert and oriented to person, place, and time. No cranial nerve deficit. He exhibits normal muscle tone. Coordination normal.  Skin: Skin is warm and dry.  Psychiatric: He has a normal mood and affect. His behavior is normal.  Nursing note and vitals reviewed.   ED Course  Procedures  DIAGNOSTIC STUDIES: Oxygen Saturation is 97% on RA, normal by my interpretation.    COORDINATION OF CARE: 9:25 PM Discussed treatment plan with pt at bedside and pt agreed to plan.   Labs Review Labs Reviewed  COMPREHENSIVE METABOLIC PANEL - Abnormal; Notable for the following:    Potassium 3.3 (*)    Glucose, Bld  132 (*)    Total Protein 8.2 (*)    All other components within normal limits  CBC - Abnormal; Notable for the following:    WBC 12.7 (*)    Hemoglobin 17.4 (*)    All other components within normal limits  TROPONIN I   Results for orders placed or performed during the hospital encounter of 03/14/15  Comprehensive metabolic panel  Result Value Ref Range   Sodium 137 135 - 145 mmol/L   Potassium 3.3 (L) 3.5 - 5.1 mmol/L   Chloride 103 101 - 111 mmol/L   CO2 23 22 - 32 mmol/L   Glucose, Bld 132 (H) 65 - 99 mg/dL   BUN 9 6 - 20 mg/dL   Creatinine, Ser 0.91 0.61 - 1.24 mg/dL   Calcium 9.3 8.9 - 10.3 mg/dL   Total Protein 8.2 (H) 6.5 - 8.1 g/dL   Albumin 4.4 3.5 - 5.0 g/dL   AST 35 15 - 41 U/L   ALT 37 17 - 63 U/L   Alkaline Phosphatase 96 38 - 126 U/L   Total Bilirubin 0.9 0.3 - 1.2 mg/dL   GFR calc non Af Amer >60 >60 mL/min   GFR calc Af Amer >60 >60 mL/min   Anion gap 11 5 - 15  Troponin I  Result Value Ref Range   Troponin I  <0.03 <0.031 ng/mL  CBC  Result Value Ref Range   WBC 12.7 (H) 4.0 - 10.5 K/uL   RBC 5.45 4.22 - 5.81 MIL/uL   Hemoglobin 17.4 (H) 13.0 - 17.0 g/dL   HCT 49.4 39.0 - 52.0 %   MCV 90.6 78.0 - 100.0 fL   MCH 31.9 26.0 - 34.0 pg   MCHC 35.2 30.0 - 36.0 g/dL   RDW 12.6 11.5 - 15.5 %   Platelets 236 150 - 400 K/uL    Imaging Review Dg Chest 2 View  03/14/2015  CLINICAL DATA:  Acute onset of generalized chest pain and seizure. Initial encounter. EXAM: CHEST  2 VIEW COMPARISON:  Chest radiograph from 02/12/2015 FINDINGS: The lungs are well-aerated and clear. There is no evidence of focal opacification, pleural effusion or pneumothorax. The heart is normal in size; the mediastinal contour is within normal limits. No acute osseous abnormalities are seen. IMPRESSION: No acute cardiopulmonary process seen. Electronically Signed   By: Garald Balding M.D.   On: 03/14/2015 20:52   I have personally reviewed and evaluated these images and lab results as part of my medical decision-making.   EKG Interpretation   Date/Time:  Tuesday March 14 2015 20:15:58 EST Ventricular Rate:  101 PR Interval:  144 QRS Duration: 87 QT Interval:  333 QTC Calculation: 432 R Axis:   70 Text Interpretation:  Sinus tachycardia Confirmed by Rubye Strohmeyer  MD, Elleen Coulibaly  ([id number]) on 03/14/2015 8:28:31 PM      MDM   Final diagnoses:  Chest pain, unspecified chest pain type    Patient with upcoming appointment with cardiology for later this month. Patient has missed following a cardiology. Patient's workup for the chest pain today with negative troponin negative chest x-ray EKG without any acute findings. A sinus tachycardia present on the EKG has resolved. Patient's pain went away with aspirin and nitroglycerin. But pain was already improving. Patient had significant pain over the weekend has basically had pain every day for more than the last 7 days. The fact that the troponin is negative and the fact that the pain  wasn't actually worse later today is highly  unlikely that the chest pain is related to unstable angina or acute cardiac event.  Based on that negative troponin and the duration of the pain patient stable for discharge home and follow-up with cardiology.  I personally performed the services described in this documentation, which was scribed in my presence. The recorded information has been reviewed and is accurate.      Fredia Sorrow, MD 03/14/15 2312

## 2015-03-14 NOTE — Discharge Instructions (Signed)
Aspirin and Your Heart  Aspirin is a medicine that affects the way blood clots. Aspirin can be used to help reduce the risk of blood clots, heart attacks, and other heart-related problems.  SHOULD I TAKE ASPIRIN? Your health care provider will help you determine whether it is safe and beneficial for you to take aspirin daily. Taking aspirin daily may be beneficial if you:  Have had a heart attack or chest pain.  Have undergone open heart surgery such as coronary artery bypass surgery (CABG).  Have had coronary angioplasty.  Have experienced a stroke or transient ischemic attack (TIA).  Have peripheral vascular disease (PVD).  Have chronic heart rhythm problems such as atrial fibrillation. ARE THERE ANY RISKS OF TAKING ASPIRIN DAILY? Daily use of aspirin can increase your risk of side effects. Some of these include:  Bleeding. Bleeding problems can be minor or serious. An example of a minor problem is a cut that does not stop bleeding. An example of a more serious problem is stomach bleeding or bleeding into the brain. Your risk of bleeding is increased if you are also taking non-steroidal anti-inflammatory medicine (NSAIDs).  Increased bruising.  Upset stomach.  An allergic reaction. People who have nasal polyps have an increased risk of developing an aspirin allergy. WHAT ARE SOME GUIDELINES I SHOULD FOLLOW WHEN TAKING ASPIRIN?   Take aspirin only as directed by your health care provider. Make sure you understand how much you should take and what form you should take. The two forms of aspirin are:  Non-enteric-coated. This type of aspirin does not have a coating and is absorbed quickly. Non-enteric-coated aspirin is usually recommended for people with chest pain. This type of aspirin also comes in a chewable form.  Enteric-coated. This type of aspirin has a special coating that releases the medicine very slowly. Enteric-coated aspirin causes less stomach upset than non-enteric-coated  aspirin. This type of aspirin should not be chewed or crushed.  Drink alcohol in moderation. Drinking alcohol increases your risk of bleeding. WHEN SHOULD I SEEK MEDICAL CARE?   You have unusual bleeding or bruising.  You have stomach pain.  You have an allergic reaction. Symptoms of an allergic reaction include:  Hives.  Itchy skin.  Swelling of the lips, tongue, or face.  You have ringing in your ears. WHEN SHOULD I SEEK IMMEDIATE MEDICAL CARE?   Your bowel movements are bloody, dark red, or black in color.  You vomit or cough up blood.  You have blood in your urine.  You cough, wheeze, or feel short of breath. If you have any of the following symptoms, this is an emergency. Do not wait to see if the pain will go away. Get medical help at once. Call your local emergency services (911 in the [place] Do not drive yourself to the hospital.  You have severe chest pain, especially if the pain is crushing or pressure-like and spreads to the arms, back, neck, or jaw.  You have stroke-like symptoms, such as:   Loss of vision.   Difficulty talking.   Numbness or weakness on one side of your body.   Numbness or weakness in your arm or leg.   Not thinking clearly or feeling confused.    This information is not intended to replace advice given to you by your health care provider. Make sure you discuss any questions you have with your health care provider.  You start taking a baby aspirin a day. Keep your appointment with cardiology for this month.  Return for any new or worse symptoms. Continue her other medications.   Document Released: 03/28/2008 Document Revised: 05/06/2014 Document Reviewed: 07/21/2013 Elsevier Interactive Patient Education [employer].

## 2015-03-14 NOTE — ED Notes (Signed)
Pt reports relief of chest pain.

## 2015-03-14 NOTE — ED Notes (Signed)
Pt alert & oriented x4, stable gait. Patient given discharge instructions, paperwork & prescription(s). Patient  instructed to stop at the registration desk to finish any additional paperwork. Patient verbalized understanding. Pt left department w/ no further questions. 

## 2015-03-14 NOTE — ED Notes (Signed)
Pt states chest pain for the past 3 days & says had a seizure a few hors ago. No bowle or bladder lose, no damage to mouth.

## 2015-03-14 NOTE — ED Notes (Signed)
Pt up to bathroom.

## 2015-05-10 ENCOUNTER — Emergency Department (HOSPITAL_COMMUNITY)
Admission: EM | Admit: 2015-05-10 | Discharge: 2015-05-11 | Disposition: A | Discharge status: Home / Self Care | Payer: Medicare Other | Attending: Emergency Medicine | Admitting: Emergency Medicine

## 2015-05-10 ENCOUNTER — Encounter (HOSPITAL_COMMUNITY): Payer: Self-pay

## 2015-05-10 DIAGNOSIS — Z79899 Other long term (current) drug therapy: Secondary | ICD-10-CM | POA: Insufficient documentation

## 2015-05-10 DIAGNOSIS — I10 Essential (primary) hypertension: Secondary | ICD-10-CM | POA: Insufficient documentation

## 2015-05-10 DIAGNOSIS — Z7982 Long term (current) use of aspirin: Secondary | ICD-10-CM | POA: Insufficient documentation

## 2015-05-10 DIAGNOSIS — F32A Depression, unspecified: Secondary | ICD-10-CM

## 2015-05-10 DIAGNOSIS — E119 Type 2 diabetes mellitus without complications: Secondary | ICD-10-CM | POA: Diagnosis not present

## 2015-05-10 DIAGNOSIS — F329 Major depressive disorder, single episode, unspecified: Secondary | ICD-10-CM | POA: Insufficient documentation

## 2015-05-10 DIAGNOSIS — J45909 Unspecified asthma, uncomplicated: Secondary | ICD-10-CM | POA: Diagnosis not present

## 2015-05-10 DIAGNOSIS — I25118 Atherosclerotic heart disease of native coronary artery with other forms of angina pectoris: Secondary | ICD-10-CM | POA: Diagnosis not present

## 2015-05-10 DIAGNOSIS — F17210 Nicotine dependence, cigarettes, uncomplicated: Secondary | ICD-10-CM | POA: Insufficient documentation

## 2015-05-10 DIAGNOSIS — F1210 Cannabis abuse, uncomplicated: Secondary | ICD-10-CM | POA: Diagnosis present

## 2015-05-10 DIAGNOSIS — R45851 Suicidal ideations: Secondary | ICD-10-CM | POA: Insufficient documentation

## 2015-05-10 DIAGNOSIS — Z008 Encounter for other general examination: Secondary | ICD-10-CM | POA: Diagnosis present

## 2015-05-10 DIAGNOSIS — F332 Major depressive disorder, recurrent severe without psychotic features: Secondary | ICD-10-CM | POA: Diagnosis present

## 2015-05-10 DIAGNOSIS — Z880 Allergy status to penicillin: Secondary | ICD-10-CM | POA: Insufficient documentation

## 2015-05-10 DIAGNOSIS — F1994 Other psychoactive substance use, unspecified with psychoactive substance-induced mood disorder: Secondary | ICD-10-CM | POA: Diagnosis present

## 2015-05-10 NOTE — ED Notes (Signed)
Pt admits to worsening depression over the past 2 weeks, reports problems with ex-wife and current relationship problems.  Pt admits that he has thought about overdosing on medications.

## 2015-05-11 DIAGNOSIS — F1210 Cannabis abuse, uncomplicated: Secondary | ICD-10-CM

## 2015-05-11 DIAGNOSIS — F332 Major depressive disorder, recurrent severe without psychotic features: Secondary | ICD-10-CM | POA: Diagnosis present

## 2015-05-11 DIAGNOSIS — F1994 Other psychoactive substance use, unspecified with psychoactive substance-induced mood disorder: Secondary | ICD-10-CM

## 2015-05-11 DIAGNOSIS — F329 Major depressive disorder, single episode, unspecified: Secondary | ICD-10-CM | POA: Diagnosis not present

## 2015-05-11 LAB — CBG MONITORING, ED: GLUCOSE-CAPILLARY: 55 mg/dL — AB (ref 65–99)

## 2015-05-11 LAB — COMPREHENSIVE METABOLIC PANEL
ALK PHOS: 89 U/L (ref 38–126)
ALT: 35 U/L (ref 17–63)
AST: 22 U/L (ref 15–41)
Albumin: 4.2 g/dL (ref 3.5–5.0)
Anion gap: 9 (ref 5–15)
BILIRUBIN TOTAL: 0.8 mg/dL (ref 0.3–1.2)
BUN: 13 mg/dL (ref 6–20)
CALCIUM: 9.4 mg/dL (ref 8.9–10.3)
CHLORIDE: 107 mmol/L (ref 101–111)
CO2: 23 mmol/L (ref 22–32)
CREATININE: 0.85 mg/dL (ref 0.61–1.24)
Glucose, Bld: 106 mg/dL — ABNORMAL HIGH (ref 65–99)
Potassium: 4.1 mmol/L (ref 3.5–5.1)
Sodium: 139 mmol/L (ref 135–145)
TOTAL PROTEIN: 8.1 g/dL (ref 6.5–8.1)

## 2015-05-11 LAB — CBC WITH DIFFERENTIAL/PLATELET
Basophils Absolute: 0.1 10*3/uL (ref 0.0–0.1)
Basophils Relative: 1 %
EOS ABS: 0.2 10*3/uL (ref 0.0–0.7)
EOS PCT: 2 %
HCT: 46.9 % (ref 39.0–52.0)
Hemoglobin: 16.6 g/dL (ref 13.0–17.0)
LYMPHS ABS: 3.8 10*3/uL (ref 0.7–4.0)
Lymphocytes Relative: 35 %
MCH: 32 pg (ref 26.0–34.0)
MCHC: 35.4 g/dL (ref 30.0–36.0)
MCV: 90.4 fL (ref 78.0–100.0)
MONO ABS: 0.4 10*3/uL (ref 0.1–1.0)
Monocytes Relative: 4 %
Neutro Abs: 6.5 10*3/uL (ref 1.7–7.7)
Neutrophils Relative %: 58 %
Platelets: 248 10*3/uL (ref 150–400)
RBC: 5.19 MIL/uL (ref 4.22–5.81)
RDW: 13 % (ref 11.5–15.5)
WBC: 11 10*3/uL — AB (ref 4.0–10.5)

## 2015-05-11 LAB — RAPID URINE DRUG SCREEN, HOSP PERFORMED
AMPHETAMINES: NOT DETECTED
BARBITURATES: NOT DETECTED
BENZODIAZEPINES: NOT DETECTED
COCAINE: NOT DETECTED
Opiates: NOT DETECTED
Tetrahydrocannabinol: POSITIVE — AB

## 2015-05-11 LAB — PHENYTOIN LEVEL, TOTAL

## 2015-05-11 LAB — VALPROIC ACID LEVEL: Valproic Acid Lvl: 115 ug/mL — ABNORMAL HIGH (ref 50.0–100.0)

## 2015-05-11 LAB — ETHANOL

## 2015-05-11 MED ORDER — ZOLPIDEM TARTRATE 5 MG PO TABS: 5.0000 mg | ORAL_TABLET | Freq: Every evening | ORAL | Status: DC | PRN

## 2015-05-11 MED ORDER — ZIPRASIDONE MESYLATE 20 MG IM SOLR
20.0000 mg | Freq: Once | INTRAMUSCULAR | Status: DC
  Filled 2015-05-11: qty 20

## 2015-05-11 MED ORDER — LORAZEPAM 1 MG PO TABS: 1.0000 mg | ORAL_TABLET | Freq: Three times a day (TID) | ORAL | Status: DC | PRN

## 2015-05-11 MED ORDER — ALUM & MAG HYDROXIDE-SIMETH 200-200-20 MG/5ML PO SUSP: 30.0000 mL | ORAL | Status: DC | PRN

## 2015-05-11 MED ORDER — PHENYTOIN SODIUM EXTENDED 100 MG PO CAPS
300.0000 mg | ORAL_CAPSULE | ORAL | Status: DC
  Administered 2015-05-11: 300 mg via ORAL
  Filled 2015-05-11: qty 3

## 2015-05-11 MED ORDER — STERILE WATER FOR INJECTION IJ SOLN
INTRAMUSCULAR | Status: AC
  Filled 2015-05-11: qty 10

## 2015-05-11 MED ORDER — ALBUTEROL SULFATE (2.5 MG/3ML) 0.083% IN NEBU: 2.5000 mg | INHALATION_SOLUTION | Freq: Four times a day (QID) | RESPIRATORY_TRACT | Status: DC | PRN

## 2015-05-11 MED ORDER — ONDANSETRON HCL 4 MG PO TABS: 4.0000 mg | ORAL_TABLET | Freq: Three times a day (TID) | ORAL | Status: DC | PRN

## 2015-05-11 MED ORDER — DIVALPROEX SODIUM ER 500 MG PO TB24
500.0000 mg | ORAL_TABLET | Freq: Every day | ORAL | Status: DC
  Administered 2015-05-11: 500 mg via ORAL
  Filled 2015-05-11: qty 1

## 2015-05-11 MED ORDER — GLIMEPIRIDE 2 MG PO TABS
4.0000 mg | ORAL_TABLET | Freq: Every day | ORAL | Status: DC
  Administered 2015-05-11: 4 mg via ORAL
  Filled 2015-05-11 (×2): qty 1

## 2015-05-11 MED ORDER — NICOTINE 7 MG/24HR TD PT24
7.0000 mg | MEDICATED_PATCH | Freq: Every day | TRANSDERMAL | Status: DC
  Filled 2015-05-11 (×2): qty 1

## 2015-05-11 MED ORDER — ASPIRIN 81 MG PO CHEW
81.0000 mg | CHEWABLE_TABLET | Freq: Every day | ORAL | Status: DC
  Administered 2015-05-11: 81 mg via ORAL
  Filled 2015-05-11: qty 1

## 2015-05-11 MED ORDER — ACETAMINOPHEN 325 MG PO TABS: 650.0000 mg | ORAL_TABLET | ORAL | Status: DC | PRN

## 2015-05-11 MED ORDER — NICOTINE 21 MG/24HR TD PT24: 21.0000 mg | MEDICATED_PATCH | Freq: Every day | TRANSDERMAL | Status: DC

## 2015-05-11 NOTE — ED Notes (Signed)
Crackers and peanut butter given 

## 2015-05-11 NOTE — ED Provider Notes (Signed)
CSN: [id number]     Arrival date & time 05/10/15  2323 History   First MD Initiated Contact with Patient 05/11/15 0211     Chief Complaint  Patient presents with  . V70.1     (Consider location/radiation/quality/duration/timing/severity/associated sxs/prior Treatment) The history is provided by the patient.   35 year old male comes in for psychiatric evaluation. He has depression which has significantly worsened over the last 2 weeks. He cannot to me any precipitating factor. He does admit to early morning wakening and anhedonia but denies crying spells. He denies hallucinations. He does admit to suicidal ideation with thoughts of shooting himself or taking a drug overdose. He does not have access to a gun but does have access to pills. He admits to marijuana use but denies other drug use. He admits to occasional ethanol use.  Past Medical History  Diagnosis Date  . Seizure disorder ([place])   . Asthma   . Hypertension   . Acute angina ([place])   . CAD (coronary artery disease)   . Seizures ([place])   . Diabetes mellitus without complication [facility])    Past Surgical History  Procedure Laterality Date  . Cholecystectomy    . Eye surgery    . Facial reconstruction surgery     Family History  Problem Relation Age of Onset  . Diabetes Mother   . Hypertension Mother   . Heart failure Father   . Stroke Father   . Diabetes Father   . Heart failure Brother    Social History  Substance Use Topics  . Smoking status: Current Every Day Smoker -- 0.50 packs/day for 20 years    Types: Cigarettes  . Smokeless tobacco: Never Used  . Alcohol Use: Yes     Comment: occasionally    Review of Systems  All other systems reviewed and are negative.     Allergies  Morphine and related; Penicillins; Tramadol; and Ibuprofen  Home Medications   Prior to Admission medications   Medication Sig Start Date End Date Taking? Authorizing Provider  aspirin 81 MG tablet Take 81 mg by mouth daily.   Yes  Historical Provider, MD  benazepril-hydrochlorthiazide (LOTENSIN HCT) 20-25 MG per tablet Take 1 tablet by mouth daily.   Yes Historical Provider, MD  divalproex (DEPAKOTE ER) 500 MG 24 hr tablet Take 500 mg by mouth daily.   Yes Historical Provider, MD  glimepiride (AMARYL) 4 MG tablet Take 4 mg by mouth daily with breakfast.   Yes Historical Provider, MD  nitroGLYCERIN (NITROSTAT) 0.4 MG SL tablet Place 0.4 mg under the tongue every 5 (five) minutes as needed for chest pain.    Yes Historical Provider, MD  phenytoin (DILANTIN) 100 MG ER capsule Take 300 mg by mouth every morning.     Yes Historical Provider, MD  albuterol (PROVENTIL) (2.5 MG/3ML) 0.083% nebulizer solution Take 3 mLs (2.5 mg total) by nebulization every 6 (six) hours as needed for wheezing or shortness of breath. 02/12/15   Tammy Triplett, PA-C  cyclobenzaprine (FLEXERIL) 10 MG tablet Take 1 tablet (10 mg total) by mouth 3 (three) times daily as needed. Patient not taking: Reported on 03/14/2015 10/08/14   Tammy Triplett, PA-C  guaiFENesin-codeine (ROBITUSSIN AC) 100-10 MG/5ML syrup Take 10 mLs by mouth 3 (three) times daily as needed. Patient not taking: Reported on 03/14/2015 02/12/15   Tammy Triplett, PA-C  predniSONE (DELTASONE) 10 MG tablet Take 6 tablets day one, 5 tablets day two, 4 tablets day three, 3 tablets day four, 2 tablets  day five, then 1 tablet day six Patient not taking: Reported on 03/14/2015 02/12/15   Tammy Triplett, PA-C   BP 146/96 mmHg  Pulse 85  Temp(Src) 97.8 F (36.6 C) (Oral)  Resp 18  Ht 6' (1.829 m)  Wt 250 lb (113.399 kg)  BMI 33.90 kg/m2  SpO2 100% Physical Exam  Nursing note and vitals reviewed.  35 year old male, resting comfortably and in no acute distress. Vital signs are significant for hypertension. Oxygen saturation is 100%, which is normal. Head is normocephalic and atraumatic. PERRLA, EOMI. Oropharynx is clear. Neck is nontender and supple without adenopathy or JVD. Back is  nontender and there is no CVA tenderness. Lungs are clear without rales, wheezes, or rhonchi. Chest is nontender. Heart has regular rate and rhythm without murmur. Abdomen is soft, flat, nontender without masses or hepatosplenomegaly and peristalsis is normoactive. Extremities have no cyanosis or edema, full range of motion is present. Skin is warm and dry without rash. Neurologic: Mental status is normal, cranial nerves are intact, there are no motor or sensory deficits. Psychiatric: Depressed affect. He makes poor eye contact and speaks in a monotone.  ED Course  Procedures (including critical care time) Labs Review Results for orders placed or performed during the hospital encounter of 05/10/15  Urine rapid drug screen (hosp performed)  Result Value Ref Range   Opiates NONE DETECTED NONE DETECTED   Cocaine NONE DETECTED NONE DETECTED   Benzodiazepines NONE DETECTED NONE DETECTED   Amphetamines NONE DETECTED NONE DETECTED   Tetrahydrocannabinol POSITIVE (A) NONE DETECTED   Barbiturates NONE DETECTED NONE DETECTED  CBC with Differential  Result Value Ref Range   WBC 11.0 (H) 4.0 - 10.5 K/uL   RBC 5.19 4.22 - 5.81 MIL/uL   Hemoglobin 16.6 13.0 - 17.0 g/dL   HCT 46.9 39.0 - 52.0 %   MCV 90.4 78.0 - 100.0 fL   MCH 32.0 26.0 - 34.0 pg   MCHC 35.4 30.0 - 36.0 g/dL   RDW 13.0 11.5 - 15.5 %   Platelets 248 150 - 400 K/uL   Neutrophils Relative % 58 %   Neutro Abs 6.5 1.7 - 7.7 K/uL   Lymphocytes Relative 35 %   Lymphs Abs 3.8 0.7 - 4.0 K/uL   Monocytes Relative 4 %   Monocytes Absolute 0.4 0.1 - 1.0 K/uL   Eosinophils Relative 2 %   Eosinophils Absolute 0.2 0.0 - 0.7 K/uL   Basophils Relative 1 %   Basophils Absolute 0.1 0.0 - 0.1 K/uL  Comprehensive metabolic panel  Result Value Ref Range   Sodium 139 135 - 145 mmol/L   Potassium 4.1 3.5 - 5.1 mmol/L   Chloride 107 101 - 111 mmol/L   CO2 23 22 - 32 mmol/L   Glucose, Bld 106 (H) 65 - 99 mg/dL   BUN 13 6 - 20 mg/dL    Creatinine, Ser 0.85 0.61 - 1.24 mg/dL   Calcium 9.4 8.9 - 10.3 mg/dL   Total Protein 8.1 6.5 - 8.1 g/dL   Albumin 4.2 3.5 - 5.0 g/dL   AST 22 15 - 41 U/L   ALT 35 17 - 63 U/L   Alkaline Phosphatase 89 38 - 126 U/L   Total Bilirubin 0.8 0.3 - 1.2 mg/dL   GFR calc non Af Amer >60 >60 mL/min   GFR calc Af Amer >60 >60 mL/min   Anion gap 9 5 - 15  Ethanol  Result Value Ref Range   Alcohol, Ethyl (B) <5 <  5 mg/dL  Valproic acid level  Result Value Ref Range   Valproic Acid Lvl 115 (H) 50.0 - 100.0 ug/mL  Phenytoin level, total  Result Value Ref Range   Phenytoin Lvl <2.5 (L) 10.0 - 20.0 ug/mL   I have personally reviewed and evaluated these images and lab results as part of my medical decision-making.  MDM   Final diagnoses:  Depression  Suicidal ideation    Major depression with suicidal ideation. TTS consultation will be obtained. Old records are reviewed and he has no relevant past visits.  TTS consultation is appreciated. Patient denied suicidal thoughts to Education officer, museum and states that he was only coming here to [place] his girlfriend. This is distinctly different from my history. I told patient that I wanted him to stay so that psychiatry evaluation to be done in the morning. At that point, he became belligerent and stated he would not stay. Therefore, he is placed under involuntary commitment until psychiatric evaluation can be obtained. He has gone down of the treatment area and security was needed to corral around him.  [name], MD [phone]

## 2015-05-11 NOTE — Consult Note (Signed)
Telepsych Consultation   Reason for Consult:  Substance abuse, suicidal thoughts, no plan Referring Physician:  EDP Patient Identification: Chad Hampton MRN:  [id number] Principal Diagnosis: MDD (major depressive disorder), recurrent severe, without psychosis ([place]) Diagnosis:   Patient Active Problem List   Diagnosis Date Noted  . MDD (major depressive disorder), recurrent severe, without psychosis ([place]) [F33.2] 05/11/2015    Priority: High  . Cannabis abuse [F12.10] 05/11/2015    Priority: High  . Substance induced mood disorder [facility]) [F19.94] 05/11/2015    Priority: High    Total Time spent with patient: 45 minutes  Subjective:   Chad Hampton is a 35 y.o. male patient admitted with reports of worsening depression and thoughts of suicide. Pt seen and chart reviewed. Pt is alert/oriented x4, calm, cooperative, and appropriate to situation. Pt denies suicidal/homicidal ideation and psychosis and does not appear to be responding to internal stimuli. Pt reports that he smokes THC daily and that he was smoking less and became more anxious, texting statements to his girlfriend about wanting to harm himself 2 weeks ago. He said she keeps asking him to come to the ED, at which he reports he wanted to go to [facility] for treatment instead. He came to the ED per her request. He reports that he would like to go to [facility] if possible and that he has not had any suicidal thoughts in the past 2 weeks or since he has been self-medicating for anxiety (with THC).   HPI:  I have reviewed and concur with HPI elements, modified as follows: Chad Hampton is an 35 y.o. male.  -Clinician reviewed note by Dr. Roxanne Mins. Patient was brought in by fiance because of worsening depression and thoughts of suicide. Dr. Roxanne Mins said patient said that he had thoughts of getting a gun to shoot himself. Patient had reportedly told the triage nurse that he had thoughts about overdosing.  Patient said that he did not want to  come in but went ahead and did for his fiance. He says he had offered to go to [facility]) but she insisted on bringing him to [place]. Patient admits to texting his girlfriend recently and telling her he was going to overdose or shoot self. Patient quickly says "I said ha-ha after it to show I wasn't serious." Patient is currently denying any SI intention or plan. He denies any past suicide attempts. Patient says that he cannot have access to a gun because he is a felon. Patient denies any HI or A/V hallucinations.  Patient says he and fiance have some disagreements about moving out on their own. Patient says that currently they are living with his brother & sister in law and their children and patient's mother. Patient denies any past mental health history. Patient reports using marijuana at least twice per week with yesterday being the last incident.  Patient says he is willing to go to [facility] in [place]. He expresses some apprehension and want to leave. Clinician talked with Dr. Roxanne Mins who said that he would IVC patient if he wants to leave. He will talk to patient about staying to be seen by psychiatry in AM. Clinician discussed patient disposition with Chad Clan, PA. He recommended patient be seen by psychiatry in AM on 01/12.   Past Medical History:  Past Medical History  Diagnosis Date  . Seizure disorder ([place])   . Asthma   . Hypertension   . Acute angina ([place])   . CAD (coronary artery disease)   .  Seizures ([place])   . Diabetes mellitus without complication [facility])     Past Surgical History  Procedure Laterality Date  . Cholecystectomy    . Eye surgery    . Facial reconstruction surgery     Family History:  Family History  Problem Relation Age of Onset  . Diabetes Mother   . Hypertension Mother   . Heart failure Father   . Stroke Father   . Diabetes Father   . Heart failure Brother    Social History:  History  Alcohol Use  . Yes    Comment:  occasionally     History  Drug Use  . Yes  . Special: Marijuana    Social History   Social History  . Marital Status: Legally Separated    Spouse Name: N/A  . Number of Children: N/A  . Years of Education: N/A   Social History Main Topics  . Smoking status: Current Every Day Smoker -- 0.50 packs/day for 20 years    Types: Cigarettes  . Smokeless tobacco: Never Used  . Alcohol Use: Yes     Comment: occasionally  . Drug Use: Yes    Special: Marijuana  . Sexual Activity: No   Other Topics Concern  . None   Social History Narrative   Additional Social History:    Pain Medications: See PTA medication list Prescriptions: See PTA medication list; Pt says he is on a different diabetes medication since November but he cannot remember the name. Over the Counter: N/A History of alcohol / drug use?: Yes Name of Substance 1: Marijuana 1 - Age of First Use: Teens 1 - Amount (size/oz): A bowl at a time 1 - Frequency: Twice in a week on average 1 - Duration: On-going 1 - Last Use / Amount: 01/11                   Allergies:   Allergies  Allergen Reactions  . Morphine And Related Shortness Of Breath  . Penicillins Other (See Comments)    Pt states high severity reaction but unknown since childhood  . Tramadol     Seizures   . Ibuprofen Rash    Labs:  Results for orders placed or performed during the hospital encounter of 05/10/15 (from the past 48 hour(s))  CBC with Differential     Status: Abnormal   Collection Time: 05/11/15 12:15 AM  Result Value Ref Range   WBC 11.0 (H) 4.0 - 10.5 K/uL   RBC 5.19 4.22 - 5.81 MIL/uL   Hemoglobin 16.6 13.0 - 17.0 g/dL   HCT 46.9 39.0 - 52.0 %   MCV 90.4 78.0 - 100.0 fL   MCH 32.0 26.0 - 34.0 pg   MCHC 35.4 30.0 - 36.0 g/dL   RDW 13.0 11.5 - 15.5 %   Platelets 248 150 - 400 K/uL   Neutrophils Relative % 58 %   Neutro Abs 6.5 1.7 - 7.7 K/uL   Lymphocytes Relative 35 %   Lymphs Abs 3.8 0.7 - 4.0 K/uL   Monocytes Relative  4 %   Monocytes Absolute 0.4 0.1 - 1.0 K/uL   Eosinophils Relative 2 %   Eosinophils Absolute 0.2 0.0 - 0.7 K/uL   Basophils Relative 1 %   Basophils Absolute 0.1 0.0 - 0.1 K/uL  Comprehensive metabolic panel     Status: Abnormal   Collection Time: 05/11/15 12:15 AM  Result Value Ref Range   Sodium 139 135 - 145 mmol/L   Potassium 4.1  3.5 - 5.1 mmol/L   Chloride 107 101 - 111 mmol/L   CO2 23 22 - 32 mmol/L   Glucose, Bld 106 (H) 65 - 99 mg/dL   BUN 13 6 - 20 mg/dL   Creatinine, Ser 0.85 0.61 - 1.24 mg/dL   Calcium 9.4 8.9 - 10.3 mg/dL   Total Protein 8.1 6.5 - 8.1 g/dL   Albumin 4.2 3.5 - 5.0 g/dL   AST 22 15 - 41 U/L   ALT 35 17 - 63 U/L   Alkaline Phosphatase 89 38 - 126 U/L   Total Bilirubin 0.8 0.3 - 1.2 mg/dL   GFR calc non Af Amer >60 >60 mL/min   GFR calc Af Amer >60 >60 mL/min    Comment: (NOTE) The eGFR has been calculated using the CKD EPI equation. This calculation has not been validated in all clinical situations. eGFR's persistently <60 mL/min signify possible Chronic Kidney Disease.    Anion gap 9 5 - 15  Ethanol     Status: None   Collection Time: 05/11/15 12:15 AM  Result Value Ref Range   Alcohol, Ethyl (B) <5 <5 mg/dL    Comment:        LOWEST DETECTABLE LIMIT FOR SERUM ALCOHOL IS 5 mg/dL FOR MEDICAL PURPOSES ONLY   Valproic acid level     Status: Abnormal   Collection Time: 05/11/15 12:18 AM  Result Value Ref Range   Valproic Acid Lvl 115 (H) 50.0 - 100.0 ug/mL    Comment: RESULTS CONFIRMED BY MANUAL DILUTION  Phenytoin level, total     Status: Abnormal   Collection Time: 05/11/15 12:18 AM  Result Value Ref Range   Phenytoin Lvl <2.5 (L) 10.0 - 20.0 ug/mL  Urine rapid drug screen (hosp performed)     Status: Abnormal   Collection Time: 05/11/15  2:34 AM  Result Value Ref Range   Opiates NONE DETECTED NONE DETECTED   Cocaine NONE DETECTED NONE DETECTED   Benzodiazepines NONE DETECTED NONE DETECTED   Amphetamines NONE DETECTED NONE DETECTED    Tetrahydrocannabinol POSITIVE (A) NONE DETECTED   Barbiturates NONE DETECTED NONE DETECTED    Comment:        DRUG SCREEN FOR MEDICAL PURPOSES ONLY.  IF CONFIRMATION IS NEEDED FOR ANY PURPOSE, NOTIFY LAB WITHIN 5 DAYS.        LOWEST DETECTABLE LIMITS FOR URINE DRUG SCREEN Drug Class       Cutoff (ng/mL) Amphetamine      1000 Barbiturate      200 Benzodiazepine   716 Tricyclics       967 Opiates          300 Cocaine          300 THC              50   CBG monitoring, ED     Status: Abnormal   Collection Time: 05/11/15 11:46 AM  Result Value Ref Range   Glucose-Capillary 55 (L) 65 - 99 mg/dL    Vitals: Blood pressure 138/87, pulse 81, temperature 97.6 F (36.4 C), temperature source Oral, resp. rate 16, height 6' (1.829 m), weight 113.399 kg (250 lb), SpO2 99 %.  Risk to Self: Suicidal Ideation: No-Not Currently/Within Last 6 Months Suicidal Intent: No-Not Currently/Within Last 6 Months Is patient at risk for suicide?: Yes Suicidal Plan?: No-Not Currently/Within Last 6 Months Access to Means: Yes Specify Access to Suicidal Means: Gun What has been your use of drugs/alcohol within the last 12 months?: Marijuana  How many times?: 0 Other Self Harm Risks: None Triggers for Past Attempts: None known Intentional Self Injurious Behavior: None Risk to Others: Homicidal Ideation: No Thoughts of Harm to Others: No Current Homicidal Intent: No Current Homicidal Plan: No Access to Homicidal Means: No Identified Victim: No one History of harm to others?: No Assessment of Violence: None Noted Violent Behavior Description: None Does patient have access to weapons?: Yes (Comment) (Does not have access to a gun (felon)) Criminal Charges Pending?: No Does patient have a court date: No Prior Inpatient Therapy: Prior Inpatient Therapy: No Prior Therapy Dates: N/A Prior Therapy Facilty/Provider(s): N/A Reason for Treatment: N/A Prior Outpatient Therapy: Prior Outpatient Therapy:  No Prior Therapy Dates: None Prior Therapy Facilty/Provider(s): None Reason for Treatment: None Does patient have an ACCT team?: No Does patient have Intensive In-House Services?  : No Does patient have [facility] services? : No Does patient have P4CC services?: No  Current Facility-Administered Medications  Medication Dose Route Frequency Provider Last Rate Last Dose  . acetaminophen (TYLENOL) tablet 650 mg  650 mg Oral A1P PRN [name], MD      . albuterol (PROVENTIL) (2.5 MG/3ML) 0.083% nebulizer solution 2.5 mg  2.5 mg Nebulization F7T PRN [name], MD      . alum & mag hydroxide-simeth (MAALOX/MYLANTA) 200-200-20 MG/5ML suspension 30 mL  30 mL Oral PRN [name], MD      . aspirin chewable tablet 81 mg  81 mg Oral Daily [name], MD   81 mg at 02/40/97 0934  . divalproex (DEPAKOTE ER) 24 hr tablet 500 mg  500 mg Oral Daily [name], MD   353 mg at 05/11/15 0934  . glimepiride (AMARYL) tablet 4 mg  4 mg Oral Q breakfast Kathie Dike, MD   4 mg at 05/11/15 0934  . LORazepam (ATIVAN) tablet 1 mg  1 mg Oral G9J PRN [name], MD      . nicotine (NICODERM CQ - dosed in mg/24 hr) patch 7 mg  7 mg Transdermal Daily [name], MD   7 mg at 24/26/83 0935  . ondansetron (ZOFRAN) tablet 4 mg  4 mg Oral M1D PRN [name], MD      . phenytoin (DILANTIN) ER capsule 300 mg  300 mg Oral QQ-I2L David Glick, MD   798 mg at 05/11/15 0935  . ziprasidone (GEODON) injection 20 mg  20 mg Intramuscular Once [name], MD      . zolpidem [facility]) tablet 5 mg  5 mg Oral QHS PRN [name], MD       Current Outpatient Prescriptions  Medication Sig Dispense Refill  . albuterol (PROVENTIL) (2.5 MG/3ML) 0.083% nebulizer solution Take 3 mLs (2.5 mg total) by nebulization every 6 (six) hours as needed for wheezing or shortness of breath. 75 mL 0  . aspirin 81 MG tablet Take 81 mg by mouth daily.    . benazepril-hydrochlorthiazide (LOTENSIN HCT) 20-25 MG per tablet Take 1 tablet by mouth daily.     . diclofenac (VOLTAREN) 75 MG EC tablet Take 75 mg by mouth 2 (two) times daily.    . divalproex (DEPAKOTE ER) 500 MG 24 hr tablet Take 500 mg by mouth daily.    Marland Kitchen glimepiride (AMARYL) 4 MG tablet Take 4 mg by mouth 2 (two) times daily.     . metFORMIN (GLUCOPHAGE) 500 MG tablet Take 500 mg by mouth 2 (two) times daily with a meal.    . metoprolol tartrate (LOPRESSOR) 25 MG tablet  Take 25 mg by mouth 2 (two) times daily.    . nitroGLYCERIN (NITROSTAT) 0.4 MG SL tablet Place 0.4 mg under the tongue every 5 (five) minutes as needed for chest pain.     Marland Kitchen omeprazole (PRILOSEC) 20 MG capsule Take 20 mg by mouth daily.    . phenytoin (DILANTIN) 100 MG ER capsule Take 300 mg by mouth every morning.      . cyclobenzaprine (FLEXERIL) 10 MG tablet Take 1 tablet (10 mg total) by mouth 3 (three) times daily as needed. (Patient not taking: Reported on 03/14/2015) 21 tablet 0  . guaiFENesin-codeine (ROBITUSSIN AC) 100-10 MG/5ML syrup Take 10 mLs by mouth 3 (three) times daily as needed. (Patient not taking: Reported on 03/14/2015) 100 mL 0  . predniSONE (DELTASONE) 10 MG tablet Take 6 tablets day one, 5 tablets day two, 4 tablets day three, 3 tablets day four, 2 tablets day five, then 1 tablet day six (Patient not taking: Reported on 03/14/2015) 21 tablet 0    Musculoskeletal: UTO, camera  Psychiatric Specialty Exam:  Physical Exam  Review of Systems  Psychiatric/Behavioral: Positive for depression and substance abuse. Negative for suicidal ideas and hallucinations. The patient is nervous/anxious and has insomnia.   All other systems reviewed and are negative.   Blood pressure 138/87, pulse 81, temperature 97.6 F (36.4 C), temperature source Oral, resp. rate 16, height 6' (1.829 m), weight 113.399 kg (250 lb), SpO2 99 %.Body mass index is 33.9 kg/(m^2).  General Appearance: Casual and Fairly Groomed  Engineer, water::  Good  Speech:  Clear and Coherent and Normal Rate  Volume:  Normal  Mood:  Anxious   Affect:  Appropriate and Congruent  Thought Process:  Coherent and Goal Directed  Orientation:  Full (Time, Place, and Person)  Thought Content:  WDL  Suicidal Thoughts:  No  Homicidal Thoughts:  No  Memory:  Immediate;   Fair Recent;   Fair Remote;   Fair  Judgement:  Fair  Insight:  Fair  Psychomotor Activity:  Normal  Concentration:  Fair  Recall:  [employer] [place]  Language: Fair  Akathisia:  No  Handed:    AIMS (if indicated):     Assets:  Communication Skills Desire for Improvement Physical Health Resilience Social Support  ADL's:  Intact  Cognition: WNL  Sleep:      Treatment Plan Summary: -Refer to outpatient  Disposition:  -Discharge home -Refer to [facility] for substance abuse and counseling/psychiatry  Quaran, Kedzierski, FNP-BC 05/11/2015 10:49AM

## 2015-05-11 NOTE — Discharge Instructions (Signed)
Cleared by behavioral health for discharge home they recommend follow-up with day Mark. For your great toe nail that is loose with recommending covering it with a Band-Aid soaking it for at least 20 minutes in warm water each day follow-up for any new or worse symptoms. After soaking reapply the Band-Aid.

## 2015-05-11 NOTE — ED Notes (Signed)
Pt c/o feeling hot.  Checking glucose.

## 2015-05-11 NOTE — ED Notes (Signed)
TTS completed. 

## 2015-05-11 NOTE — ED Notes (Signed)
Patient was swinging remote control cord around, and I told him I needed to take it because he is on SI precautions, he threw the remote behind him and was angry about it situation.

## 2015-05-11 NOTE — ED Notes (Signed)
Pts girlfriend left at this time, she requested to be notified of disposition when known. I asked the patient and he gave permission for staff to speak with her regarding his treatment and plan of care. Her contact information is as follows:   Myrtie Cruise  [phone]

## 2015-05-11 NOTE — ED Notes (Signed)
Patient became upset about staying overnight. Pt left ED main area but was stopped by security,  [employer] called. Dr. Roxanne Mins filled out IVC paper, after patient walked back to room 3

## 2015-05-11 NOTE — ED Notes (Signed)
Pt c/o split toe nail.  Dr. Rogene Houston notified.  States he will go see pt.

## 2015-05-11 NOTE — ED Notes (Signed)
Patient given meal. 

## 2015-05-11 NOTE — BH Assessment (Signed)
Tele Assessment Note   Chad Hampton is an 35 y.o. male.  -Clinician reviewed note by Dr. Roxanne Mins.  Patient was brought in by fiance because of worsening depression and thoughts of suicide.  Dr. Roxanne Mins said patient said that he had thoughts of getting a gun to shoot himself.  Patient had reportedly told the triage nurse that he had thoughts about overdosing.  Patient said that he did not want to come in but went ahead and did for his fiance.  He says he had offered to go to [facility]) but she insisted on bringing him to [place].  Patient admits to texting his girlfriend recently and telling her he was going to overdose or shoot self.  Patient quickly says "I said ha-ha after it to show I wasn't serious."  Patient is currently denying any SI intention or plan.  He denies any past suicide attempts.  Patient says that he cannot have access to a gun because he is a felon.  Patient denies any HI or A/V hallucinations.  Patient says he and fiance have some disagreements about moving out on their own.  Patient says that currently they are living with his brother & sister in law and their children and patient's mother.  Patient denies any past mental health history.  Patient reports using marijuana at least twice per week with yesterday being the last incident.  Patient says he is willing to go to [facility] in [place].  He expresses some apprehension and want to leave.  Clinician talked with Dr. Roxanne Mins who said that he would IVC patient if he wants to leave.  He will talk to patient about staying to be seen by psychiatry in AM.  Clinician discussed patient disposition with Patriciaann Clan, PA.  He recommended patient be seen by psychiatry in AM on 01/12.  Diagnosis: MDD, single episode  Past Medical History:  Past Medical History  Diagnosis Date  . Seizure disorder ([place])   . Asthma   . Hypertension   . Acute angina ([place])   . CAD (coronary artery disease)   . Seizures ([place])   . Diabetes mellitus  without complication [facility])     Past Surgical History  Procedure Laterality Date  . Cholecystectomy    . Eye surgery    . Facial reconstruction surgery      Family History:  Family History  Problem Relation Age of Onset  . Diabetes Mother   . Hypertension Mother   . Heart failure Father   . Stroke Father   . Diabetes Father   . Heart failure Brother     Social History:  reports that he has been smoking Cigarettes.  He has a 10 pack-year smoking history. He has never used smokeless tobacco. He reports that he drinks alcohol. He reports that he uses illicit drugs (Marijuana).  Additional Social History:  Alcohol / Drug Use Pain Medications: See PTA medication list Prescriptions: See PTA medication list; Pt says he is on a different diabetes medication since November but he cannot remember the name. Over the Counter: N/A History of alcohol / drug use?: Yes Substance #1 Name of Substance 1: Marijuana 1 - Age of First Use: Teens 1 - Amount (size/oz): A bowl at a time 1 - Frequency: Twice in a week on average 1 - Duration: On-going 1 - Last Use / Amount: 01/11  CIWA: CIWA-Ar BP: 146/96 mmHg Pulse Rate: 85 COWS:    PATIENT STRENGTHS: (choose at least two) Average or above average intelligence  Capable of independent living Communication skills Supportive family/friends  Allergies:  Allergies  Allergen Reactions  . Morphine And Related Shortness Of Breath  . Penicillins Other (See Comments)    Pt states high severity reaction but unknown since childhood  . Tramadol     Seizures   . Ibuprofen Rash    Home Medications:  (Not in a hospital admission)  OB/GYN Status:  No LMP for male patient.  General Assessment Data Location of Assessment: [facility] TTS Assessment: In system Is this a Tele or Face-to-Face Assessment?: Tele Assessment Is this an Initial Assessment or a Re-assessment for this encounter?: Initial Assessment Marital status: Divorced Is patient  pregnant?: No Pregnancy Status: No Living Arrangements: Spouse/significant other, Other relatives Can pt return to current living arrangement?: Yes Admission Status: Voluntary Is patient capable of signing voluntary admission?: Yes Referral Source: Self/Family/Friend Insurance type: MCR/ MCD     Crisis Care Plan Living Arrangements: Spouse/significant other, Other relatives Name of Psychiatrist: None Name of Therapist: None  Education Status Is patient currently in school?: No Highest grade of school patient has completed: 12th grade  Risk to self with the past 6 months Suicidal Ideation: No-Not Currently/Within Last 6 Months Has patient been a risk to self within the past 6 months prior to admission? : No Suicidal Intent: No-Not Currently/Within Last 6 Months Has patient had any suicidal intent within the past 6 months prior to admission? : No Is patient at risk for suicide?: Yes Suicidal Plan?: No-Not Currently/Within Last 6 Months Has patient had any suicidal plan within the past 6 months prior to admission? : Yes Access to Means: Yes Specify Access to Suicidal Means: Gun What has been your use of drugs/alcohol within the last 12 months?: Marijuana  Previous Attempts/Gestures: No How many times?: 0 Other Self Harm Risks: None Triggers for Past Attempts: None known Intentional Self Injurious Behavior: None Family Suicide History: No Recent stressful life event(s): Conflict (Comment), Financial Problems (Some arguments with fiance.) Persecutory voices/beliefs?: No Depression: No Depression Symptoms:  (Denies depressive symptoms) Substance abuse history and/or treatment for substance abuse?: Yes Suicide prevention information given to non-admitted patients: Not applicable  Risk to Others within the past 6 months Homicidal Ideation: No Does patient have any lifetime risk of violence toward others beyond the six months prior to admission? : No Thoughts of Harm to Others:  No Current Homicidal Intent: No Current Homicidal Plan: No Access to Homicidal Means: No Identified Victim: No one History of harm to others?: No Assessment of Violence: None Noted Violent Behavior Description: None Does patient have access to weapons?: Yes (Comment) (Does not have access to a gun (felon)) Criminal Charges Pending?: No Does patient have a court date: No Is patient on probation?: No  Psychosis Hallucinations: None noted Delusions: None noted  Mental Status Report Appearance/Hygiene: Disheveled, In scrubs Eye Contact: Good Motor Activity: Freedom of movement, Unremarkable Speech: Logical/coherent Level of Consciousness: Alert Mood: Apprehensive Affect: Apprehensive Anxiety Level: None Thought Processes: Coherent, Relevant Judgement: Unimpaired Orientation: Person, Place, Time, Situation Obsessive Compulsive Thoughts/Behaviors: None  Cognitive Functioning Concentration: Normal Memory: Recent Intact, Remote Intact IQ: Average Insight: Fair Impulse Control: Good Appetite: Fair Weight Loss: 0 Weight Gain: 0 Sleep: No Change Total Hours of Sleep:  (Pt sleeps in daytime and will be up all night.) Vegetative Symptoms: None  ADLScreening [facility] Assessment Services) Patient's cognitive ability adequate to safely complete daily activities?: Yes Patient able to express need for assistance with ADLs?: Yes Independently performs ADLs?: Yes (appropriate  for developmental age)  Prior Inpatient Therapy Prior Inpatient Therapy: No Prior Therapy Dates: N/A Prior Therapy Facilty/Provider(s): N/A Reason for Treatment: N/A  Prior Outpatient Therapy Prior Outpatient Therapy: No Prior Therapy Dates: None Prior Therapy Facilty/Provider(s): None Reason for Treatment: None Does patient have an ACCT team?: No Does patient have Intensive In-House Services?  : No Does patient have [facility] services? 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.) Does the patient have difficulty concentrating, remembering, or making decisions?: No Patient able to express need for assistance with ADLs?: Yes Does the patient have difficulty dressing or bathing?: No Independently performs ADLs?: Yes (appropriate for developmental age) Does the patient have difficulty walking or climbing stairs?: No Weakness of Legs: None Weakness of Arms/Hands: None       Abuse/Neglect Assessment (Assessment to be complete while patient is alone) Physical Abuse: Denies Verbal Abuse: Denies Sexual Abuse: Denies Exploitation of patient/patient's resources: Denies Self-Neglect: Denies     Regulatory affairs officer (For Healthcare) Does patient have an advance directive?: No Would patient like information on creating an advanced directive?: No - patient declined information    Additional Information 1:1 In Past 12 Months?: No CIRT Risk: No Elopement Risk: No Does patient have medical clearance?: Yes     Disposition:  Disposition Initial Assessment Completed for this Encounter: Yes Disposition of Patient: Other dispositions Other disposition(s): Other (Comment) (Pt to be reviewed with PA)  Curlene Dolphin Ray 05/11/2015 3:31 AM

## 2015-05-16 DIAGNOSIS — G40802 Other epilepsy, not intractable, without status epilepticus: Secondary | ICD-10-CM | POA: Diagnosis not present

## 2015-06-06 ENCOUNTER — Emergency Department (HOSPITAL_COMMUNITY)
Admission: EM | Admit: 2015-06-06 | Discharge: 2015-06-07 | Disposition: A | Discharge status: Home / Self Care | Payer: Medicare Other | Attending: Emergency Medicine | Admitting: Emergency Medicine

## 2015-06-06 ENCOUNTER — Encounter (HOSPITAL_COMMUNITY): Payer: Self-pay | Admitting: *Deleted

## 2015-06-06 DIAGNOSIS — Z79899 Other long term (current) drug therapy: Secondary | ICD-10-CM | POA: Insufficient documentation

## 2015-06-06 DIAGNOSIS — E119 Type 2 diabetes mellitus without complications: Secondary | ICD-10-CM | POA: Diagnosis not present

## 2015-06-06 DIAGNOSIS — Z880 Allergy status to penicillin: Secondary | ICD-10-CM | POA: Insufficient documentation

## 2015-06-06 DIAGNOSIS — G8929 Other chronic pain: Secondary | ICD-10-CM | POA: Insufficient documentation

## 2015-06-06 DIAGNOSIS — I10 Essential (primary) hypertension: Secondary | ICD-10-CM | POA: Diagnosis not present

## 2015-06-06 DIAGNOSIS — J029 Acute pharyngitis, unspecified: Secondary | ICD-10-CM | POA: Diagnosis present

## 2015-06-06 DIAGNOSIS — J45909 Unspecified asthma, uncomplicated: Secondary | ICD-10-CM | POA: Diagnosis not present

## 2015-06-06 DIAGNOSIS — Z8669 Personal history of other diseases of the nervous system and sense organs: Secondary | ICD-10-CM | POA: Diagnosis not present

## 2015-06-06 DIAGNOSIS — Z7982 Long term (current) use of aspirin: Secondary | ICD-10-CM | POA: Insufficient documentation

## 2015-06-06 DIAGNOSIS — F17210 Nicotine dependence, cigarettes, uncomplicated: Secondary | ICD-10-CM | POA: Diagnosis not present

## 2015-06-06 DIAGNOSIS — Z7984 Long term (current) use of oral hypoglycemic drugs: Secondary | ICD-10-CM | POA: Insufficient documentation

## 2015-06-06 DIAGNOSIS — Z7901 Long term (current) use of anticoagulants: Secondary | ICD-10-CM | POA: Insufficient documentation

## 2015-06-06 DIAGNOSIS — I2510 Atherosclerotic heart disease of native coronary artery without angina pectoris: Secondary | ICD-10-CM | POA: Diagnosis not present

## 2015-06-06 DIAGNOSIS — M545 Low back pain: Secondary | ICD-10-CM | POA: Diagnosis not present

## 2015-06-06 DIAGNOSIS — M549 Dorsalgia, unspecified: Secondary | ICD-10-CM

## 2015-06-06 DIAGNOSIS — J020 Streptococcal pharyngitis: Secondary | ICD-10-CM | POA: Diagnosis not present

## 2015-06-06 MED ORDER — ACETAMINOPHEN 500 MG PO TABS
1000.0000 mg | ORAL_TABLET | Freq: Once | ORAL | Status: AC
  Administered 2015-06-07: 1000 mg via ORAL
  Filled 2015-06-06: qty 2

## 2015-06-06 MED ORDER — ONDANSETRON 4 MG PO TBDP
4.0000 mg | ORAL_TABLET | Freq: Once | ORAL | Status: AC
  Administered 2015-06-07: 4 mg via ORAL
  Filled 2015-06-06: qty 1

## 2015-06-06 NOTE — ED Notes (Signed)
Pt c/o sore throat and nausea that started today

## 2015-06-07 DIAGNOSIS — J020 Streptococcal pharyngitis: Secondary | ICD-10-CM | POA: Diagnosis not present

## 2015-06-07 LAB — RAPID STREP SCREEN (MED CTR MEBANE ONLY): STREPTOCOCCUS, GROUP A SCREEN (DIRECT): POSITIVE — AB

## 2015-06-07 MED ORDER — CLINDAMYCIN HCL 150 MG PO CAPS
ORAL_CAPSULE | ORAL | Status: AC
  Filled 2015-06-07: qty 2

## 2015-06-07 MED ORDER — CLINDAMYCIN HCL 150 MG PO CAPS
300.0000 mg | ORAL_CAPSULE | Freq: Once | ORAL | Status: AC
  Administered 2015-06-07: 300 mg via ORAL

## 2015-06-07 NOTE — Discharge Instructions (Signed)
Chronic Back Pain  When back pain lasts longer than 3 months, it is called chronic back pain.People with chronic back pain often go through certain periods that are more intense (flare-ups).  CAUSES Chronic back pain can be caused by wear and tear (degeneration) on different structures in your back. These structures include:  The bones of your spine (vertebrae) and the joints surrounding your spinal cord and nerve roots (facets).  The strong, fibrous tissues that connect your vertebrae (ligaments). Degeneration of these structures may result in pressure on your nerves. This can lead to constant pain. HOME CARE INSTRUCTIONS  Avoid bending, heavy lifting, prolonged sitting, and activities which make the problem worse.  Take brief periods of rest throughout the day to reduce your pain. Lying down or standing usually is better than sitting while you are resting.  Take over-the-counter or prescription medicines only as directed by your caregiver. SEEK IMMEDIATE MEDICAL CARE IF:   You have weakness or numbness in one of your legs or feet.  You have trouble controlling your bladder or bowels.  You have nausea, vomiting, abdominal pain, shortness of breath, or fainting.   This information is not intended to replace advice given to you by your health care provider. Make sure you discuss any questions you have with your health care provider.   Document Released: 05/23/2004 Document Revised: 07/08/2011 Document Reviewed: 10/03/2014 Elsevier Interactive Patient Education 2016 Elsevier Inc.  Strep Throat Strep throat is a bacterial infection of the throat. Your health care provider may call the infection tonsillitis or pharyngitis, depending on whether there is swelling in the tonsils or at the back of the throat. Strep throat is most common during the cold months of the year in children who are 67-21 years of age, but it can happen during any season in people of any age. This infection is spread  from person to person (contagious) through coughing, sneezing, or close contact. CAUSES Strep throat is caused by the bacteria called Streptococcus pyogenes. RISK FACTORS This condition is more likely to develop in:  People who spend time in crowded places where the infection can spread easily.  People who have close contact with someone who has strep throat. SYMPTOMS Symptoms of this condition include:  Fever or chills.   Redness, swelling, or pain in the tonsils or throat.  Pain or difficulty when swallowing.  White or yellow spots on the tonsils or throat.  Swollen, tender glands in the neck or under the jaw.  Red rash all over the body (rare). DIAGNOSIS This condition is diagnosed by performing a rapid strep test or by taking a swab of your throat (throat culture test). Results from a rapid strep test are usually ready in a few minutes, but throat culture test results are available after one or two days. TREATMENT This condition is treated with antibiotic medicine. HOME CARE INSTRUCTIONS Medicines  Take over-the-counter and prescription medicines only as told by your health care provider.  Take your antibiotic as told by your health care provider. Do not stop taking the antibiotic even if you start to feel better.  Have family members who also have a sore throat or fever tested for strep throat. They may need antibiotics if they have the strep infection. Eating and Drinking  Do not share food, drinking cups, or personal items that could cause the infection to spread to other people.  If swallowing is difficult, try eating soft foods until your sore throat feels better.  Drink enough fluid to keep  your urine clear or pale yellow. General Instructions  Gargle with a salt-water mixture 3-4 times per day or as needed. To make a salt-water mixture, completely dissolve -1 tsp of salt in 1 cup of warm water.  Make sure that all household members wash their hands  well.  Get plenty of rest.  Stay home from school or work until you have been taking antibiotics for 24 hours.  Keep all follow-up visits as told by your health care provider. This is important. SEEK MEDICAL CARE IF:  The glands in your neck continue to get bigger.  You develop a rash, cough, or earache.  You cough up a thick liquid that is green, yellow-brown, or bloody.  You have pain or discomfort that does not get better with medicine.  Your problems seem to be getting worse rather than better.  You have a fever. SEEK IMMEDIATE MEDICAL CARE IF:  You have new symptoms, such as vomiting, severe headache, stiff or painful neck, chest pain, or shortness of breath.  You have severe throat pain, drooling, or changes in your voice.  You have swelling of the neck, or the skin on the neck becomes red and tender.  You have signs of dehydration, such as fatigue, dry mouth, and decreased urination.  You become increasingly sleepy, or you cannot wake up completely.  Your joints become red or painful.   This information is not intended to replace advice given to you by your health care provider. Make sure you discuss any questions you have with your health care provider.   Document Released: 04/12/2000 Document Revised: 01/04/2015 Document Reviewed: 08/08/2014 Elsevier Interactive Patient Education [employer].

## 2015-06-07 NOTE — ED Provider Notes (Signed)
By signing my name below, I, Helane Gunther, attest that this documentation has been prepared under the direction and in the presence of [employer], DO. Electronically Signed: Helane Gunther, ED Scribe. 06/07/2015. 12:06 AM.  TIME SEEN: 12:06 AM  CHIEF COMPLAINT: Generalized Body Aches   HPI: Chad Hampton is a 35 y.o. male who presents to the Emergency Department complaining of generalized body aches onset.  He reports associated sore throat, nausea, chills that started within the past 24 hours. No headache or neck pain. No cough. No vomiting or diarrhea. No rash.    Patient also complains of chronic lower back pain that he has had for several years. No new back injury. Back pain is unchanged compared to prior. No numbness, tingling or focal weakness. No bowel or bladder incontinence.  ROS: See HPI Constitutional:  fever  Eyes: no drainage  ENT: no runny nose   Cardiovascular:  no chest pain  Resp: no SOB  GI: no vomiting GU: no dysuria Integumentary: no rash  Allergy: no hives  Musculoskeletal: no leg swelling  Neurological: no slurred speech ROS otherwise negative  PAST MEDICAL HISTORY/PAST SURGICAL HISTORY:  Past Medical History  Diagnosis Date  . Seizure disorder ([place])   . Asthma   . Hypertension   . Acute angina ([place])   . CAD (coronary artery disease)   . Seizures ([place])   . Diabetes mellitus without complication ([place])     MEDICATIONS:  Prior to Admission medications   Medication Sig Start Date End Date Taking? Authorizing Provider  albuterol (PROVENTIL) (2.5 MG/3ML) 0.083% nebulizer solution Take 3 mLs (2.5 mg total) by nebulization every 6 (six) hours as needed for wheezing or shortness of breath. 02/12/15   Tammy Triplett, PA-C  aspirin 81 MG tablet Take 81 mg by mouth daily.    Historical Provider, MD  benazepril-hydrochlorthiazide (LOTENSIN HCT) 20-25 MG per tablet Take 1 tablet by mouth daily.    Historical Provider, MD  cyclobenzaprine (FLEXERIL) 10 MG  tablet Take 1 tablet (10 mg total) by mouth 3 (three) times daily as needed. Patient not taking: Reported on 03/14/2015 10/08/14   Tammy Triplett, PA-C  diclofenac (VOLTAREN) 75 MG EC tablet Take 75 mg by mouth 2 (two) times daily.    Historical Provider, MD  divalproex (DEPAKOTE ER) 500 MG 24 hr tablet Take 500 mg by mouth daily.    Historical Provider, MD  glimepiride (AMARYL) 4 MG tablet Take 4 mg by mouth 2 (two) times daily.     Historical Provider, MD  guaiFENesin-codeine (ROBITUSSIN AC) 100-10 MG/5ML syrup Take 10 mLs by mouth 3 (three) times daily as needed. Patient not taking: Reported on 03/14/2015 02/12/15   Tammy Triplett, PA-C  metFORMIN (GLUCOPHAGE) 500 MG tablet Take 500 mg by mouth 2 (two) times daily with a meal.    Historical Provider, MD  metoprolol tartrate (LOPRESSOR) 25 MG tablet Take 25 mg by mouth 2 (two) times daily.    Historical Provider, MD  nitroGLYCERIN (NITROSTAT) 0.4 MG SL tablet Place 0.4 mg under the tongue every 5 (five) minutes as needed for chest pain.     Historical Provider, MD  omeprazole (PRILOSEC) 20 MG capsule Take 20 mg by mouth daily.    Historical Provider, MD  phenytoin (DILANTIN) 100 MG ER capsule Take 300 mg by mouth every morning.      Historical Provider, MD  predniSONE (DELTASONE) 10 MG tablet Take 6 tablets day one, 5 tablets day two, 4 tablets day three, 3 tablets  day four, 2 tablets day five, then 1 tablet day six Patient not taking: Reported on 03/14/2015 02/12/15   Tammy Triplett, PA-C    ALLERGIES:  Allergies  Allergen Reactions  . Morphine And Related Shortness Of Breath  . Penicillins Other (See Comments)    Pt states high severity reaction but unknown since childhood  . Tramadol     Seizures   . Ibuprofen Rash    SOCIAL HISTORY:  Social History  Substance Use Topics  . Smoking status: Current Every Day Smoker -- 0.50 packs/day for 20 years    Types: Cigarettes  . Smokeless tobacco: Never Used  . Alcohol Use: Yes      Comment: occasionally    FAMILY HISTORY: Family History  Problem Relation Age of Onset  . Diabetes Mother   . Hypertension Mother   . Heart failure Father   . Stroke Father   . Diabetes Father   . Heart failure Brother     EXAM: BP 141/95 mmHg  Pulse 120  Temp(Src) 100.8 F (38.2 C) (Oral)  Resp 20  Ht 6' (1.829 m)  Wt 265 lb (120.203 kg)  BMI 35.93 kg/m2  SpO2 98% CONSTITUTIONAL: Alert and oriented and responds appropriately to questions. Well-appearing; well-nourished, febrile, in no distress, nontoxic, smiling HEAD: Normocephalic EYES: Conjunctivae clear, PERRL ENT: normal nose; no rhinorrhea; moist mucous membranes; patient has tonsillar hypertrophy bilaterally with exudate and pharyngeal erythema, no trismus or drooling, no uvular deviation, normal phonation, no stridor, swallowing his secretions without difficulty, no sign of Ludwig's angina NECK: Supple, no meningismus, no LAD  CARD: Regular and tachycardic; S1 and S2 appreciated; no murmurs, no clicks, no rubs, no gallops RESP: Normal chest excursion without splinting or tachypnea; breath sounds clear and equal bilaterally; no wheezes, no rhonchi, no rales, no hypoxia or respiratory distress, speaking full sentences ABD/GI: Normal bowel sounds; non-distended; soft, non-tender, no rebound, no guarding, no peritoneal signs BACK:  The back appears normal and is non-tender to palpation, there is no CVA tenderness EXT: Normal ROM in all joints; non-tender to palpation; no edema; normal capillary refill; no cyanosis, no calf tenderness or swelling    SKIN: Normal color for age and race; warm NEURO: Moves all extremities equally, sensation to light touch intact diffusely, cranial nerves II through XII intact PSYCH: The patient's mood and manner are appropriate. Grooming and personal hygiene are appropriate.  MEDICAL DECISION MAKING: Patient here with pharyngitis. Strep test is positive. He is allergic to penicillin. Will  discharge on clindamycin 300 mg 3 times a day for 10 days. Given first dose in the emergency department. Also provided Tylenol which has helped with his fever as well as his heart rate. Please see downtime documentation for vital signs. No sign of deep space neck infection, meningismus, peritonsillar abscess on exam.   As for his back pain, this is chronic and unchanged. He has no new neurologic deficit. Doubt cauda equina, spinal stenosis, epidural abscess or hematoma, discitis, transverse myelitis. I do not feel he needs emergent imaging of his back. I feel he can follow-up with his PCP for management of his chronic back pain. Discussed return precautions. Patient verbalizes understanding and is comfortable with this plan.   I personally performed the services described in this documentation, which was scribed in my presence. The recorded information has been reviewed and is accurate.   [place], DO 06/07/15 0302

## 2015-06-09 DIAGNOSIS — I2510 Atherosclerotic heart disease of native coronary artery without angina pectoris: Secondary | ICD-10-CM | POA: Diagnosis not present

## 2015-06-09 DIAGNOSIS — K210 Gastro-esophageal reflux disease with esophagitis: Secondary | ICD-10-CM | POA: Diagnosis not present

## 2015-06-09 DIAGNOSIS — E1165 Type 2 diabetes mellitus with hyperglycemia: Secondary | ICD-10-CM | POA: Diagnosis not present

## 2015-06-09 DIAGNOSIS — I10 Essential (primary) hypertension: Secondary | ICD-10-CM | POA: Diagnosis not present

## 2015-07-10 ENCOUNTER — Ambulatory Visit (INDEPENDENT_AMBULATORY_CARE_PROVIDER_SITE_OTHER): Payer: Medicare Other | Admitting: Internal Medicine

## 2015-07-10 VITALS — BP 102/78 | HR 69 | Ht 72.0 in | Wt 254.0 lb

## 2015-07-10 DIAGNOSIS — E1169 Type 2 diabetes mellitus with other specified complication: Secondary | ICD-10-CM | POA: Diagnosis not present

## 2015-07-10 DIAGNOSIS — I10 Essential (primary) hypertension: Secondary | ICD-10-CM | POA: Diagnosis not present

## 2015-07-10 DIAGNOSIS — R0602 Shortness of breath: Secondary | ICD-10-CM

## 2015-07-10 MED ORDER — NITROGLYCERIN 0.4 MG SL SUBL: 0.4000 mg | SUBLINGUAL_TABLET | SUBLINGUAL | Status: DC | PRN

## 2015-07-10 MED ORDER — BENAZEPRIL-HYDROCHLOROTHIAZIDE 10-12.5 MG PO TABS: 1.0000 | ORAL_TABLET | Freq: Every day | ORAL | Status: DC

## 2015-07-10 NOTE — Patient Instructions (Signed)
Your physician recommends that you schedule a follow-up appointment in:  To be determined after tests   Your physician has requested that you have an echocardiogram. Echocardiography is a painless test that uses sound waves to create images of your heart. It provides your doctor with information about the size and shape of your heart and how well your heart's chambers and valves are working. This procedure takes approximately one hour. There are no restrictions for this procedure.   Your physician has requested that you have a lexiscan myoview. For further information please visit [email or web address]. Please follow instruction sheet, as given.   DECREASE Lotensin HCTZ to 10/12.5 mg daily    Get blood work today       Thank you for choosing [facility] !

## 2015-07-10 NOTE — Progress Notes (Signed)
Cardiology Office Note   Date:  07/10/2015   ID:  Chad Hampton, DOB 29-Sep-1980, MRN [id number]  PCP:  Robert Bellow, MD  Cardiologist:   Dorris Carnes, MD   Pt presents for eval of CP    History of Present Illness: Chad Hampton is a 35 y.o. male with a history of CP Pt hurts in chest every day  Sometimes lasts all day  Not nec pleuritic  Not positoinal IN 2013 having similar symtpoms  Went to [facility]  then [place] to [facility] showed minimal plaquing .   Pt had syncopal episode  Under engine of truck  Waking up    Fine now  A little short when came in from parking lot Says breathing is worse when lies down at night  Pt still a smoker since teens  Used to smoke 2 to 3 ppd   Down to 1/2 ppd    Has been dizzy for past wk when gets up  Not later in day     Outpatient Prescriptions Prior to Visit  Medication Sig Dispense Refill  . albuterol (PROVENTIL) (2.5 MG/3ML) 0.083% nebulizer solution Take 3 mLs (2.5 mg total) by nebulization every 6 (six) hours as needed for wheezing or shortness of breath. 75 mL 0  . aspirin 81 MG tablet Take 81 mg by mouth daily.    . benazepril-hydrochlorthiazide (LOTENSIN HCT) 20-25 MG per tablet Take 1 tablet by mouth daily.    . diclofenac (VOLTAREN) 75 MG EC tablet Take 75 mg by mouth 2 (two) times daily.    . divalproex (DEPAKOTE ER) 500 MG 24 hr tablet Take 500 mg by mouth daily.    Marland Kitchen glimepiride (AMARYL) 4 MG tablet Take 4 mg by mouth 2 (two) times daily.     . metFORMIN (GLUCOPHAGE) 500 MG tablet Take 500 mg by mouth 2 (two) times daily with a meal.    . metoprolol tartrate (LOPRESSOR) 25 MG tablet Take 25 mg by mouth 2 (two) times daily.    Marland Kitchen omeprazole (PRILOSEC) 20 MG capsule Take 20 mg by mouth daily.    . phenytoin (DILANTIN) 100 MG ER capsule Take 300 mg by mouth every morning.      . cyclobenzaprine (FLEXERIL) 10 MG tablet Take 1 tablet (10 mg total) by mouth 3 (three) times daily as needed. (Patient not taking: Reported on  03/14/2015) 21 tablet 0  . guaiFENesin-codeine (ROBITUSSIN AC) 100-10 MG/5ML syrup Take 10 mLs by mouth 3 (three) times daily as needed. (Patient not taking: Reported on 03/14/2015) 100 mL 0  . nitroGLYCERIN (NITROSTAT) 0.4 MG SL tablet Place 0.4 mg under the tongue every 5 (five) minutes as needed for chest pain.     . predniSONE (DELTASONE) 10 MG tablet Take 6 tablets day one, 5 tablets day two, 4 tablets day three, 3 tablets day four, 2 tablets day five, then 1 tablet day six (Patient not taking: Reported on 03/14/2015) 21 tablet 0   No facility-administered medications prior to visit.     Allergies:   Morphine and related; Penicillins; Tramadol; and Ibuprofen   Past Medical History  Diagnosis Date  . Seizure disorder ([place])   . Asthma   . Hypertension   . Acute angina ([place])   . CAD (coronary artery disease)   . Seizures ([place])   . Diabetes mellitus without complication [facility])     Past Surgical History  Procedure Laterality Date  . Cholecystectomy    . Eye surgery    .  Facial reconstruction surgery       Social History:  The patient  reports that he has been smoking Cigarettes.  He has a 10 pack-year smoking history. He has never used smokeless tobacco. He reports that he drinks alcohol. He reports that he uses illicit drugs (Marijuana).   Family History:  The patient's family history includes Diabetes in his father and mother; Heart failure in his brother and father; Hypertension in his mother; Stroke in his father.    ROS:  Please see the history of present illness. All other systems are reviewed and  Negative to the above problem except as noted.    PHYSICAL EXAM: VS:  BP 102/78 mmHg  Pulse 69  Ht 6' (1.829 m)  Wt 254 lb (115.214 kg)  BMI 34.44 kg/m2  SpO2 96%  GEN: Well nourished, well developed, in no acute distress HEENT: normal Neck: no JVD, carotid bruits, or masses Cardiac: RRR; no murmurs, rubs, or gallops,no edema  Respiratory:  clear to auscultation  bilaterally, normal work of breathing GI: soft, nontender, nondistended, + BS  No hepatomegaly  MS: no deformity Moving all extremities   Skin: warm and dry, no rash Neuro:  Strength and sensation are intact Psych: euthymic mood, full affect   EKG:  EKG is ordered today.  SR 99 on T wave inversion V5, V6    el No results found for: CHOL, TRIG, HDL, CHOLHDL, VLDL, LDLCALC, LDLDIRECT    Wt Readings from Last 3 Encounters:  07/10/15 254 lb (115.214 kg)  06/06/15 265 lb (120.203 kg)  05/10/15 250 lb (113.399 kg)      ASSESSMENT AND PLAN:  1  Chest pain  Very atypical  BP is a little low  Oulw recomm decreaseing Lotensin/HCTZ to 1/2 daily  WIll need to follow   With syncopal spell would set up for echo and lexiscan myoview.    Check labs (CBC, lipids, Hgb A1C)  F/U will be based on test results    2.  Syncope  As above  3.  HTN  Follow as cut back on meds    Signed, Dorris Carnes, MD  07/10/2015 2:38 PM    [address], [place], [place]  [address] Phone: [phone]; Fax: [phone]

## 2015-07-11 LAB — CBC
HCT: 44.2 % (ref 39.0–52.0)
HEMOGLOBIN: 15.2 g/dL (ref 13.0–17.0)
MCH: 30.8 pg (ref 26.0–34.0)
MCHC: 34.4 g/dL (ref 30.0–36.0)
MCV: 89.5 fL (ref 78.0–100.0)
MPV: 10.4 fL (ref 8.6–12.4)
Platelets: 306 10*3/uL (ref 150–400)
RBC: 4.94 MIL/uL (ref 4.22–5.81)
RDW: 13.4 % (ref 11.5–15.5)
WBC: 11.8 10*3/uL — ABNORMAL HIGH (ref 4.0–10.5)

## 2015-07-11 LAB — BASIC METABOLIC PANEL
BUN: 13 mg/dL (ref 7–25)
CO2: 28 mmol/L (ref 20–31)
Calcium: 9.3 mg/dL (ref 8.6–10.3)
Chloride: 103 mmol/L (ref 98–110)
Creat: 0.87 mg/dL (ref 0.60–1.35)
GLUCOSE: 57 mg/dL — AB (ref 65–99)
POTASSIUM: 4.3 mmol/L (ref 3.5–5.3)
SODIUM: 136 mmol/L (ref 135–146)

## 2015-07-11 LAB — HEMOGLOBIN A1C
Hgb A1c MFr Bld: 5.7 % — ABNORMAL HIGH (ref <5.7)
MEAN PLASMA GLUCOSE: 117 mg/dL — AB (ref <117)

## 2015-07-11 LAB — LIPID PANEL
CHOL/HDL RATIO: 8.3 ratio — AB (ref <=5.0)
CHOLESTEROL: 141 mg/dL (ref 125–200)
HDL: 17 mg/dL — ABNORMAL LOW (ref >=40)
LDL CALC: 62 mg/dL (ref <130)
Triglycerides: 310 mg/dL — ABNORMAL HIGH (ref <150)
VLDL: 62 mg/dL — AB (ref <30)

## 2015-07-12 ENCOUNTER — Telehealth: Payer: Self-pay

## 2015-07-12 NOTE — Telephone Encounter (Signed)
-----   Message from Dorris Carnes V, MD sent at 07/11/2015  5:50 PM EDT ----- Hgb A1C is good at 5.7   Lipids:  LDL is very good at 62  HDL (the good cholesterol) is very low at 17  Part is genetic  Part related to glucose  Need to try to increase physcial activity which can raiise this.   Also watch glucose/carbohydrates Triglycerides are elevated at 310.  Again, watch carbohydrates  Electrolytes OK but glucose a little low CBC is OK

## 2015-07-12 NOTE — Telephone Encounter (Signed)
[id number]- LM

## 2015-07-21 ENCOUNTER — Ambulatory Visit (HOSPITAL_COMMUNITY): Payer: Medicare Other | Attending: Internal Medicine

## 2015-07-21 ENCOUNTER — Encounter (HOSPITAL_COMMUNITY): Payer: Medicare Other

## 2015-07-21 ENCOUNTER — Encounter (HOSPITAL_COMMUNITY): Admission: RE | Admit: 2015-07-21 | Payer: Medicare Other | Source: Ambulatory Visit

## 2015-07-21 ENCOUNTER — Inpatient Hospital Stay (HOSPITAL_COMMUNITY): Admission: RE | Admit: 2015-07-21 | Payer: Self-pay | Source: Ambulatory Visit

## 2015-09-17 ENCOUNTER — Emergency Department (HOSPITAL_COMMUNITY)
Admission: EM | Admit: 2015-09-17 | Discharge: 2015-09-17 | Disposition: A | Discharge status: Home / Self Care | Payer: Medicare Other | Attending: Emergency Medicine | Admitting: Emergency Medicine

## 2015-09-17 ENCOUNTER — Encounter (HOSPITAL_COMMUNITY): Payer: Self-pay | Admitting: Emergency Medicine

## 2015-09-17 DIAGNOSIS — I2510 Atherosclerotic heart disease of native coronary artery without angina pectoris: Secondary | ICD-10-CM | POA: Insufficient documentation

## 2015-09-17 DIAGNOSIS — R569 Unspecified convulsions: Secondary | ICD-10-CM | POA: Diagnosis present

## 2015-09-17 DIAGNOSIS — F17210 Nicotine dependence, cigarettes, uncomplicated: Secondary | ICD-10-CM | POA: Diagnosis not present

## 2015-09-17 DIAGNOSIS — G40909 Epilepsy, unspecified, not intractable, without status epilepticus: Secondary | ICD-10-CM | POA: Insufficient documentation

## 2015-09-17 DIAGNOSIS — I10 Essential (primary) hypertension: Secondary | ICD-10-CM | POA: Diagnosis not present

## 2015-09-17 DIAGNOSIS — J45909 Unspecified asthma, uncomplicated: Secondary | ICD-10-CM | POA: Insufficient documentation

## 2015-09-17 DIAGNOSIS — E119 Type 2 diabetes mellitus without complications: Secondary | ICD-10-CM | POA: Insufficient documentation

## 2015-09-17 LAB — CBG MONITORING, ED: GLUCOSE-CAPILLARY: 101 mg/dL — AB (ref 65–99)

## 2015-09-17 MED ORDER — PHENYTOIN SODIUM EXTENDED 100 MG PO CAPS
300.0000 mg | ORAL_CAPSULE | Freq: Once | ORAL | Status: AC
  Administered 2015-09-17: 300 mg via ORAL
  Filled 2015-09-17: qty 3

## 2015-09-17 MED ORDER — PHENYTOIN SODIUM EXTENDED 100 MG PO CAPS: 300.0000 mg | ORAL_CAPSULE | Freq: Every day | ORAL | Status: DC

## 2015-09-17 NOTE — ED Provider Notes (Signed)
CSN: [id number]     Arrival date & time 09/17/15  2104 History  By signing my name below, I, Chad Hampton, attest that this documentation has been prepared under the direction and in the presence of Nat Christen, MD.   Electronically Signed: Nicole Hampton, ED Scribe. 09/17/2015. 10:17 PM   Chief Complaint  Patient presents with  . Seizures   The history is provided by the patient. No language interpreter was used.   HPI Comments: Chad Hampton is a 35 y.o. male with PMHx of seizure disorder, DM, CAD, and acute angina who presents to the Emergency Department via EMS complaining of sudden onset, convulsive seizure x1 episode, beginning earlier tonight. Pt's friends report the episode lasted "a split second". Pt is on dilantin for his seizure disorder but is noncompliant with his medication. No other associated symptoms noted. No other worsening or alleviating factors noted. Pt denies iv drug use, confusion, or any other pertinent symptoms.  Pt is occasional drinker but did not drink today. He did use marijuana today.   Past Medical History  Diagnosis Date  . Seizure disorder (Chad Hampton)   . Asthma   . Hypertension   . Acute angina (Chad Hampton)   . CAD (coronary artery disease)   . Seizures (Chad Hampton)   . Diabetes mellitus without complication [facility])    Past Surgical History  Procedure Laterality Date  . Cholecystectomy    . Eye surgery    . Facial reconstruction surgery     Family History  Problem Relation Age of Onset  . Diabetes Mother   . Hypertension Mother   . Heart failure Father   . Stroke Father   . Diabetes Father   . Heart failure Brother    Social History  Substance Use Topics  . Smoking status: Current Every Day Smoker -- 1.00 packs/day for 20 years    Types: Cigarettes  . Smokeless tobacco: Never Used  . Alcohol Use: Yes     Comment: occasionally    Review of Systems  A complete 10 system review of systems was obtained and all systems are negative except as noted in the  HPI and PMH.   Allergies  Morphine and related; Penicillins; Tramadol; and Ibuprofen  Home Medications   Prior to Admission medications   Medication Sig Start Date End Date Taking? Authorizing Provider  albuterol (PROVENTIL) (2.5 MG/3ML) 0.083% nebulizer solution Take 3 mLs (2.5 mg total) by nebulization every 6 (six) hours as needed for wheezing or shortness of breath. 02/12/15   Tammy Triplett, PA-C  aspirin 81 MG tablet Take 81 mg by mouth daily.    Historical Provider, MD  benazepril-hydrochlorthiazide (LOTENSIN HCT) 10-12.5 MG tablet Take 1 tablet by mouth daily. 07/10/15   [name], MD  diclofenac (VOLTAREN) 75 MG EC tablet Take 75 mg by mouth 2 (two) times daily.    Historical Provider, MD  glimepiride (AMARYL) 4 MG tablet Take 4 mg by mouth 2 (two) times daily.     Historical Provider, MD  metoprolol tartrate (LOPRESSOR) 25 MG tablet Take 25 mg by mouth 2 (two) times daily.    Historical Provider, MD  nitroGLYCERIN (NITROSTAT) 0.4 MG SL tablet Place 1 tablet (0.4 mg total) under the tongue every 5 (five) minutes as needed for chest pain. 07/10/15   [name], MD  omeprazole (PRILOSEC) 20 MG capsule Take 20 mg by mouth daily.    Historical Provider, MD  phenytoin (DILANTIN) 100 MG ER capsule Take 3 capsules (300 mg  total) by mouth at bedtime. 09/17/15   Nat Christen, MD   BP 134/84 mmHg  Pulse 80  Temp(Src) 98.4 F (36.9 C) (Oral)  Resp 18  Ht 6' (1.829 m)  Wt 245 lb (111.131 kg)  BMI 33.22 kg/m2  SpO2 99% Physical Exam  Constitutional: He is oriented to person, place, and time. He appears well-developed and well-nourished.  HENT:  Head: Normocephalic and atraumatic.  Eyes: Conjunctivae and EOM are normal. Pupils are equal, round, and reactive to light.  Neck: Normal range of motion. Neck supple.  Cardiovascular: Normal rate and regular rhythm.   Pulmonary/Chest: Effort normal and breath sounds normal.  Abdominal: Soft. Bowel sounds are normal.  Musculoskeletal: Normal  range of motion.  Neurological: He is alert and oriented to person, place, and time.  Skin: Skin is warm and dry.  Psychiatric: He has a normal mood and affect. His behavior is normal.  Nursing note and vitals reviewed.   ED Course  Procedures (including critical care time) DIAGNOSTIC STUDIES: Oxygen Saturation is 99% on RA, normal by my interpretation.    COORDINATION OF CARE: 9:43 PM Discussed treatment plan which includes dilantin with pt at bedside and pt agreed to plan.  Labs Review  Labs Reviewed - No data to display  Imaging Review No results found.   EKG Interpretation   Date/Time:  Sunday Sep 17 2015 21:11:24 EDT Ventricular Rate:  78 PR Interval:  149 QRS Duration: 95 QT Interval:  372 QTC Calculation: 424 R Axis:   72 Text Interpretation:  Sinus rhythm Confirmed by Tay Whitwell  MD, Vishwa Dais ([id number]) on  09/17/2015 10:04:12 PM      MDM   Final diagnoses:  Seizure (Chad Hampton)   Patient is alert and oriented 3 without neurological deficits. Glucose normal. He is known to be noncompliant with his seizure medication. Will restart Dilantin 300 mg daily. Patient encouraged to get neurology or primary care follow-up.    I personally performed the services described in this documentation, which was scribed in my presence. The recorded information has been reviewed and is accurate.      Nat Christen, MD 09/17/15 2219

## 2015-09-17 NOTE — ED Notes (Signed)
Spoke with Dr. Marsa Aris, hold off on standing orders at this time until pt is evaluated by a physician

## 2015-09-17 NOTE — ED Notes (Signed)
Per EMS friends state that pt state down in chair and had a convulsive seizure for "a split second", able to arouse pt with ammonia and not postictal when EMS arrived or upon arrival to hospital.

## 2015-09-17 NOTE — Discharge Instructions (Signed)
Seizure, Adult A seizure means there is unusual activity in the brain. A seizure can cause changes in attention or behavior. Seizures often cause shaking (convulsions). Seizures often last from 30 seconds to 2 minutes. HOME CARE   If you are given medicines, take them exactly as told by your doctor.  Keep all doctor visits as told.  Do not swim or drive until your doctor says it is okay.  Teach others what to do if you have a seizure. They should:  Lay you on the ground.  Put a cushion under your head.  Loosen any tight clothing around your neck.  Turn you on your side.  Stay with you until you get better. GET HELP RIGHT AWAY IF:   The seizure lasts longer than 2 to 5 minutes.  The seizure is very bad.  The person does not wake up after the seizure.  The person's attention or behavior changes. Drive the person to the emergency room or call your local emergency services (911 in [place] MAKE SURE YOU:   Understand these instructions.  Will watch your condition.  Will get help right away if you are not doing well or get worse.   This information is not intended to replace advice given to you by your health care provider. Make sure you discuss any questions you have with your health care provider.   Document Released: 10/02/2007 Document Revised: 07/08/2011 Document Reviewed: 11/25/2012 Elsevier Interactive Patient Education 2016 [employer].   Prescription for Dilantin given. You must get a primary care doctor or follow-up with the neurologist. Phone number given.

## 2016-04-01 ENCOUNTER — Emergency Department (HOSPITAL_COMMUNITY)
Admission: EM | Admit: 2016-04-01 | Discharge: 2016-04-01 | Disposition: A | Discharge status: Home / Self Care | Payer: Medicare Other | Attending: Emergency Medicine | Admitting: Emergency Medicine

## 2016-04-01 ENCOUNTER — Encounter (HOSPITAL_COMMUNITY): Payer: Self-pay | Admitting: Emergency Medicine

## 2016-04-01 DIAGNOSIS — Z7984 Long term (current) use of oral hypoglycemic drugs: Secondary | ICD-10-CM | POA: Diagnosis not present

## 2016-04-01 DIAGNOSIS — J45909 Unspecified asthma, uncomplicated: Secondary | ICD-10-CM | POA: Diagnosis not present

## 2016-04-01 DIAGNOSIS — F17210 Nicotine dependence, cigarettes, uncomplicated: Secondary | ICD-10-CM | POA: Insufficient documentation

## 2016-04-01 DIAGNOSIS — Z7982 Long term (current) use of aspirin: Secondary | ICD-10-CM | POA: Insufficient documentation

## 2016-04-01 DIAGNOSIS — I2510 Atherosclerotic heart disease of native coronary artery without angina pectoris: Secondary | ICD-10-CM | POA: Insufficient documentation

## 2016-04-01 DIAGNOSIS — E119 Type 2 diabetes mellitus without complications: Secondary | ICD-10-CM | POA: Diagnosis not present

## 2016-04-01 DIAGNOSIS — I10 Essential (primary) hypertension: Secondary | ICD-10-CM | POA: Insufficient documentation

## 2016-04-01 DIAGNOSIS — L02411 Cutaneous abscess of right axilla: Secondary | ICD-10-CM | POA: Diagnosis not present

## 2016-04-01 MED ORDER — DOXYCYCLINE HYCLATE 100 MG PO CAPS: 100.0000 mg | ORAL_CAPSULE | Freq: Two times a day (BID) | ORAL | 0 refills | Status: DC

## 2016-04-01 NOTE — ED Provider Notes (Signed)
Chad Hampton DEPT Provider Note   CSN: [id number] Arrival date & time: 04/01/16  1554  By signing my name below, I, Chad Hampton, attest that this documentation has been prepared under the direction and in the presence of Lily Kocher, PA-C. Electronically Signed: Rayna Hampton, ED Scribe. 04/01/16. 6:32 PM.   History   Chief Complaint Chief Complaint  Patient presents with  . Abscess    HPI HPI Comments: Chad Hampton is a 35 y.o. male who presents to the Emergency Department complaining of worsening point of pain and swelling to his right axilla x 2 days. He reports associated redness in the affected region. Pt denies any injuries or wounds to the affected region. He denies a h/o MRSA or abscesses. He denies fevers, chills or other associated symptoms at this time.    The history is provided by the patient. No language interpreter was used.  Abscess  Location:  Shoulder/arm Shoulder/arm abscess location:  R axilla Abscess quality: painful and redness   Abscess quality: not draining   Red streaking: no   Duration:  2 days Progression:  Worsening Pain details:    Severity:  Mild   Duration:  1 day   Timing:  Constant   Progression:  Worsening Chronicity:  New Relieved by:  None tried Ineffective treatments:  None tried Associated symptoms: no fever   Risk factors: no hx of MRSA and no prior abscess     Past Medical History:  Diagnosis Date  . Acute angina ([place])   . Asthma   . CAD (coronary artery disease)   . Diabetes mellitus without complication ([place])   . Hypertension   . Seizure disorder ([place])   . Seizures [facility])     Patient Active Problem List   Diagnosis Date Noted  . MDD (major depressive disorder), recurrent severe, without psychosis ([place]) 05/11/2015  . Cannabis abuse 05/11/2015  . Substance induced mood disorder ([place]) 05/11/2015    Past Surgical History:  Procedure Laterality Date  . CHOLECYSTECTOMY    . EYE SURGERY    . FACIAL  RECONSTRUCTION SURGERY         Home Medications    Prior to Admission medications   Medication Sig Start Date End Date Taking? Authorizing Provider  albuterol (PROVENTIL) (2.5 MG/3ML) 0.083% nebulizer solution Take 3 mLs (2.5 mg total) by nebulization every 6 (six) hours as needed for wheezing or shortness of breath. 02/12/15   Tammy Triplett, PA-C  aspirin 81 MG tablet Take 81 mg by mouth daily.    Historical Provider, MD  benazepril-hydrochlorthiazide (LOTENSIN HCT) 10-12.5 MG tablet Take 1 tablet by mouth daily. 07/10/15   [name], MD  diclofenac (VOLTAREN) 75 MG EC tablet Take 75 mg by mouth 2 (two) times daily.    Historical Provider, MD  glimepiride (AMARYL) 4 MG tablet Take 4 mg by mouth 2 (two) times daily.     Historical Provider, MD  metoprolol tartrate (LOPRESSOR) 25 MG tablet Take 25 mg by mouth 2 (two) times daily.    Historical Provider, MD  nitroGLYCERIN (NITROSTAT) 0.4 MG SL tablet Place 1 tablet (0.4 mg total) under the tongue every 5 (five) minutes as needed for chest pain. 07/10/15   [name], MD  omeprazole (PRILOSEC) 20 MG capsule Take 20 mg by mouth daily.    Historical Provider, MD  phenytoin (DILANTIN) 100 MG ER capsule Take 3 capsules (300 mg total) by mouth at bedtime. 09/17/15   Nat Christen, MD    Family History  Family History  Problem Relation Age of Onset  . Diabetes Mother   . Hypertension Mother   . Heart failure Father   . Stroke Father   . Diabetes Father   . Heart failure Brother     Social History Social History  Substance Use Topics  . Smoking status: Current Every Day Smoker    Packs/day: 1.00    Years: 20.00    Types: Cigarettes  . Smokeless tobacco: Never Used  . Alcohol use Yes     Comment: occasionally     Allergies   Morphine and related; Penicillins; Tramadol; and Ibuprofen   Review of Systems Review of Systems  Constitutional: Negative for chills and fever.  Musculoskeletal: Positive for myalgias.  Skin: Positive  for color change and rash (abscess).  Allergic/Immunologic: Positive for immunocompromised state (DM).  All other systems reviewed and are negative.  Physical Exam Updated Vital Signs BP 141/87 (BP Location: Left Arm)   Pulse 80   Temp 97.7 F (36.5 C) (Oral)   Resp 20   Ht 6' (1.829 m)   Wt 265 lb (120.2 kg)   SpO2 97%   BMI 35.94 kg/m   Physical Exam  Constitutional: He is oriented to person, place, and time. He appears well-developed and well-nourished. No distress.  HENT:  Head: Normocephalic and atraumatic.  Eyes: EOM are normal.  Neck: Normal range of motion.  Cardiovascular: Normal rate and intact distal pulses.   Pulses:      Radial pulses are 2+ on the right side.  Pulmonary/Chest: Effort normal. No respiratory distress.  Abdominal: Soft.  Musculoskeletal: Normal range of motion. He exhibits tenderness.  RUE: radial pulse 2+. Capillary refill <2 seconds. No temperature change of the upper extremity.   Neurological: He is alert and oriented to person, place, and time.  Skin: Skin is warm and dry. Capillary refill takes less than 2 seconds. He is not diaphoretic. There is erythema.  Tender, red, raised area of the right upper axilla. No red streaks. No drainage. No palpable lymph nodes. Area is not hot.   Psychiatric: He has a normal mood and affect.  Nursing note and vitals reviewed.  ED Treatments / Results  Labs (all labs ordered are listed, but only abnormal results are displayed) Labs Reviewed - No data to display  EKG  EKG Interpretation None       Radiology No results found.  Procedures Procedures  DIAGNOSTIC STUDIES: Oxygen Saturation is 97% on RA, normal by my interpretation.    COORDINATION OF CARE: 6:32 PM Discussed next steps with pt. Pt verbalized understanding and is agreeable with the plan.    Medications Ordered in ED Medications - No data to display  Initial Impression / Assessment and Plan / ED Course  I have reviewed the triage  vital signs and the nursing notes.  Pertinent labs & imaging results that were available during my care of the patient were reviewed by me and considered in my medical decision making (see chart for details).  Clinical Course     MDM - Patient with skin abscess. Incision and drainage not warranted at this time. Supportive care and return precautions discussed.  Pt sent home with **Doxycycline*. Patient will use warm Epsom salt tub soaks 2 times daily. The patient appears reasonably screened and/or stabilized for discharge and I doubt any other emergent medical condition requiring further screening, evaluation, or treatment in the ED prior to discharge.  **I personally performed the services described in this documentation, which  was scribed in my presence. The recorded information has been reviewed and is accurate.*  Final Clinical Impressions(s) / ED Diagnoses   Final diagnoses:  Abscess of axilla, right    New Prescriptions New Prescriptions   DOXYCYCLINE (VIBRAMYCIN) 100 MG CAPSULE    Take 1 capsule (100 mg total) by mouth 2 (two) times daily.     Lily Kocher, PA-C 04/01/16 1844    Fredia Sorrow, MD 04/03/16 [phone]

## 2016-04-01 NOTE — ED Triage Notes (Signed)
PT c/o red painful raised area to right axilla x3-4 days with no drainage.

## 2016-04-01 NOTE — Discharge Instructions (Signed)
Please use warm Epsom salt tub soaks to the right under arm area 2 times daily until the abscess has resolved. Use doxycycline 2 times daily until all taken. Please see Dr. Karie Kirks in the office for evaluation if these areas are getting worse.

## 2016-06-11 ENCOUNTER — Emergency Department (HOSPITAL_COMMUNITY)
Admission: EM | Admit: 2016-06-11 | Discharge: 2016-06-11 | Disposition: A | Discharge status: Home / Self Care | Payer: Medicare Other | Attending: Emergency Medicine | Admitting: Emergency Medicine

## 2016-06-11 ENCOUNTER — Encounter (HOSPITAL_COMMUNITY): Payer: Self-pay | Admitting: *Deleted

## 2016-06-11 DIAGNOSIS — Z7982 Long term (current) use of aspirin: Secondary | ICD-10-CM | POA: Diagnosis not present

## 2016-06-11 DIAGNOSIS — F17210 Nicotine dependence, cigarettes, uncomplicated: Secondary | ICD-10-CM | POA: Insufficient documentation

## 2016-06-11 DIAGNOSIS — Z79899 Other long term (current) drug therapy: Secondary | ICD-10-CM | POA: Diagnosis not present

## 2016-06-11 DIAGNOSIS — F1290 Cannabis use, unspecified, uncomplicated: Secondary | ICD-10-CM | POA: Insufficient documentation

## 2016-06-11 DIAGNOSIS — K0889 Other specified disorders of teeth and supporting structures: Secondary | ICD-10-CM

## 2016-06-11 DIAGNOSIS — J45909 Unspecified asthma, uncomplicated: Secondary | ICD-10-CM | POA: Diagnosis not present

## 2016-06-11 DIAGNOSIS — Z7984 Long term (current) use of oral hypoglycemic drugs: Secondary | ICD-10-CM | POA: Insufficient documentation

## 2016-06-11 DIAGNOSIS — E119 Type 2 diabetes mellitus without complications: Secondary | ICD-10-CM | POA: Insufficient documentation

## 2016-06-11 DIAGNOSIS — K029 Dental caries, unspecified: Secondary | ICD-10-CM | POA: Insufficient documentation

## 2016-06-11 DIAGNOSIS — I2510 Atherosclerotic heart disease of native coronary artery without angina pectoris: Secondary | ICD-10-CM | POA: Diagnosis not present

## 2016-06-11 DIAGNOSIS — I10 Essential (primary) hypertension: Secondary | ICD-10-CM | POA: Diagnosis not present

## 2016-06-11 MED ORDER — NAPROXEN 250 MG PO TABS
500.0000 mg | ORAL_TABLET | Freq: Once | ORAL | Status: AC
  Administered 2016-06-11: 500 mg via ORAL
  Filled 2016-06-11: qty 2

## 2016-06-11 MED ORDER — CLINDAMYCIN HCL 150 MG PO CAPS: 300.0000 mg | ORAL_CAPSULE | Freq: Four times a day (QID) | ORAL | 0 refills | Status: DC

## 2016-06-11 MED ORDER — NAPROXEN 500 MG PO TABS: 500.0000 mg | ORAL_TABLET | Freq: Two times a day (BID) | ORAL | 0 refills | Status: DC

## 2016-06-11 NOTE — ED Provider Notes (Signed)
[facility] DEPT Provider Note   CSN: [id number] Arrival date & time: 06/11/16  0152     History   Chief Complaint Chief Complaint  Patient presents with  . Dental Pain    HPI Chad Hampton is a 36 y.o. male.  HPI  This is a 36 year old male who presents with dental pain. Patient reports a 2 to three-day history of worsening left lower dental pain. He has a dentist last saw him over one month ago. Denies difficulty swallowing. Rates his pain 8 out of 10. No fevers. He took Tylenol at home with minimal relief. No recent dental procedures.  Past Medical History:  Diagnosis Date  . Acute angina ([place])   . Asthma   . CAD (coronary artery disease)   . Diabetes mellitus without complication ([place])   . Hypertension   . Seizure disorder ([place])   . Seizures [facility])     Patient Active Problem List   Diagnosis Date Noted  . MDD (major depressive disorder), recurrent severe, without psychosis ([place]) 05/11/2015  . Cannabis abuse 05/11/2015  . Substance induced mood disorder ([place]) 05/11/2015    Past Surgical History:  Procedure Laterality Date  . CHOLECYSTECTOMY    . EYE SURGERY    . FACIAL RECONSTRUCTION SURGERY         Home Medications    Prior to Admission medications   Medication Sig Start Date End Date Taking? Authorizing Provider  albuterol (PROVENTIL) (2.5 MG/3ML) 0.083% nebulizer solution Take 3 mLs (2.5 mg total) by nebulization every 6 (six) hours as needed for wheezing or shortness of breath. 02/12/15   Tammy Triplett, PA-C  aspirin 81 MG tablet Take 81 mg by mouth daily.    Historical Provider, MD  benazepril-hydrochlorthiazide (LOTENSIN HCT) 10-12.5 MG tablet Take 1 tablet by mouth daily. 07/10/15   [name], MD  clindamycin (CLEOCIN) 150 MG capsule Take 2 capsules (300 mg total) by mouth every 6 (six) hours. 06/11/16   Merryl Hacker, MD  diclofenac (VOLTAREN) 75 MG EC tablet Take 75 mg by mouth 2 (two) times daily.    Historical Provider, MD  doxycycline  (VIBRAMYCIN) 100 MG capsule Take 1 capsule (100 mg total) by mouth 2 (two) times daily. 04/01/16   Lily Kocher, PA-C  glimepiride (AMARYL) 4 MG tablet Take 4 mg by mouth 2 (two) times daily.     Historical Provider, MD  metoprolol tartrate (LOPRESSOR) 25 MG tablet Take 25 mg by mouth 2 (two) times daily.    Historical Provider, MD  naproxen (NAPROSYN) 500 MG tablet Take 1 tablet (500 mg total) by mouth 2 (two) times daily. 06/11/16   Merryl Hacker, MD  nitroGLYCERIN (NITROSTAT) 0.4 MG SL tablet Place 1 tablet (0.4 mg total) under the tongue every 5 (five) minutes as needed for chest pain. 07/10/15   [name], MD  omeprazole (PRILOSEC) 20 MG capsule Take 20 mg by mouth daily.    Historical Provider, MD  phenytoin (DILANTIN) 100 MG ER capsule Take 3 capsules (300 mg total) by mouth at bedtime. 09/17/15   Nat Christen, MD    Family History Family History  Problem Relation Age of Onset  . Diabetes Mother   . Hypertension Mother   . Heart failure Father   . Stroke Father   . Diabetes Father   . Heart failure Brother     Social History Social History  Substance Use Topics  . Smoking status: Current Every Day Smoker    Packs/day: 1.00  Years: 20.00    Types: Cigarettes  . Smokeless tobacco: Never Used  . Alcohol use Yes     Comment: occasionally     Allergies   Morphine and related; Penicillins; Tramadol; and Ibuprofen   Review of Systems Review of Systems  Constitutional: Negative for fever.  HENT: Positive for dental problem. Negative for trouble swallowing.   All other systems reviewed and are negative.    Physical Exam Updated Vital Signs BP 144/92 (BP Location: Right Arm)   Pulse 79   Temp 98.5 F (36.9 C) (Oral)   Resp 16   Ht 6' (1.829 m)   Wt 245 lb (111.1 kg)   SpO2 98%   BMI 33.23 kg/m   Physical Exam  Constitutional: He is oriented to person, place, and time. He appears well-developed and well-nourished. No distress.  HENT:  Head: Normocephalic  and atraumatic.  Eroding dental carry noted over tooth 19, tenderness to palpation along the gumline without obvious abscess, no trismus, no fullness noted under the tongue, no overlying facial skin changes  Cardiovascular: Normal rate and regular rhythm.   Pulmonary/Chest: Effort normal. No respiratory distress.  Musculoskeletal: He exhibits no edema.  Lymphadenopathy:    He has no cervical adenopathy.  Neurological: He is alert and oriented to person, place, and time.  Skin: Skin is warm and dry.  Psychiatric: He has a normal mood and affect.  Nursing note and vitals reviewed.    ED Treatments / Results  Labs (all labs ordered are listed, but only abnormal results are displayed) Labs Reviewed - No data to display  EKG  EKG Interpretation None       Radiology No results found.  Procedures Procedures (including critical care time)  Medications Ordered in ED Medications  naproxen (NAPROSYN) tablet 500 mg (not administered)     Initial Impression / Assessment and Plan / ED Course  I have reviewed the triage vital signs and the nursing notes.  Pertinent labs & imaging results that were available during my care of the patient were reviewed by me and considered in my medical decision making (see chart for details).     Presents with dental pain. He appears to have an eroded tooth secondary to dental caries. No obvious abscess but will treat for infection. Naproxen as needed for pain. Follow-up with dentist as soon as possible.  After history, exam, and medical workup I feel the patient has been appropriately medically screened and is safe for discharge home. Pertinent diagnoses were discussed with the patient. Patient was given return precautions.   Final Clinical Impressions(s) / ED Diagnoses   Final diagnoses:  Pain, dental  Dental caries    New Prescriptions New Prescriptions   CLINDAMYCIN (CLEOCIN) 150 MG CAPSULE    Take 2 capsules (300 mg total) by mouth  every 6 (six) hours.   NAPROXEN (NAPROSYN) 500 MG TABLET    Take 1 tablet (500 mg total) by mouth 2 (two) times daily.     Merryl Hacker, MD 06/11/16 [phone]

## 2016-06-11 NOTE — ED Notes (Signed)
Pt ambulatory to waiting room. Pt verbalized understanding of discharge instructions.   

## 2016-06-11 NOTE — Discharge Instructions (Signed)
You were seen today for dental pain. It appears that you have an eroding dental cavity and need to see your dentist. If you develop difficulty swallowing, fevers, swelling need to be reevaluated immediately. He'll be given antibiotics.

## 2016-06-11 NOTE — ED Triage Notes (Signed)
Pt c/o dental pain to left lower tooth for the last couple of days

## 2016-07-24 ENCOUNTER — Encounter (HOSPITAL_COMMUNITY): Payer: Self-pay | Admitting: *Deleted

## 2016-07-24 ENCOUNTER — Emergency Department (HOSPITAL_COMMUNITY)
Admission: EM | Admit: 2016-07-24 | Discharge: 2016-07-24 | Disposition: A | Discharge status: Home / Self Care | Payer: Medicare Other | Attending: Emergency Medicine | Admitting: Emergency Medicine

## 2016-07-24 ENCOUNTER — Emergency Department (HOSPITAL_COMMUNITY): Payer: Medicare Other

## 2016-07-24 DIAGNOSIS — F17210 Nicotine dependence, cigarettes, uncomplicated: Secondary | ICD-10-CM | POA: Diagnosis not present

## 2016-07-24 DIAGNOSIS — I2510 Atherosclerotic heart disease of native coronary artery without angina pectoris: Secondary | ICD-10-CM | POA: Insufficient documentation

## 2016-07-24 DIAGNOSIS — M542 Cervicalgia: Secondary | ICD-10-CM | POA: Diagnosis not present

## 2016-07-24 DIAGNOSIS — Z7982 Long term (current) use of aspirin: Secondary | ICD-10-CM | POA: Insufficient documentation

## 2016-07-24 DIAGNOSIS — Z7984 Long term (current) use of oral hypoglycemic drugs: Secondary | ICD-10-CM | POA: Insufficient documentation

## 2016-07-24 DIAGNOSIS — R221 Localized swelling, mass and lump, neck: Secondary | ICD-10-CM | POA: Diagnosis not present

## 2016-07-24 DIAGNOSIS — E119 Type 2 diabetes mellitus without complications: Secondary | ICD-10-CM | POA: Diagnosis not present

## 2016-07-24 DIAGNOSIS — I10 Essential (primary) hypertension: Secondary | ICD-10-CM | POA: Diagnosis not present

## 2016-07-24 DIAGNOSIS — J45909 Unspecified asthma, uncomplicated: Secondary | ICD-10-CM | POA: Diagnosis not present

## 2016-07-24 LAB — I-STAT CREATININE, ED: Creatinine, Ser: 1 mg/dL (ref 0.61–1.24)

## 2016-07-24 LAB — BASIC METABOLIC PANEL
Anion gap: 6 (ref 5–15)
BUN: 10 mg/dL (ref 6–20)
CHLORIDE: 104 mmol/L (ref 101–111)
CO2: 24 mmol/L (ref 22–32)
CREATININE: 0.99 mg/dL (ref 0.61–1.24)
Calcium: 8.7 mg/dL — ABNORMAL LOW (ref 8.9–10.3)
Glucose, Bld: 141 mg/dL — ABNORMAL HIGH (ref 65–99)
Potassium: 3.5 mmol/L (ref 3.5–5.1)
Sodium: 134 mmol/L — ABNORMAL LOW (ref 135–145)

## 2016-07-24 LAB — CBC WITH DIFFERENTIAL/PLATELET
BASOS ABS: 0.1 10*3/uL (ref 0.0–0.1)
BASOS PCT: 0 %
Eosinophils Absolute: 0.4 10*3/uL (ref 0.0–0.7)
Eosinophils Relative: 3 %
HEMATOCRIT: 43.8 % (ref 39.0–52.0)
Hemoglobin: 15.5 g/dL (ref 13.0–17.0)
Lymphocytes Relative: 43 %
Lymphs Abs: 5.4 10*3/uL — ABNORMAL HIGH (ref 0.7–4.0)
MCH: 31.9 pg (ref 26.0–34.0)
MCHC: 35.4 g/dL (ref 30.0–36.0)
MCV: 90.1 fL (ref 78.0–100.0)
MONO ABS: 0.6 10*3/uL (ref 0.1–1.0)
Monocytes Relative: 5 %
NEUTROS ABS: 6 10*3/uL (ref 1.7–7.7)
NEUTROS PCT: 49 %
Platelets: 250 10*3/uL (ref 150–400)
RBC: 4.86 MIL/uL (ref 4.22–5.81)
RDW: 12.8 % (ref 11.5–15.5)
WBC: 12.3 10*3/uL — ABNORMAL HIGH (ref 4.0–10.5)

## 2016-07-24 LAB — SEDIMENTATION RATE: SED RATE: 12 mm/h (ref 0–16)

## 2016-07-24 MED ORDER — IOPAMIDOL (ISOVUE-300) INJECTION 61%
75.0000 mL | Freq: Once | INTRAVENOUS | Status: AC | PRN
  Administered 2016-07-24: 75 mL via INTRAVENOUS

## 2016-07-24 NOTE — ED Triage Notes (Signed)
Pt come in for a "knot" on the right side of his neck. Pt states this popped up 3 days ago. Pt denies any internal throat pain but does say that he feels this sometimes impedes his breathing.. Pt has enlarged tonsils with no drainage noted. Pt has no shortness of breath noted in triage.

## 2016-07-24 NOTE — ED Notes (Signed)
Patient was given soft drink for consumption.

## 2016-07-24 NOTE — Discharge Instructions (Signed)
Follow-up with a CAT scan. Return immediately for any numbness or weakness.

## 2016-07-24 NOTE — ED Provider Notes (Signed)
[facility] DEPT Provider Note   CSN: [id number] Arrival date & time: 07/24/16  1716     History   Chief Complaint Chief Complaint  Patient presents with  . Neck Pain    HPI RAIF CHACHERE is a 36 y.o. male.  HPI Patient presents with right-sided neck pain. Began around 3 days ago. Worse with movements. No swelling. Constant. Has some swelling in his throat which is chronic. No fevers. No injury. Has not had pains like this before. He is a smoker. No difficulty speaking. No difficulty swallowing.   Past Medical History:  Diagnosis Date  . Acute angina ([place])   . Asthma   . CAD (coronary artery disease)   . Diabetes mellitus without complication ([place])   . Hypertension   . Seizure disorder ([place])   . Seizures [facility])     Patient Active Problem List   Diagnosis Date Noted  . MDD (major depressive disorder), recurrent severe, without psychosis ([place]) 05/11/2015  . Cannabis abuse 05/11/2015  . Substance induced mood disorder ([place]) 05/11/2015    Past Surgical History:  Procedure Laterality Date  . CHOLECYSTECTOMY    . EYE SURGERY    . FACIAL RECONSTRUCTION SURGERY         Home Medications    Prior to Admission medications   Medication Sig Start Date End Date Taking? Authorizing Provider  albuterol (PROVENTIL) (2.5 MG/3ML) 0.083% nebulizer solution Take 3 mLs (2.5 mg total) by nebulization every 6 (six) hours as needed for wheezing or shortness of breath. 02/12/15  Yes Tammy Triplett, PA-C  aspirin 81 MG tablet Take 81 mg by mouth daily.   Yes Historical Provider, MD  benazepril-hydrochlorthiazide (LOTENSIN HCT) 10-12.5 MG tablet Take 1 tablet by mouth daily. 07/10/15  Yes [name], MD  glimepiride (AMARYL) 4 MG tablet Take 4 mg by mouth 2 (two) times daily.    Yes Historical Provider, MD  metoprolol tartrate (LOPRESSOR) 25 MG tablet Take 25 mg by mouth 2 (two) times daily.   Yes Historical Provider, MD  nitroGLYCERIN (NITROSTAT) 0.4 MG SL tablet Place 1 tablet  (0.4 mg total) under the tongue every 5 (five) minutes as needed for chest pain. 07/10/15  Yes [name], MD  omeprazole (PRILOSEC) 20 MG capsule Take 20 mg by mouth daily.   Yes Historical Provider, MD  phenytoin (DILANTIN) 100 MG ER capsule Take 3 capsules (300 mg total) by mouth at bedtime. 09/17/15  Yes Nat Christen, MD  clindamycin (CLEOCIN) 150 MG capsule Take 2 capsules (300 mg total) by mouth every 6 (six) hours. Patient not taking: Reported on 07/24/2016 06/11/16   Merryl Hacker, MD  naproxen (NAPROSYN) 500 MG tablet Take 1 tablet (500 mg total) by mouth 2 (two) times daily. Patient not taking: Reported on 07/24/2016 06/11/16   Merryl Hacker, MD    Family History Family History  Problem Relation Age of Onset  . Diabetes Mother   . Hypertension Mother   . Heart failure Father   . Stroke Father   . Diabetes Father   . Heart failure Brother     Social History Social History  Substance Use Topics  . Smoking status: Current Every Day Smoker    Packs/day: 1.00    Years: 20.00    Types: Cigarettes  . Smokeless tobacco: Never Used  . Alcohol use Yes     Comment: occasionally     Allergies   Morphine and related; Penicillins; Tramadol; and Ibuprofen   Review of Systems  Review of Systems  Constitutional: Negative for appetite change and fever.  HENT: Negative for congestion.   Respiratory: Negative for shortness of breath.   Cardiovascular: Negative for chest pain.  Gastrointestinal: Negative for abdominal pain.  Endocrine: Negative for polyuria.  Genitourinary: Negative for dysuria.  Musculoskeletal: Positive for neck pain. Negative for joint swelling.  Neurological: Negative for numbness.  Hematological: Negative for adenopathy.  Psychiatric/Behavioral: Negative for confusion.     Physical Exam Updated Vital Signs BP 123/71 (BP Location: Left Arm)   Pulse 82   Temp 98.1 F (36.7 C) (Oral)   Resp 20   Ht 6' (1.829 m)   Wt 260 lb (117.9 kg)   SpO2 98%    BMI 35.26 kg/m   Physical Exam  Constitutional: He appears well-developed.  HENT:  Head: Atraumatic.  Mouth/Throat: No oropharyngeal exudate.  Patient has a large red beard  Neck: Neck supple.  Some tenderness to the right side of the neck. Is around the area of the right side of the hyoid bone. No swelling. Good range of motion. No crepitance. No ecchymosis. No bruit. No stridor.  Cardiovascular: Normal rate.   Pulmonary/Chest: Effort normal.  Abdominal: Soft. There is no tenderness.  Musculoskeletal: He exhibits no edema.  Neurological: He is alert.  Skin: Skin is warm.     ED Treatments / Results  Labs (all labs ordered are listed, but only abnormal results are displayed) Labs Reviewed  CBC WITH DIFFERENTIAL/PLATELET - Abnormal; Notable for the following:       Result Value   WBC 12.3 (*)    Lymphs Abs 5.4 (*)    All other components within normal limits  BASIC METABOLIC PANEL - Abnormal; Notable for the following:    Sodium 134 (*)    Glucose, Bld 141 (*)    Calcium 8.7 (*)    All other components within normal limits  SEDIMENTATION RATE  C-REACTIVE PROTEIN  I-STAT CREATININE, ED    EKG  EKG Interpretation None       Radiology Ct Soft Tissue Neck W Contrast  Result Date: 07/24/2016 CLINICAL DATA:  RIGHT neck mass popped 3 days ago. Enlarged tonsils and difficulty breathing. History of substance abuse, diabetes. EXAM: CT NECK WITH CONTRAST TECHNIQUE: Multidetector CT imaging of the neck was performed using the standard protocol following the bolus administration of intravenous contrast. CONTRAST:  4m ISOVUE-300 IOPAMIDOL (ISOVUE-300) INJECTION 61% COMPARISON:  None. FINDINGS: PHARYNX AND LARYNX: Palatine tonsillar hypertrophy without abnormal enhancement. Preservation of parapharyngeal fat planes. Patent airway. Normal larynx. SALIVARY GLANDS: Normal. THYROID: Normal. LYMPH NODES: Borderline reactive lymphadenopathy. VASCULAR: Fat stranding and trace effusion  RIGHT carotid space, mildly narrowing the RIGHT internal carotid artery. LIMITED INTRACRANIAL: Normal. VISUALIZED ORBITS: Normal. MASTOIDS AND VISUALIZED PARANASAL SINUSES: Mild paranasal sinus mucosal thickening without air-fluid levels. Imaged mastoid air cells are well aerated. SKELETON: Nonacute. Scattered dental caries and periapical lucency/abscess. UPPER CHEST: Lung apices are clear. No superior mediastinal lymphadenopathy. OTHER: Calcified stylohyoid ligaments. IMPRESSION: Inflammatory changes RIGHT carotid space can be seen with vasculitis/ carotidynia or dissection. Recommend correlation with ESR/ CRP and CBC. If inflammatory process suspected, MRI with contrast would be indicated. If dissection is suspected, CTA neck is recommended. Tonsillar hypertrophy without acute tonsillitis. Borderline reactive lymphadenopathy. Electronically Signed   By: CElon AlasM.D.   On: 07/24/2016 20:10    Procedures Procedures (including critical care time)  Medications Ordered in ED Medications  iopamidol (ISOVUE-300) 61 % injection 75 mL (75 mLs Intravenous Contrast Given 07/24/16 1926)  Initial Impression / Assessment and Plan / ED Course  I have reviewed the triage vital signs and the nursing notes.  Pertinent labs & imaging results that were available during my care of the patient were reviewed by me and considered in my medical decision making (see chart for details).     Patient with right-sided neck pain. CT with contrast done and showed some irritation around the carotid on the right side. White count minimally elevated but normal sedimentation rate. Reviewed report and CT. Outpatient CTA ordered to rule out dissection. However he did have contrast today so he cannot get the scan for over 24 hours. Outpatient scan ordered. Potentially could need MRI also. Primary care doctor, Dr. Karie Kirks to follow.  Final Clinical Impressions(s) / ED Diagnoses   Final diagnoses:  Neck pain    New  Prescriptions Discharge Medication List as of 07/24/2016 10:11 PM       Davonna Belling, MD 07/25/16 0021

## 2016-07-26 ENCOUNTER — Ambulatory Visit (HOSPITAL_COMMUNITY)
Admit: 2016-07-26 | Discharge: 2016-07-26 | Disposition: A | Discharge status: Home / Self Care | Payer: Medicare Other | Attending: Emergency Medicine | Admitting: Emergency Medicine

## 2016-07-26 DIAGNOSIS — R590 Localized enlarged lymph nodes: Secondary | ICD-10-CM | POA: Diagnosis not present

## 2016-07-26 DIAGNOSIS — J351 Hypertrophy of tonsils: Secondary | ICD-10-CM | POA: Insufficient documentation

## 2016-07-26 DIAGNOSIS — M542 Cervicalgia: Secondary | ICD-10-CM | POA: Diagnosis not present

## 2016-07-26 MED ORDER — IOPAMIDOL (ISOVUE-370) INJECTION 76%
75.0000 mL | Freq: Once | INTRAVENOUS | Status: AC | PRN
  Administered 2016-07-26: 75 mL via INTRAVENOUS

## 2016-07-26 NOTE — ED Provider Notes (Signed)
Reviewed CT angiogram of neck with vascular surgeon on-call.  He did not feel that this report was indicative of any life-threatening condition. I discussed the case with the patient on [phone]   Nat Christen, MD 07/26/16 [phone]

## 2016-07-27 NOTE — ED Provider Notes (Signed)
I called the patient this morning at his home.Marland Kitchen  He is still having neck pain. I encouraged the patient to return to the emergency department for reevaluation   Chad Christen, MD 07/27/16 [phone]

## 2016-08-05 ENCOUNTER — Other Ambulatory Visit (HOSPITAL_COMMUNITY): Payer: Self-pay

## 2016-09-30 DIAGNOSIS — I10 Essential (primary) hypertension: Secondary | ICD-10-CM | POA: Diagnosis not present

## 2016-09-30 DIAGNOSIS — G40409 Other generalized epilepsy and epileptic syndromes, not intractable, without status epilepticus: Secondary | ICD-10-CM | POA: Diagnosis not present

## 2016-09-30 DIAGNOSIS — E1165 Type 2 diabetes mellitus with hyperglycemia: Secondary | ICD-10-CM | POA: Diagnosis not present

## 2016-10-11 DIAGNOSIS — Z79899 Other long term (current) drug therapy: Secondary | ICD-10-CM | POA: Diagnosis not present

## 2016-10-11 DIAGNOSIS — E1165 Type 2 diabetes mellitus with hyperglycemia: Secondary | ICD-10-CM | POA: Diagnosis not present

## 2016-10-11 DIAGNOSIS — I10 Essential (primary) hypertension: Secondary | ICD-10-CM | POA: Diagnosis not present

## 2016-10-11 DIAGNOSIS — G40409 Other generalized epilepsy and epileptic syndromes, not intractable, without status epilepticus: Secondary | ICD-10-CM | POA: Diagnosis not present

## 2017-01-20 DIAGNOSIS — I10 Essential (primary) hypertension: Secondary | ICD-10-CM | POA: Diagnosis not present

## 2017-01-20 DIAGNOSIS — G40409 Other generalized epilepsy and epileptic syndromes, not intractable, without status epilepticus: Secondary | ICD-10-CM | POA: Diagnosis not present

## 2017-01-20 DIAGNOSIS — E1165 Type 2 diabetes mellitus with hyperglycemia: Secondary | ICD-10-CM | POA: Diagnosis not present

## 2017-01-20 DIAGNOSIS — F17210 Nicotine dependence, cigarettes, uncomplicated: Secondary | ICD-10-CM | POA: Diagnosis not present

## 2017-01-23 ENCOUNTER — Ambulatory Visit (INDEPENDENT_AMBULATORY_CARE_PROVIDER_SITE_OTHER): Payer: Medicare Other | Admitting: Adult Health

## 2017-01-23 ENCOUNTER — Encounter: Payer: Self-pay | Admitting: *Deleted

## 2017-01-23 ENCOUNTER — Encounter: Payer: Self-pay | Admitting: Adult Health

## 2017-01-23 VITALS — BP 140/76 | HR 84 | Ht 72.0 in | Wt 284.0 lb

## 2017-01-23 DIAGNOSIS — Z01818 Encounter for other preprocedural examination: Secondary | ICD-10-CM | POA: Diagnosis not present

## 2017-01-23 DIAGNOSIS — Z9119 Patient's noncompliance with other medical treatment and regimen: Secondary | ICD-10-CM | POA: Diagnosis not present

## 2017-01-23 DIAGNOSIS — Z720 Tobacco use: Secondary | ICD-10-CM | POA: Diagnosis not present

## 2017-01-23 DIAGNOSIS — R0602 Shortness of breath: Secondary | ICD-10-CM

## 2017-01-23 DIAGNOSIS — E1169 Type 2 diabetes mellitus with other specified complication: Secondary | ICD-10-CM | POA: Diagnosis not present

## 2017-01-23 DIAGNOSIS — R0789 Other chest pain: Secondary | ICD-10-CM | POA: Diagnosis not present

## 2017-01-23 DIAGNOSIS — Z91199 Patient's noncompliance with other medical treatment and regimen due to unspecified reason: Secondary | ICD-10-CM

## 2017-01-23 NOTE — Patient Instructions (Signed)
Medication Instructions:  Your physician recommends that you continue on your current medications as directed. Please refer to the Current Medication list given to you today.   Labwork: Your physician recommends that you return for lab work in: Fasting    Testing/Procedures: Your physician has requested that you have an echocardiogram. Echocardiography is a painless test that uses sound waves to create images of your heart. It provides your doctor with information about the size and shape of your heart and how well your heart's chambers and valves are working. This procedure takes approximately one hour. There are no restrictions for this procedure.  Your physician has requested that you have a lexiscan myoview. For further information please visit [email or web address]. Please follow instruction sheet, as given.    Follow-Up: Your physician recommends that you schedule a follow-up appointment after your test.    Any Other Special Instructions Will Be Listed Below (If Applicable).     If you need a refill on your cardiac medications before your next appointment, please call your pharmacy. Thank you for choosing [facility]!

## 2017-01-23 NOTE — Progress Notes (Signed)
Cardiology Office Note   Date:  01/23/2017   ID:  Chad Hampton, DOB 12/06/1980, MRN [id number]  PCP:  Lemmie Evens, MD  Cardiologist:  Dorris Carnes, MD  No chief complaint on file.     History of Present Illness: Chad Hampton is a 36 y.o. male who presents for ongoing assessment and management of chest pain, nonobstructive heart disease per cardiac catheterization, ongoing tobacco abuse, history of hypertension, asthma, history of syncope and seizure disorder. The patient was last seen in the office by Dr. Harrington Challenger on March 2017 the patient was scheduled for [facility]. No results are available, uncertain if patient had test.  He is here today for preoperative evaluation. The patient has not been taking any medication for over a year, he is also not showing up for any testing from primary care or cardiology. The patient continues to smoke a pack and a half a day of cigarettes, smokes marijuana every day throughout the day. He is due to have several teeth removed, but has complained of dyspnea on exertion and chest pressure. Prior to having all of this surgery he is being asked for evaluation.    Past Medical History:  Diagnosis Date  . Acute angina ([place])   . Asthma   . CAD (coronary artery disease)   . Diabetes mellitus without complication ([place])   . Hypertension   . Seizure disorder ([place])   . Seizures ([place])     Past Surgical History:  Procedure Laterality Date  . CHOLECYSTECTOMY    . EYE SURGERY    . FACIAL RECONSTRUCTION SURGERY       Current Outpatient Prescriptions  Medication Sig Dispense Refill  . aspirin 81 MG tablet Take 81 mg by mouth daily.    . benazepril (LOTENSIN) 10 MG tablet Take 10 mg by mouth daily.     . metoprolol tartrate (LOPRESSOR) 25 MG tablet Take 25 mg by mouth 2 (two) times daily.     No current facility-administered medications for this visit.     Allergies:   Morphine and related; Penicillins; Tramadol; and Ibuprofen    Social History:   The patient  reports that he has been smoking Cigarettes.  He has a 20.00 pack-year smoking history. He has never used smokeless tobacco. He reports that he drinks alcohol. He reports that he uses drugs, including Marijuana.   Family History:  The patient's family history includes Diabetes in his father and mother; Heart failure in his brother and father; Hypertension in his mother; Stroke in his father.    ROS: All other systems are reviewed and negative. Unless otherwise mentioned in H&P    PHYSICAL EXAM: VS:  BP 140/76 (BP Location: Right Arm)   Pulse 84   Ht 6' (1.829 m)   Wt 284 lb (128.8 kg)   SpO2 96%   BMI 38.52 kg/m  , BMI Body mass index is 38.52 kg/m. GEN: Well nourished, well developed, in no acute distress Obese HEENT: normal  Neck: no JVD, carotid bruits, or masses Cardiac: RRR; no murmurs, rubs, or gallops,no edema  Respiratory:  Clear to auscultation bilaterally, normal work of breathing GI: soft, nontender, nondistended, + BS MS: no deformity or atrophy  Skin: warm and dry, no rash Neuro:  Strength and sensation are intact Psych: euthymic mood, full affect   EKG:  Normal sinus rhythm heart rate of 85 bpm with poor interesting in that R-wave progression.  Recent Labs: 07/24/2016: BUN 10; Creatinine, Ser 0.99; Hemoglobin 15.5; Platelets 250;  Potassium 3.5; Sodium 134    Lipid Panel    Component Value Date/Time   CHOL 141 07/10/2015 1529   TRIG 310 (H) 07/10/2015 1529   HDL 17 (L) 07/10/2015 1529   CHOLHDL 8.3 (H) 07/10/2015 1529   VLDL 62 (H) 07/10/2015 1529   LDLCALC 62 07/10/2015 1529      Wt Readings from Last 3 Encounters:  01/23/17 284 lb (128.8 kg)  07/24/16 260 lb (117.9 kg)  06/11/16 245 lb (111.1 kg)      Other studies Reviewed: CORONARY ANGIOGRAPHY: Cardiac Cath 09/03/2011  Vessel SegmentVessel TypeStenosis (%)Description Comment LMCANative 0 Distal LADNative  25  3rd MarginalNative 25  1st RPL Native 0 Small vessel    [place] Present: Yes Coronary Dominance:Right  Lesions/Grafts Comments: - Normal right coronary artery    CONCLUSIONS:   CORONARY STATUS: Mild non-obstructive ASCAD   LV FUNCTION:  Ejection Fraction: 50%  Wall Motion: Normal    OTHER: - mild non-obstructive CAD - low normal LV systolic function - right radial arteriotomy  ASSESSMENT AND PLAN:  1. CAD: Cardiac catheterization completed 09/03/2011 demonstrated nonobstructive CAD with 25% stenosis in the distal LAD and 25% stenosis in the third marginal, EF of 50%. He has complaints of worsening dyspnea on exertion, chest pressure. He was scheduled to have a stress test 2 years ago when he was seen last but he never showed for the test.  Due to symptoms with cardiovascular risk factors to include hypertension, hypercholesterolemia, family history, ongoing tobacco abuse, diabetes, and illicit drug use, the patient will be scheduled for echocardiogram and Lexiscan Myoview, as he is due to have anesthesia for significant full lower jaw teeth extraction. I've explained this to the patient who verbalizes understanding. I've emphasized the fact that he will need to show up for the testing so that we can fully evaluate him from a cardiovascular standpoint prior to anesthesia with his symptoms.  2. Diabetes: He is now not taking any medications whatsoever. Uncertain what his status is. Hemoglobin A1c will be checked along with CBC and BMET.  3. Ongoing tobacco abuse: The patient continues to smoke over a pack a day and smokes marijuana throughout the day. I've explained to him that this is dangerous for his health and will worsen he symptoms of chest pain and dyspnea. This can also lead to significant  worsening of known CAD. He verbalizes understanding, but does not appear to wish to make any last L changes at this time.  4. Pre-Operative Evaluation for Extensive Teeth extraction: Plan as above.   Current medicines are reviewed at length with the patient today.    Labs/ tests ordered today include:Lexisan stress test, echocardiogram BMET, CBC, Lipid panel and Hgb A1C.   Phill Myron. West Pugh, ANP, AACC   01/23/2017 2:31 PM    [facility] [address], [place],  [address] Phone: [phone]; Fax: [phone]

## 2017-01-28 ENCOUNTER — Ambulatory Visit (HOSPITAL_COMMUNITY)
Admission: RE | Admit: 2017-01-28 | Discharge: 2017-01-28 | Disposition: A | Discharge status: Home / Self Care | Payer: Medicare Other | Source: Ambulatory Visit | Attending: Adult Health | Admitting: Adult Health

## 2017-01-28 ENCOUNTER — Encounter (HOSPITAL_COMMUNITY)
Admission: RE | Admit: 2017-01-28 | Discharge: 2017-01-28 | Disposition: A | Discharge status: Home / Self Care | Payer: Medicare Other | Source: Ambulatory Visit | Attending: Adult Health | Admitting: Adult Health

## 2017-01-28 ENCOUNTER — Inpatient Hospital Stay (HOSPITAL_COMMUNITY): Admission: RE | Admit: 2017-01-28 | Payer: Self-pay | Source: Ambulatory Visit

## 2017-01-28 ENCOUNTER — Encounter (HOSPITAL_COMMUNITY): Payer: Self-pay

## 2017-01-28 DIAGNOSIS — I2510 Atherosclerotic heart disease of native coronary artery without angina pectoris: Secondary | ICD-10-CM | POA: Diagnosis not present

## 2017-01-28 DIAGNOSIS — F17210 Nicotine dependence, cigarettes, uncomplicated: Secondary | ICD-10-CM | POA: Diagnosis not present

## 2017-01-28 DIAGNOSIS — F329 Major depressive disorder, single episode, unspecified: Secondary | ICD-10-CM | POA: Insufficient documentation

## 2017-01-28 DIAGNOSIS — R0789 Other chest pain: Secondary | ICD-10-CM | POA: Insufficient documentation

## 2017-01-28 DIAGNOSIS — Z01818 Encounter for other preprocedural examination: Secondary | ICD-10-CM | POA: Diagnosis not present

## 2017-01-28 DIAGNOSIS — R569 Unspecified convulsions: Secondary | ICD-10-CM | POA: Diagnosis not present

## 2017-01-28 DIAGNOSIS — E119 Type 2 diabetes mellitus without complications: Secondary | ICD-10-CM | POA: Diagnosis not present

## 2017-01-28 DIAGNOSIS — I351 Nonrheumatic aortic (valve) insufficiency: Secondary | ICD-10-CM | POA: Insufficient documentation

## 2017-01-28 DIAGNOSIS — I10 Essential (primary) hypertension: Secondary | ICD-10-CM | POA: Insufficient documentation

## 2017-01-28 LAB — NM MYOCAR MULTI W/SPECT W/WALL MOTION / EF
CHL CUP NUCLEAR SSS: 8
CHL CUP RESTING HR STRESS: 74 {beats}/min
LHR: 0.39
LVDIAVOL: 137 mL (ref 62–150)
LVSYSVOL: 72 mL
NUC STRESS TID: 1.26
Peak HR: 106 {beats}/min
SDS: 6
SRS: 2

## 2017-01-28 MED ORDER — TECHNETIUM TC 99M TETROFOSMIN IV KIT
10.0000 | PACK | Freq: Once | INTRAVENOUS | Status: AC | PRN
  Administered 2017-01-28: 9.8 via INTRAVENOUS

## 2017-01-28 MED ORDER — SODIUM CHLORIDE 0.9% FLUSH
INTRAVENOUS | Status: AC
  Administered 2017-01-28: 10 mL via INTRAVENOUS
  Filled 2017-01-28: qty 10

## 2017-01-28 MED ORDER — TECHNETIUM TC 99M TETROFOSMIN IV KIT
30.0000 | PACK | Freq: Once | INTRAVENOUS | Status: AC | PRN
  Administered 2017-01-28: 30 via INTRAVENOUS

## 2017-01-28 MED ORDER — REGADENOSON 0.4 MG/5ML IV SOLN
INTRAVENOUS | Status: AC
  Administered 2017-01-28: 0.4 mg via INTRAVENOUS
  Filled 2017-01-28: qty 5

## 2017-01-28 NOTE — Progress Notes (Signed)
*  PRELIMINARY RESULTS* Echocardiogram 2D Echocardiogram has been performed.  Leavy Cella 01/28/2017, 1:13 PM

## 2017-01-29 ENCOUNTER — Other Ambulatory Visit (HOSPITAL_COMMUNITY): Payer: Self-pay

## 2017-01-29 ENCOUNTER — Encounter (HOSPITAL_COMMUNITY): Payer: Medicare Other

## 2017-01-29 ENCOUNTER — Encounter (HOSPITAL_COMMUNITY): Payer: Self-pay

## 2017-01-31 ENCOUNTER — Ambulatory Visit: Payer: Self-pay | Admitting: Adult Health

## 2017-02-03 NOTE — Progress Notes (Signed)
Cardiology Office Note                                      History and Physical                                                                                                            Date:  02/04/2017   ID:  Chad Hampton, MRN [id number]  PCP:  Chad Evens, MD  Cardiologist: Chad Carnes, MD  Chief Complaint  Patient presents with  . Shortness of Breath  . Chest Pain    History of Present Illness: Chad Hampton is a 36 y.o. male who presents for ongoing assessment and management of nonobstructive CAD per cardiac catheterization, hypertension, history of syncope and seizure disorder, with history of ongoing tobacco abuse and chronic chest pain. He was last seen in the office for preoperative evaluation on 01/23/2017. He.been taking any medications for several years nor had he been seen by cardiology since 2013. Due to symptoms with cardiovascular risk factors of hypertension and hypercholesterolemia family history ongoing tobacco abuse diabetes and illicit drug use the patient was scheduled for echocardiogram and Lexiscan stress test for reevaluation of his cardiac status and preoperative risk stratification  NM Stress Test 01/28/2017 Study Result    There was no ST segment deviation noted during stress.  Defect 1: There is a defect of moderate severity present in the basal anteroseptal, basal inferoseptal, mid anteroseptal, mid inferoseptal, apical anterior, apical septal and apical inferior location.  Findings consistent with prior myocardial infarction with peri-infarct ischemia.  This is an intermediate risk study.  Nuclear stress EF: 47%.    Echocardiogram 01/28/2017 Left ventricle: The cavity size was normal. Systolic function was   normal. The estimated ejection fraction was in the range of 60%   to 65%. Wall motion was normal; there were no regional wall   motion abnormalities. Left ventricular diastolic function   parameters were normal. - Aortic  valve: There was mild regurgitation   Labs were ordered but no results are found.   He comes today with recurrent substernal chest pain radiating through to his back. He has not filled his Rx for BB or lisinopril as prescribed on last office visit.    Past Medical History:  Diagnosis Date  . Acute angina ([facility])   . Asthma   . CAD (coronary artery disease)   . Diabetes mellitus without complication ([place])   . Hypertension   . Seizure disorder ([place])   . Seizures ([facility])     Past Surgical History:  Procedure Laterality Date  . CHOLECYSTECTOMY    . EYE SURGERY    . FACIAL RECONSTRUCTION SURGERY       Current Outpatient Prescriptions  Medication Sig Dispense Refill  . aspirin 81 MG tablet Take 81 mg by mouth daily.    Marland Kitchen lisinopril (PRINIVIL,ZESTRIL) 2.5 MG tablet Take 1 tablet (2.5 mg total) by mouth daily. 30 tablet 3  .  metoprolol succinate (TOPROL XL) 25 MG 24 hr tablet Take 1 tablet (25 mg total) by mouth daily. 30 tablet 3  . nitroGLYCERIN (NITROSTAT) 0.4 MG SL tablet Place 1 tablet (0.4 mg total) under the tongue every 5 (five) minutes as needed. 25 tablet 3   No current facility-administered medications for this visit.     Allergies:   Morphine and related; Penicillins; Tramadol; and Ibuprofen    Social History:  The patient  reports that he has been smoking Cigarettes.  He has a 20.00 pack-year smoking history. He has never used smokeless tobacco. He reports that he drinks alcohol. He reports that he uses drugs, including Marijuana.   Family History:  The patient's family history includes Diabetes in his father and mother; Heart failure in his brother and father; Hypertension in his mother; Stroke in his father.    ROS: All other systems are reviewed and negative. Unless otherwise mentioned in H&P    PHYSICAL EXAM: VS:  BP (!) 138/98   Pulse 75   Ht 6' (1.829 m)   Wt 278 lb (126.1 kg)   SpO2 98%   BMI 37.70 kg/m  , BMI Body mass index is 37.7 kg/m. GEN: Well  nourished, well developed, in no acute distress  HEENT: normal  Neck: no JVD, carotid bruits, or masses Cardiac: RRR; no murmurs, rubs, or gallops,no edema  Respiratory: Inspiratory wheezing is noted.  GI: soft, nontender, nondistended, + BS MS: no deformity or atrophy  Skin: warm and dry, no rash Neuro:  Strength and sensation are intact Psych: euthymic mood, full affect   Recent Labs: 07/24/2016: BUN 10; Creatinine, Ser 0.99; Hemoglobin 15.5; Platelets 250; Potassium 3.5; Sodium 134    Lipid Panel    Component Value Date/Time   CHOL 141 07/10/2015 1529   TRIG 310 (H) 07/10/2015 1529   HDL 17 (L) 07/10/2015 1529   CHOLHDL 8.3 (H) 07/10/2015 1529   VLDL 62 (H) 07/10/2015 1529   LDLCALC 62 07/10/2015 1529      Wt Readings from Last 3 Encounters:  02/04/17 278 lb (126.1 kg)  01/23/17 284 lb (128.8 kg)  07/24/16 260 lb (117.9 kg)      Other studies Reviewed: CORONARY ANGIOGRAPHY: Cardiac Cath 09/03/2011 Vessel SegmentVessel TypeStenosis (%)Description Comment LMCANative 0 Distal LADNative 25  3rd MarginalNative 25  1st RPL Native 0 Small vessel  Ramus Present: Yes Coronary Dominance:Right Lesions/Grafts Comments: - Normal right coronary artery  CONCLUSIONS:  CORONARY STATUS: Mild non-obstructive ASCAD   LV FUNCTION:  Ejection Fraction: 50%  Wall Motion: Normal   OTHER: - mild non-obstructive CAD - low normal LV systolic function - right radial arteriotomy    ASSESSMENT AND PLAN:  1. Abnormal NM stress test: Found to have defect indicative of old MI<  with peri-infarct ischemia. Ongoing chest pain. I have discussed repeat cardiac cath with this patient. The patient understands that risks include but are not limited  to stroke (1 in 1000), death (1 in 30), kidney failure [usually temporary] (1 in 500), bleeding (1 in 200), allergic reaction [possibly serious] (1 in 200), and agrees to proceed. I have also discussed this with Dr. Johnsie Cancel who is in the office today.   The patient is in agreement to proceed with cardiac cath. I do have concerns about compliance issues, in light of previous non-compliance, no medications for over a year, and forgetfulness. I have explained to him the importance of taking medications as directed, especially if intervention is necessary requiring stent placement. He will  need to be on DAPT. Consider BMS if this is the case.  Will defer to interventionist for recommendations.   In the interim, I have reminded him to fill his Rx for medications started back on last office visit.  I will start him on metoprolol succinate 25 mg daily now for daily dosing. He will be given another Rx for lisinopril 2.5 mg daily, atorvastatin 80 mg daily, and given ASA 81 mg daily.   Pre-cath labs will also include Hgb A1C.      2.  CAD: Last cardiac cath in 2013 demonstrated 255 stenosis of the LAD, and third marginal,. Start him back on statin, ASA, ACE and BB until cath completed. More medication adjustment recommendations post procedure.    3. Ongoing Tobacco Abuse: Cessation counseling is completed once again. He verbalizes understanding.   4. Hx of Diabetes; Hgb A1C is ordered with pre-cath labs. He will need to see PCP for this management.     Current medicines are reviewed at length with the patient today.    Labs/ tests ordered today include: Pre cath labs and orders.  Phill Myron. West Pugh, ANP, AACC   02/04/2017 2:30 PM    [facility] [address], [place], [place] [address] Phone: [phone]; Fax: [phone]

## 2017-02-04 ENCOUNTER — Other Ambulatory Visit (HOSPITAL_COMMUNITY)
Admission: RE | Admit: 2017-02-04 | Discharge: 2017-02-04 | Disposition: A | Discharge status: Home / Self Care | Payer: Medicare Other | Source: Ambulatory Visit | Attending: Adult Health | Admitting: Adult Health

## 2017-02-04 ENCOUNTER — Encounter: Payer: Self-pay | Admitting: Adult Health

## 2017-02-04 ENCOUNTER — Ambulatory Visit (HOSPITAL_COMMUNITY)
Admission: RE | Admit: 2017-02-04 | Discharge: 2017-02-04 | Disposition: A | Discharge status: Home / Self Care | Payer: Medicare Other | Source: Ambulatory Visit | Attending: Adult Health | Admitting: Adult Health

## 2017-02-04 ENCOUNTER — Ambulatory Visit (INDEPENDENT_AMBULATORY_CARE_PROVIDER_SITE_OTHER): Payer: Medicare Other | Admitting: Adult Health

## 2017-02-04 VITALS — BP 138/98 | HR 75 | Ht 72.0 in | Wt 278.0 lb

## 2017-02-04 DIAGNOSIS — Z01818 Encounter for other preprocedural examination: Secondary | ICD-10-CM

## 2017-02-04 DIAGNOSIS — R079 Chest pain, unspecified: Secondary | ICD-10-CM

## 2017-02-04 DIAGNOSIS — E7800 Pure hypercholesterolemia, unspecified: Secondary | ICD-10-CM | POA: Diagnosis not present

## 2017-02-04 DIAGNOSIS — I209 Angina pectoris, unspecified: Secondary | ICD-10-CM

## 2017-02-04 DIAGNOSIS — Z720 Tobacco use: Secondary | ICD-10-CM

## 2017-02-04 DIAGNOSIS — R9439 Abnormal result of other cardiovascular function study: Secondary | ICD-10-CM

## 2017-02-04 DIAGNOSIS — I25118 Atherosclerotic heart disease of native coronary artery with other forms of angina pectoris: Secondary | ICD-10-CM

## 2017-02-04 LAB — CBC WITH DIFFERENTIAL/PLATELET
Basophils Absolute: 0 10*3/uL (ref 0.0–0.1)
Basophils Relative: 0 %
Eosinophils Absolute: 0.5 10*3/uL (ref 0.0–0.7)
Eosinophils Relative: 3 %
HEMATOCRIT: 46.5 % (ref 39.0–52.0)
HEMOGLOBIN: 16 g/dL (ref 13.0–17.0)
LYMPHS ABS: 4.8 10*3/uL — AB (ref 0.7–4.0)
LYMPHS PCT: 36 %
MCH: 31.1 pg (ref 26.0–34.0)
MCHC: 34.4 g/dL (ref 30.0–36.0)
MCV: 90.5 fL (ref 78.0–100.0)
Monocytes Absolute: 0.6 10*3/uL (ref 0.1–1.0)
Monocytes Relative: 4 %
NEUTROS ABS: 7.4 10*3/uL (ref 1.7–7.7)
NEUTROS PCT: 57 %
Platelets: 239 10*3/uL (ref 150–400)
RBC: 5.14 MIL/uL (ref 4.22–5.81)
RDW: 13.2 % (ref 11.5–15.5)
WBC: 13.2 10*3/uL — AB (ref 4.0–10.5)

## 2017-02-04 LAB — BASIC METABOLIC PANEL
ANION GAP: 9 (ref 5–15)
BUN: 7 mg/dL (ref 6–20)
CHLORIDE: 104 mmol/L (ref 101–111)
CO2: 23 mmol/L (ref 22–32)
Calcium: 8.9 mg/dL (ref 8.9–10.3)
Creatinine, Ser: 0.98 mg/dL (ref 0.61–1.24)
GFR calc non Af Amer: >60 mL/min (ref >60)
Glucose, Bld: 119 mg/dL — ABNORMAL HIGH (ref 65–99)
Potassium: 3.7 mmol/L (ref 3.5–5.1)
SODIUM: 136 mmol/L (ref 135–145)

## 2017-02-04 LAB — HEMOGLOBIN A1C
Hgb A1c MFr Bld: 5.9 % — ABNORMAL HIGH (ref 4.8–5.6)
Mean Plasma Glucose: 122.63 mg/dL

## 2017-02-04 LAB — PROTIME-INR
INR: 0.94
PROTHROMBIN TIME: 12.4 s (ref 11.4–15.2)

## 2017-02-04 MED ORDER — LISINOPRIL 2.5 MG PO TABS: 2.5000 mg | ORAL_TABLET | Freq: Every day | ORAL | 3 refills | Status: DC

## 2017-02-04 MED ORDER — METOPROLOL SUCCINATE ER 25 MG PO TB24: 25.0000 mg | ORAL_TABLET | Freq: Every day | ORAL | 3 refills | Status: DC

## 2017-02-04 MED ORDER — ATORVASTATIN CALCIUM 80 MG PO TABS: 80.0000 mg | ORAL_TABLET | Freq: Every day | ORAL | 3 refills | Status: DC

## 2017-02-04 MED ORDER — NITROGLYCERIN 0.4 MG SL SUBL: 0.4000 mg | SUBLINGUAL_TABLET | SUBLINGUAL | 3 refills | Status: DC | PRN

## 2017-02-04 NOTE — Patient Instructions (Addendum)
Your physician recommends that you schedule a follow-up appointment in: 1 month with Dr.Lawrence     Take  Toprol XL 25 mg daily   Take Nitroglycerine as directed for chest pain   Take Aspirin 81 mg daily   Take Lisinopril 2.5 mg daily for blood pressure   Take atorvastatin 80 mg at dinner for cholesterol      Get lab work today and chest x-ray      See Heart Cath Instructions provided -to Arrive At 8 am on Friday 10/12 with Dr.Varanasi at 10:30 (cath instruction sheet corrected time to 8 am arrival)       Thank you for choosing [facility] !

## 2017-02-04 NOTE — Addendum Note (Signed)
Addended by: Lendon Colonel on: 02/04/2017 03:07 PM   Modules accepted: Orders, SmartSet

## 2017-02-05 ENCOUNTER — Telehealth: Payer: Self-pay

## 2017-02-05 NOTE — Telephone Encounter (Signed)
Call went to woman's voice.  No DPR on file.  Left message with this nurse name and # requesting call back if Pt has any questions/concerns regarding catheterization on Friday.

## 2017-02-07 ENCOUNTER — Ambulatory Visit (HOSPITAL_COMMUNITY)
Admission: RE | Admit: 2017-02-07 | Discharge: 2017-02-07 | Disposition: A | Discharge status: Home / Self Care | Payer: Medicare Other | Source: Ambulatory Visit | Attending: Interventional Cardiology | Admitting: Interventional Cardiology

## 2017-02-07 ENCOUNTER — Encounter (HOSPITAL_COMMUNITY)
Admission: RE | Disposition: A | Discharge status: Home / Self Care | Payer: Self-pay | Source: Ambulatory Visit | Attending: Interventional Cardiology

## 2017-02-07 ENCOUNTER — Encounter (HOSPITAL_COMMUNITY): Payer: Self-pay | Admitting: Interventional Cardiology

## 2017-02-07 DIAGNOSIS — F17210 Nicotine dependence, cigarettes, uncomplicated: Secondary | ICD-10-CM | POA: Insufficient documentation

## 2017-02-07 DIAGNOSIS — R55 Syncope and collapse: Secondary | ICD-10-CM | POA: Diagnosis not present

## 2017-02-07 DIAGNOSIS — I2510 Atherosclerotic heart disease of native coronary artery without angina pectoris: Secondary | ICD-10-CM | POA: Insufficient documentation

## 2017-02-07 DIAGNOSIS — G8929 Other chronic pain: Secondary | ICD-10-CM | POA: Insufficient documentation

## 2017-02-07 DIAGNOSIS — E119 Type 2 diabetes mellitus without complications: Secondary | ICD-10-CM | POA: Diagnosis not present

## 2017-02-07 DIAGNOSIS — Z01818 Encounter for other preprocedural examination: Secondary | ICD-10-CM

## 2017-02-07 DIAGNOSIS — Z885 Allergy status to narcotic agent status: Secondary | ICD-10-CM | POA: Insufficient documentation

## 2017-02-07 DIAGNOSIS — R9439 Abnormal result of other cardiovascular function study: Secondary | ICD-10-CM

## 2017-02-07 DIAGNOSIS — I10 Essential (primary) hypertension: Secondary | ICD-10-CM | POA: Insufficient documentation

## 2017-02-07 DIAGNOSIS — Z7982 Long term (current) use of aspirin: Secondary | ICD-10-CM | POA: Diagnosis not present

## 2017-02-07 DIAGNOSIS — G40909 Epilepsy, unspecified, not intractable, without status epilepticus: Secondary | ICD-10-CM | POA: Insufficient documentation

## 2017-02-07 DIAGNOSIS — Z79899 Other long term (current) drug therapy: Secondary | ICD-10-CM | POA: Diagnosis not present

## 2017-02-07 DIAGNOSIS — Z880 Allergy status to penicillin: Secondary | ICD-10-CM | POA: Diagnosis not present

## 2017-02-07 HISTORY — PX: LEFT HEART CATH AND CORONARY ANGIOGRAPHY: CATH118249

## 2017-02-07 LAB — GLUCOSE, CAPILLARY: Glucose-Capillary: 94 mg/dL (ref 65–99)

## 2017-02-07 SURGERY — LEFT HEART CATH AND CORONARY ANGIOGRAPHY
Anesthesia: LOCAL

## 2017-02-07 MED ORDER — VERAPAMIL HCL 2.5 MG/ML IV SOLN
INTRAVENOUS | Status: AC
  Filled 2017-02-07: qty 2

## 2017-02-07 MED ORDER — HEPARIN (PORCINE) IN NACL 2-0.9 UNIT/ML-% IJ SOLN
INTRAMUSCULAR | Status: AC
  Filled 2017-02-07: qty 1000

## 2017-02-07 MED ORDER — MIDAZOLAM HCL 2 MG/2ML IJ SOLN
INTRAMUSCULAR | Status: DC | PRN
  Administered 2017-02-07: 2 mg via INTRAVENOUS
  Administered 2017-02-07: 1 mg via INTRAVENOUS

## 2017-02-07 MED ORDER — FENTANYL CITRATE (PF) 100 MCG/2ML IJ SOLN
INTRAMUSCULAR | Status: AC
  Filled 2017-02-07: qty 2

## 2017-02-07 MED ORDER — HEPARIN SODIUM (PORCINE) 1000 UNIT/ML IJ SOLN
INTRAMUSCULAR | Status: AC
  Filled 2017-02-07: qty 1

## 2017-02-07 MED ORDER — IOPAMIDOL (ISOVUE-370) INJECTION 76%
INTRAVENOUS | Status: DC | PRN
  Administered 2017-02-07: 40 mL via INTRA_ARTERIAL

## 2017-02-07 MED ORDER — VERAPAMIL HCL 2.5 MG/ML IV SOLN
INTRAVENOUS | Status: DC | PRN
  Administered 2017-02-07: 10 mL via INTRA_ARTERIAL

## 2017-02-07 MED ORDER — SODIUM CHLORIDE 0.9 % WEIGHT BASED INFUSION
3.0000 mL/kg/h | INTRAVENOUS | Status: AC
  Administered 2017-02-07: 3 mL/kg/h via INTRAVENOUS

## 2017-02-07 MED ORDER — SODIUM CHLORIDE 0.9% FLUSH: 3.0000 mL | Freq: Two times a day (BID) | INTRAVENOUS | Status: DC

## 2017-02-07 MED ORDER — HEPARIN SODIUM (PORCINE) 1000 UNIT/ML IJ SOLN
INTRAMUSCULAR | Status: DC | PRN
  Administered 2017-02-07: 6000 [IU] via INTRAVENOUS

## 2017-02-07 MED ORDER — NITROGLYCERIN 0.4 MG SL SUBL
SUBLINGUAL_TABLET | SUBLINGUAL | Status: AC
  Administered 2017-02-07: 0.4 mg via SUBLINGUAL
  Filled 2017-02-07: qty 3

## 2017-02-07 MED ORDER — SODIUM CHLORIDE 0.9% FLUSH: 3.0000 mL | INTRAVENOUS | Status: DC | PRN

## 2017-02-07 MED ORDER — SODIUM CHLORIDE 0.9 % IV SOLN: 250.0000 mL | INTRAVENOUS | Status: DC | PRN

## 2017-02-07 MED ORDER — SODIUM CHLORIDE 0.9 % WEIGHT BASED INFUSION: 1.0000 mL/kg/h | INTRAVENOUS | Status: DC

## 2017-02-07 MED ORDER — SODIUM CHLORIDE 0.9% FLUSH
3.0000 mL | INTRAVENOUS | Status: DC | PRN
Start: 2017-02-07 — End: 2017-02-07

## 2017-02-07 MED ORDER — SODIUM CHLORIDE 0.9 % IV SOLN: INTRAVENOUS | Status: AC

## 2017-02-07 MED ORDER — MIDAZOLAM HCL 2 MG/2ML IJ SOLN
INTRAMUSCULAR | Status: AC
  Filled 2017-02-07: qty 2

## 2017-02-07 MED ORDER — IOPAMIDOL (ISOVUE-370) INJECTION 76%
INTRAVENOUS | Status: AC
  Filled 2017-02-07: qty 100

## 2017-02-07 MED ORDER — LIDOCAINE HCL 2 % IJ SOLN
INTRAMUSCULAR | Status: DC | PRN
  Administered 2017-02-07: 2 mL via INTRADERMAL

## 2017-02-07 MED ORDER — ASPIRIN 81 MG PO CHEW: 81.0000 mg | CHEWABLE_TABLET | ORAL | Status: DC

## 2017-02-07 MED ORDER — FENTANYL CITRATE (PF) 100 MCG/2ML IJ SOLN
INTRAMUSCULAR | Status: DC | PRN
  Administered 2017-02-07: 50 ug via INTRAVENOUS
  Administered 2017-02-07: 25 ug via INTRAVENOUS

## 2017-02-07 MED ORDER — LIDOCAINE HCL 2 % IJ SOLN
INTRAMUSCULAR | Status: AC
  Filled 2017-02-07: qty 10

## 2017-02-07 MED ORDER — HEPARIN (PORCINE) IN NACL 2-0.9 UNIT/ML-% IJ SOLN
INTRAMUSCULAR | Status: AC | PRN
  Administered 2017-02-07: 1000 mL

## 2017-02-07 MED ORDER — NITROGLYCERIN 0.4 MG SL SUBL
0.4000 mg | SUBLINGUAL_TABLET | SUBLINGUAL | Status: DC | PRN
  Administered 2017-02-07: 0.4 mg via SUBLINGUAL

## 2017-02-07 SURGICAL SUPPLY — 10 items
CATH INFINITI 5 FR JL3.5 (CATHETERS) ×2 IMPLANT
CATH INFINITI JR4 5F (CATHETERS) ×2 IMPLANT
DEVICE RAD COMP TR BAND LRG (VASCULAR PRODUCTS) ×2 IMPLANT
GLIDESHEATH SLEND SS 6F .021 (SHEATH) ×2 IMPLANT
GUIDEWIRE INQWIRE 1.5J.035X260 (WIRE) ×1 IMPLANT
INQWIRE 1.5J .035X260CM (WIRE) ×2
KIT HEART LEFT (KITS) ×2 IMPLANT
PACK CARDIAC CATHETERIZATION (CUSTOM PROCEDURE TRAY) ×2 IMPLANT
TRANSDUCER W/STOPCOCK (MISCELLANEOUS) ×2 IMPLANT
TUBING CIL FLEX 10 FLL-RA (TUBING) ×2 IMPLANT

## 2017-02-07 NOTE — Interval H&P Note (Signed)
Cath Lab Visit (complete for each Cath Lab visit)  Clinical Evaluation Leading to the Procedure:   ACS: No.  Non-ACS:    Anginal Classification: CCS III  Anti-ischemic medical therapy: Minimal Therapy (1 class of medications)  Non-Invasive Test Results: Intermediate-risk stress test findings: cardiac mortality 1-3%/year  Prior CABG: No previous CABG      History and Physical Interval Note:  02/07/2017 10:01 AM  Chad Hampton  has presented today for surgery, with the diagnosis of abnormal nuc - cp   The various methods of treatment have been discussed with the patient and family. After consideration of risks, benefits and other options for treatment, the patient has consented to  Procedure(s): LEFT HEART CATH AND CORONARY ANGIOGRAPHY (N/A) as a surgical intervention .  The patient's history has been reviewed, patient examined, no change in status, stable for surgery.  I have reviewed the patient's chart and labs.  Questions were answered to the patient's satisfaction.     Larae Grooms

## 2017-02-07 NOTE — Discharge Instructions (Signed)

## 2017-02-07 NOTE — H&P (View-Only) (Signed)
Cardiology Office Note                                      History and Physical                                                                                                            Date:  02/04/2017   ID:  Chad Hampton, DOB Feb 08, 1981, MRN [id number]  PCP:  Lemmie Evens, MD  Cardiologist: Dorris Carnes, MD  Chief Complaint  Patient presents with  . Shortness of Breath  . Chest Pain    History of Present Illness: Chad Hampton is a 36 y.o. male who presents for ongoing assessment and management of nonobstructive CAD per cardiac catheterization, hypertension, history of syncope and seizure disorder, with history of ongoing tobacco abuse and chronic chest pain. He was last seen in the office for preoperative evaluation on 01/23/2017. He.been taking any medications for several years nor had he been seen by cardiology since 2013. Due to symptoms with cardiovascular risk factors of hypertension and hypercholesterolemia family history ongoing tobacco abuse diabetes and illicit drug use the patient was scheduled for echocardiogram and Lexiscan stress test for reevaluation of his cardiac status and preoperative risk stratification  NM Stress Test 01/28/2017 Study Result    There was no ST segment deviation noted during stress.  Defect 1: There is a defect of moderate severity present in the basal anteroseptal, basal inferoseptal, mid anteroseptal, mid inferoseptal, apical anterior, apical septal and apical inferior location.  Findings consistent with prior myocardial infarction with peri-infarct ischemia.  This is an intermediate risk study.  Nuclear stress EF: 47%.    Echocardiogram 01/28/2017 Left ventricle: The cavity size was normal. Systolic function was   normal. The estimated ejection fraction was in the range of 60%   to 65%. Wall motion was normal; there were no regional wall   motion abnormalities. Left ventricular diastolic function   parameters were normal. - Aortic  valve: There was mild regurgitation   Labs were ordered but no results are found.   He comes today with recurrent substernal chest pain radiating through to his back. He has not filled his Rx for BB or lisinopril as prescribed on last office visit.    Past Medical History:  Diagnosis Date  . Acute angina ([facility])   . Asthma   . CAD (coronary artery disease)   . Diabetes mellitus without complication ([facility])   . Hypertension   . Seizure disorder ([facility])   . Seizures ([facility])     Past Surgical History:  Procedure Laterality Date  . CHOLECYSTECTOMY    . EYE SURGERY    . FACIAL RECONSTRUCTION SURGERY       Current Outpatient Prescriptions  Medication Sig Dispense Refill  . aspirin 81 MG tablet Take 81 mg by mouth daily.    Marland Kitchen lisinopril (PRINIVIL,ZESTRIL) 2.5 MG tablet Take 1 tablet (2.5 mg total) by mouth daily. 30 tablet 3  .  metoprolol succinate (TOPROL XL) 25 MG 24 hr tablet Take 1 tablet (25 mg total) by mouth daily. 30 tablet 3  . nitroGLYCERIN (NITROSTAT) 0.4 MG SL tablet Place 1 tablet (0.4 mg total) under the tongue every 5 (five) minutes as needed. 25 tablet 3   No current facility-administered medications for this visit.     Allergies:   Morphine and related; Penicillins; Tramadol; and Ibuprofen    Social History:  The patient  reports that he has been smoking Cigarettes.  He has a 20.00 pack-year smoking history. He has never used smokeless tobacco. He reports that he drinks alcohol. He reports that he uses drugs, including Marijuana.   Family History:  The patient's family history includes Diabetes in his father and mother; Heart failure in his brother and father; Hypertension in his mother; Stroke in his father.    ROS: All other systems are reviewed and negative. Unless otherwise mentioned in H&P    PHYSICAL EXAM: VS:  BP (!) 138/98   Pulse 75   Ht 6' (1.829 m)   Wt 278 lb (126.1 kg)   SpO2 98%   BMI 37.70 kg/m  , BMI Body mass index is 37.7 kg/m. GEN: Well  nourished, well developed, in no acute distress  HEENT: normal  Neck: no JVD, carotid bruits, or masses Cardiac: RRR; no murmurs, rubs, or gallops,no edema  Respiratory: Inspiratory wheezing is noted.  GI: soft, nontender, nondistended, + BS MS: no deformity or atrophy  Skin: warm and dry, no rash Neuro:  Strength and sensation are intact Psych: euthymic mood, full affect   Recent Labs: 07/24/2016: BUN 10; Creatinine, Ser 0.99; Hemoglobin 15.5; Platelets 250; Potassium 3.5; Sodium 134    Lipid Panel    Component Value Date/Time   CHOL 141 07/10/2015 1529   TRIG 310 (H) 07/10/2015 1529   HDL 17 (L) 07/10/2015 1529   CHOLHDL 8.3 (H) 07/10/2015 1529   VLDL 62 (H) 07/10/2015 1529   LDLCALC 62 07/10/2015 1529      Wt Readings from Last 3 Encounters:  02/04/17 278 lb (126.1 kg)  01/23/17 284 lb (128.8 kg)  07/24/16 260 lb (117.9 kg)      Other studies Reviewed: CORONARY ANGIOGRAPHY: Cardiac Cath 09/03/2011 Vessel SegmentVessel TypeStenosis (%)Description Comment LMCANative 0 Distal LADNative 25  3rd MarginalNative 25  1st RPL Native 0 Small vessel  Ramus Present: Yes Coronary Dominance:Right Lesions/Grafts Comments: - Normal right coronary artery  CONCLUSIONS:  CORONARY STATUS: Mild non-obstructive ASCAD   LV FUNCTION:  Ejection Fraction: 50%  Wall Motion: Normal   OTHER: - mild non-obstructive CAD - low normal LV systolic function - right radial arteriotomy    ASSESSMENT AND PLAN:  1. Abnormal NM stress test: Found to have defect indicative of old MI<  with peri-infarct ischemia. Ongoing chest pain. I have discussed repeat cardiac cath with this patient. The patient understands that risks include but are not limited  to stroke (1 in 1000), death (1 in 57), kidney failure [usually temporary] (1 in 500), bleeding (1 in 200), allergic reaction [possibly serious] (1 in 200), and agrees to proceed. I have also discussed this with Dr. Johnsie Cancel who is in the office today.   The patient is in agreement to proceed with cardiac cath. I do have concerns about compliance issues, in light of previous non-compliance, no medications for over a year, and forgetfulness. I have explained to him the importance of taking medications as directed, especially if intervention is necessary requiring stent placement. He will  need to be on DAPT. Consider BMS if this is the case.  Will defer to interventionist for recommendations.   In the interim, I have reminded him to fill his Rx for medications started back on last office visit.  I will start him on metoprolol succinate 25 mg daily now for daily dosing. He will be given another Rx for lisinopril 2.5 mg daily, atorvastatin 80 mg daily, and given ASA 81 mg daily.   Pre-cath labs will also include Hgb A1C.      2.  CAD: Last cardiac cath in 2013 demonstrated 255 stenosis of the LAD, and third marginal,. Start him back on statin, ASA, ACE and BB until cath completed. More medication adjustment recommendations post procedure.    3. Ongoing Tobacco Abuse: Cessation counseling is completed once again. He verbalizes understanding.   4. Hx of Diabetes; Hgb A1C is ordered with pre-cath labs. He will need to see PCP for this management.     Current medicines are reviewed at length with the patient today.    Labs/ tests ordered today include: Pre cath labs and orders.  Phill Myron. West Pugh, ANP, AACC   02/04/2017 2:30 PM    [facility] [address], [place], [place] [address] Phone: [phone]; Fax: [phone]

## 2017-02-27 NOTE — Progress Notes (Deleted)
Cardiology Office Note   Date:  02/27/2017   ID:  Chad Hampton, DOB [birth year], MRN [id number]  PCP:  Lemmie Evens, MD  Cardiologist: Dr. Dorris Carnes  No chief complaint on file.     History of Present Illness: Chad Hampton is a 36 y.o. male who presents for ongoing assessment and management of nonobstructive CAD, hypertension, with history of syncope, seizures, ongoing tobacco abuse, and chronic chest pain.  The patient underwent an intermediate risk stress test on 01/28/2017 and was ultimately sent for a cardiac catheterization for definitive evaluation of coronary anatomy.   Conclusion 02/07/2017.    Ost LAD to Prox LAD lesion, 25 %stenosed.  Ost Cx to Prox Cx lesion, 25 %stenosed.  The left ventricular systolic function is normal.  LV end diastolic pressure is normal.  The left ventricular ejection fraction is 55-65% by visual estimate.  There is no aortic valve stenosis.   Nonobstructive coronary artery disease.    Continue with aggressive primary prevention including smoking cessation.   He is here to discuss results and evaluate his current status.   Past Medical History:  Diagnosis Date  . Acute angina ([facility])   . Asthma   . CAD (coronary artery disease)   . Diabetes mellitus without complication ([facility])   . Hypertension   . Seizure disorder ([facility])   . Seizures ([place])     Past Surgical History:  Procedure Laterality Date  . CHOLECYSTECTOMY    . EYE SURGERY    . FACIAL RECONSTRUCTION SURGERY    . LEFT HEART CATH AND CORONARY ANGIOGRAPHY N/A 02/07/2017   Procedure: LEFT HEART CATH AND CORONARY ANGIOGRAPHY;  Surgeon: Jettie Booze, MD;  Location: [facility];  Service: Cardiovascular;  Laterality: N/A;     Current Outpatient Prescriptions  Medication Sig Dispense Refill  . acetaminophen (TYLENOL) 500 MG tablet Take 500 mg by mouth every 6 (six) hours as needed for moderate pain or headache.    . albuterol (PROVENTIL HFA;VENTOLIN HFA) 108  (90 Base) MCG/ACT inhaler Inhale 2 puffs into the lungs every 6 (six) hours as needed for wheezing or shortness of breath.    Marland Kitchen aspirin 81 MG tablet Take 81 mg by mouth daily.    Marland Kitchen atorvastatin (LIPITOR) 80 MG tablet Take 1 tablet (80 mg total) by mouth daily at 6 PM. 30 tablet 3  . lisinopril (PRINIVIL,ZESTRIL) 2.5 MG tablet Take 1 tablet (2.5 mg total) by mouth daily. 30 tablet 3  . metoprolol succinate (TOPROL XL) 25 MG 24 hr tablet Take 1 tablet (25 mg total) by mouth daily. 30 tablet 3  . nitroGLYCERIN (NITROSTAT) 0.4 MG SL tablet Place 1 tablet (0.4 mg total) under the tongue every 5 (five) minutes as needed. (Patient taking differently: Place 0.4 mg under the tongue every 5 (five) minutes as needed for chest pain. ) 25 tablet 3  . phenytoin (DILANTIN) 300 MG ER capsule Take 300 mg by mouth daily.     No current facility-administered medications for this visit.     Allergies:   Morphine and related; Penicillins; Sulfa antibiotics; Tramadol; and Ibuprofen    Social History:  The patient  reports that he has been smoking Cigarettes.  He has a 20.00 pack-year smoking history. He has never used smokeless tobacco. He reports that he drinks alcohol. He reports that he uses drugs, including Marijuana.   Family History:  The patient's family history includes Diabetes in his father and mother; Heart failure in his brother and father;  Hypertension in his mother; Stroke in his father.    ROS: All other systems are reviewed and negative. Unless otherwise mentioned in H&P    PHYSICAL EXAM: VS:  There were no vitals taken for this visit. , BMI There is no height or weight on file to calculate BMI. GEN: Well nourished, well developed, in no acute distress HEENT: normal Neck: no JVD, carotid bruits, or masses Cardiac: ***RRR; no murmurs, rubs, or gallops,no edema  Respiratory:  clear to auscultation bilaterally, normal work of breathing GI: soft, nontender, nondistended, + BS MS: no deformity or  atrophy Skin: warm and dry, no rash Neuro:  Strength and sensation are intact Psych: euthymic mood, full affect   EKG:  EKG {ACTION; IS/IS [id number]} ordered today. The ekg ordered today demonstrates ***   Recent Labs: 02/04/2017: BUN 7; Creatinine, Ser 0.98; Hemoglobin 16.0; Platelets 239; Potassium 3.7; Sodium 136    Lipid Panel    Component Value Date/Time   CHOL 141 07/10/2015 1529   TRIG 310 (H) 07/10/2015 1529   HDL 17 (L) 07/10/2015 1529   CHOLHDL 8.3 (H) 07/10/2015 1529   VLDL 62 (H) 07/10/2015 1529   LDLCALC 62 07/10/2015 1529      Wt Readings from Last 3 Encounters:  02/07/17 270 lb (122.5 kg)  02/04/17 278 lb (126.1 kg)  01/23/17 284 lb (128.8 kg)      Other studies Reviewed: Additional studies/ records that were reviewed today include: ***. Review of the above records demonstrates: ***   ASSESSMENT AND PLAN:  1.  ***   Current medicines are reviewed at length with the patient today.    Labs/ tests ordered today include: *** Phill Myron. West Pugh, ANP, AACC   02/27/2017 11:42 AM    [facility] [address], [place], [place] [address] Phone: [phone]; Fax: [phone]

## 2017-03-07 ENCOUNTER — Encounter: Payer: Self-pay | Admitting: Adult Health

## 2017-03-07 ENCOUNTER — Ambulatory Visit: Payer: Self-pay | Admitting: Adult Health

## 2017-03-27 ENCOUNTER — Ambulatory Visit (INDEPENDENT_AMBULATORY_CARE_PROVIDER_SITE_OTHER): Payer: Medicare Other | Admitting: Adult Health

## 2017-03-27 ENCOUNTER — Encounter: Payer: Self-pay | Admitting: Adult Health

## 2017-03-27 VITALS — BP 132/86 | HR 93 | Ht 72.0 in | Wt 275.0 lb

## 2017-03-27 DIAGNOSIS — E7800 Pure hypercholesterolemia, unspecified: Secondary | ICD-10-CM | POA: Diagnosis not present

## 2017-03-27 DIAGNOSIS — I2510 Atherosclerotic heart disease of native coronary artery without angina pectoris: Secondary | ICD-10-CM

## 2017-03-27 DIAGNOSIS — Z9119 Patient's noncompliance with other medical treatment and regimen: Secondary | ICD-10-CM

## 2017-03-27 DIAGNOSIS — Z91199 Patient's noncompliance with other medical treatment and regimen due to unspecified reason: Secondary | ICD-10-CM

## 2017-03-27 DIAGNOSIS — I10 Essential (primary) hypertension: Secondary | ICD-10-CM | POA: Diagnosis not present

## 2017-03-27 DIAGNOSIS — Z720 Tobacco use: Secondary | ICD-10-CM | POA: Diagnosis not present

## 2017-03-27 MED ORDER — ATORVASTATIN CALCIUM 40 MG PO TABS
40.0000 mg | ORAL_TABLET | Freq: Every day | ORAL | 3 refills | Status: DC
Start: 2017-03-27 — End: 2017-06-24

## 2017-03-27 MED ORDER — LISINOPRIL 2.5 MG PO TABS: 2.5000 mg | ORAL_TABLET | Freq: Every day | ORAL | 3 refills | Status: DC

## 2017-03-27 MED ORDER — RANITIDINE HCL 150 MG PO TABS: 150.0000 mg | ORAL_TABLET | Freq: Two times a day (BID) | ORAL | 3 refills | Status: DC

## 2017-03-27 MED ORDER — METOPROLOL TARTRATE 25 MG PO TABS: 12.5000 mg | ORAL_TABLET | Freq: Two times a day (BID) | ORAL | 3 refills | Status: DC

## 2017-03-27 NOTE — Patient Instructions (Signed)
Your physician recommends that you schedule a follow-up appointment in: 3 months   Take Ranitidine  150 mg twice a day for stomach  Take Atorvastatin 40 mg at dinner for cholesterol   Take Lisinopril 2.5 mg daily for blood pressure  Take Metoprolol tartrate 12.5 mg (1/2 tablet) twice day for heart   [employer] brand of Nicotine patches is called Equate . These patches are applied to skin and stay on for 24 hours and then removed.Then place a new patch on. The cost is $16.00 for 7 patches, or $ 26.00 for 14 patches.       No tests or lab work ordered today    Thank you for choosing [facility] !

## 2017-03-27 NOTE — Progress Notes (Signed)
Cardiology Office Note   Date:  03/27/2017   ID:  Chad Hampton, DOB Sep 16, 1980, MRN [id number]  PCP:  Lemmie Evens, MD  Cardiologist: Dorris Carnes, MD Chief Complaint  Patient presents with  . Coronary Artery Disease  . Hypertension  . Chest Pain     History of Present Illness: Chad Hampton is a 36 y.o. male who presents for ongoing assessment and management of coronary artery disease, hypertension, history of seizure disorder, ongoing tobacco abuse, and chronic chest pain.  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  With ongoing chest pain, he was advised to proceed with cardiac catheterization by Dr. Harrington Challenger.  Cardiac catheterization was completed on 02/07/2017 Conclusion     Ost LAD to Prox LAD lesion, 25 %stenosed.  Ost Cx to Prox Cx lesion, 25 %stenosed.  The left ventricular systolic function is normal.  LV end diastolic pressure is normal.  The left ventricular ejection fraction is 55-65% by visual estimate.  There is no aortic valve stenosis.   Nonobstructive coronary artery disease.     Recommendations for ongoing medical management and smoking cessation were made.  He comes today having not taken any further medications as he is unable to afford them.  He has lost his disability and Medicaid.  He complains of recurrent chest pain.  He had been on a PPI in the past, and an H2 blocker.  But again has not been taking any medications.  He is also requesting NicoDerm patches.  Past Medical History:  Diagnosis Date  . Acute angina ([place])   . Asthma   . CAD (coronary artery disease)   . Diabetes mellitus without complication ([place])   . Hypertension   . Seizure disorder ([place])   . Seizures ([place])     Past Surgical History:  Procedure Laterality Date  . CHOLECYSTECTOMY    . EYE SURGERY    . FACIAL RECONSTRUCTION SURGERY    . LEFT HEART CATH AND CORONARY ANGIOGRAPHY N/A 02/07/2017   Procedure: LEFT HEART CATH AND CORONARY ANGIOGRAPHY;  Surgeon: Jettie Booze, MD;  Location: [facility];  Service: Cardiovascular;  Laterality: N/A;     Current Outpatient Medications  Medication Sig Dispense Refill  . acetaminophen (TYLENOL) 500 MG tablet Take 500 mg by mouth every 6 (six) hours as needed for moderate pain or headache.    . albuterol (PROVENTIL HFA;VENTOLIN HFA) 108 (90 Base) MCG/ACT inhaler Inhale 2 puffs into the lungs every 6 (six) hours as needed for wheezing or shortness of breath.    Marland Kitchen aspirin 81 MG tablet Take 81 mg by mouth daily.    Marland Kitchen atorvastatin (LIPITOR) 80 MG tablet Take 1 tablet (80 mg total) by mouth daily at 6 PM. 30 tablet 3  . lisinopril (PRINIVIL,ZESTRIL) 2.5 MG tablet Take 1 tablet (2.5 mg total) by mouth daily. 30 tablet 3  . metoprolol succinate (TOPROL XL) 25 MG 24 hr tablet Take 1 tablet (25 mg total) by mouth daily. 30 tablet 3  . nitroGLYCERIN (NITROSTAT) 0.4 MG SL tablet Place 1 tablet (0.4 mg total) under the tongue every 5 (five) minutes as needed. (Patient taking differently: Place 0.4 mg under the tongue every 5 (five) minutes as needed for chest pain. ) 25 tablet 3  . phenytoin (DILANTIN) 300 MG ER capsule Take 300 mg by mouth daily.     No current facility-administered medications for this visit.     Allergies:  Morphine and related; Penicillins; Sulfa antibiotics; Tramadol; and Ibuprofen    Social History:  The patient  reports that he has been smoking cigarettes.  He has a 30.00 pack-year smoking history. he has never used smokeless tobacco. He reports that he drinks alcohol. He reports that he uses drugs. Drug: Marijuana.   Family History:  The patient's family history includes Diabetes in his father and mother; Heart failure in his brother and father; Hypertension in his mother; Stroke in his father.    ROS: All other systems are reviewed and negative. Unless otherwise mentioned in H&P    PHYSICAL EXAM: VS:  BP  132/86   Pulse 93   Ht 6' (1.829 m)   Wt 275 lb (124.7 kg)   SpO2 98%   BMI 37.30 kg/m  , BMI Body mass index is 37.3 kg/m. GEN: Well nourished, well developed, in no acute distress  HEENT: normal  Neck: no JVD, carotid bruits, or masses Cardiac: RRR; no murmurs, rubs, or gallops,no edema  Respiratory:  clear to auscultation bilaterally, normal work of breathing occasional expiratory wheezing, cleared with cough GI: soft, nontender, nondistended, + BS MS: no deformity or atrophy  Skin: warm and dry, no rash Neuro:  Strength and sensation are intact Psych: euthymic mood, full affect   Recent Labs: 02/04/2017: BUN 7; Creatinine, Ser 0.98; Hemoglobin 16.0; Platelets 239; Potassium 3.7; Sodium 136    Lipid Panel    Component Value Date/Time   CHOL 141 07/10/2015 1529   TRIG 310 (H) 07/10/2015 1529   HDL 17 (L) 07/10/2015 1529   CHOLHDL 8.3 (H) 07/10/2015 1529   VLDL 62 (H) 07/10/2015 1529   LDLCALC 62 07/10/2015 1529      Wt Readings from Last 3 Encounters:  03/27/17 275 lb (124.7 kg)  02/07/17 270 lb (122.5 kg)  02/04/17 278 lb (126.1 kg)     ASSESSMENT AND PLAN:  1.  Chest pain: Does not appear related to ischemia.  Cardiac catheterization revealed nonobstructive CAD.  He will continue secondary prevention.  He is not been taking medications due to cost.  We are sending his prescriptions to [facility], as medications are on their $4 plan.   Medications for [facility]: Atorvastatin 40 mg daily; lisinopril 2.5 mg daily; metoprolol tartrate 12.5 mg twice daily; ranitidine 150 mg twice daily.  Over-the-counter NicoDerm patch [facility] brand 21 mg.  2.  Hypertension: Blood pressure is essentially normal with evidence of diastolic elevation.  Medications as above.  3.  Hypercholesterolemia: Providing Rx for atorvastatin 40 mg daily.  [facility] but does not have 80 mg tablet $4 plan.  Will provide some coverage at lower dose.  But he will need follow-up labs to assess his response to  medication.  4.  Ongoing tobacco abuse: Patient would like to quit but is having trouble and is requesting NicoDerm patch.  This is not covered on one more plan.  There is an equate brand that is less expensive. This is suggested to him.    Current medicines are reviewed at length with the patient today.    Labs/ tests ordered today include:   Phill Myron. [place] Pugh, ANP, AACC   03/27/2017 3:32 PM    [facility] [address], [place], [place] [address] Phone: [phone]; Fax: [phone]

## 2017-04-30 ENCOUNTER — Telehealth: Payer: Self-pay | Admitting: Student

## 2017-04-30 NOTE — Telephone Encounter (Signed)
Returned pt call. He states he is needing the permission from cardiologist to have dental procedures done. I sent Dr. Diona Browner fax requesting information about procedure for cardiologist to review. Let pt know I will be working on this for him.

## 2017-04-30 NOTE — Telephone Encounter (Signed)
Please give pt a call--he's needing to have dental work done [phone]

## 2017-05-01 NOTE — Telephone Encounter (Signed)
Received clearance from dental office. Put in [place] PA folder to review.

## 2017-05-05 NOTE — Telephone Encounter (Signed)
Faxed clearance to dental office 05/05/2017.

## 2017-06-04 NOTE — H&P (Signed)
HISTORY AND PHYSICAL  Chad Hampton is a 37 y.o. male patient with CC: Dental pain. Referred by general dentist for multiple extractions.   No diagnosis found.  Past Medical History:  Diagnosis Date  . Acute angina ([place])   . Asthma   . CAD (coronary artery disease)   . Diabetes mellitus without complication ([place])   . Hypertension   . Seizure disorder ([place])   . Seizures ([place])     No current facility-administered medications for this encounter.    Current Outpatient Medications  Medication Sig Dispense Refill  . albuterol (PROVENTIL HFA;VENTOLIN HFA) 108 (90 Base) MCG/ACT inhaler Inhale 2 puffs into the lungs every 6 (six) hours as needed for wheezing or shortness of breath.    Marland Kitchen atorvastatin (LIPITOR) 40 MG tablet Take 1 tablet (40 mg total) by mouth daily. (Patient not taking: Reported on 05/27/2017) 30 tablet 3  . lisinopril (PRINIVIL,ZESTRIL) 2.5 MG tablet Take 1 tablet (2.5 mg total) by mouth daily. (Patient not taking: Reported on 05/27/2017) 30 tablet 3  . metoprolol tartrate (LOPRESSOR) 25 MG tablet Take 0.5 tablets (12.5 mg total) by mouth 2 (two) times daily. (Patient not taking: Reported on 05/27/2017) 30 tablet 3  . nitroGLYCERIN (NITROSTAT) 0.4 MG SL tablet Place 1 tablet (0.4 mg total) under the tongue every 5 (five) minutes as needed. (Patient not taking: Reported on 05/27/2017) 25 tablet 3  . ranitidine (ZANTAC) 150 MG tablet Take 1 tablet (150 mg total) by mouth 2 (two) times daily. (Patient not taking: Reported on 05/27/2017) 60 tablet 3   Allergies  Allergen Reactions  . Morphine And Related Shortness Of Breath  . Penicillins Other (See Comments)    Pt states high severity reaction but unknown since childhood Has patient had a PCN reaction causing immediate rash, facial/tongue/throat swelling, SOB or lightheadedness with hypotension: Unknown Has patient had a PCN reaction causing severe rash involving mucus membranes or skin necrosis: No Has patient had a PCN reaction  that required hospitalization: Yes Has patient had a PCN reaction occurring within the last 10 years: No If all of the above answers are "NO", then may proceed with Cephalosporin  . Sulfa Antibiotics Other (See Comments)    "bothers my breathing"  . Tramadol Other (See Comments)    Seizures   . Ibuprofen Rash   Active Problems:   * No active hospital problems. *  Vitals: There were no vitals taken for this visit. Lab results:No results found for this or any previous visit (from the past 46 hour(s)). Radiology Results: No results found. General appearance: alert, cooperative, no distress and morbidly obese Head: Normocephalic, without obvious abnormality, atraumatic Eyes: negative Nose: Nares normal. Septum midline. Mucosa normal. No drainage or sinus tenderness. Throat: lips, mucosa, and tongue normal; teeth and gums normal and Large dental caries teeth # 1, 17, 18, 19, periodontal disease # 23, 24, 25, 26. 3+ tonsils no exudat. No trismus Neck: no adenopathy, supple, symmetrical, trachea midline and thyroid not enlarged, symmetric, no tenderness/mass/nodules Resp: clear to auscultation bilaterally Cardio: regular rate and rhythm, S1, S2 normal, no murmur, click, rub or gallop  Assessment: Multiple nonrestorable teeth  Plan: Mujltiple dental extractions. GA. Nasal. Day surgery.   Diona Browner 06/04/2017

## 2017-06-05 ENCOUNTER — Encounter (HOSPITAL_COMMUNITY): Payer: Self-pay | Admitting: *Deleted

## 2017-06-05 ENCOUNTER — Other Ambulatory Visit: Payer: Self-pay

## 2017-06-05 NOTE — Progress Notes (Signed)
Anesthesia chart review: Same day workup.  Patient is a 37 year old male scheduled for multiple teeth extraction with alveoloplasty on 06/06/2017 by Dr. Diona Browner.  History includes smoking, CAD/chest pain (non-obstructive CAD 01/2017), diabetes mellitus type 2, seizure disorder, hypertension, asthma, cholecystectomy, facial reconstruction surgery. Medication non-compliance.  PCP is Dr. Lemmie Evens. Cardiologist is Dr. Dorris Carnes. Last visit 03/27/17 with Jory Sims, NP.  Chest pain not felt related to ischemia as cardiac cath revealed nonobstructive CAD.  He reported not taking medications due to cost.  She did send prescriptions to the [facility] $4 plan.  Meds include albuterol.  He is not currently taking Lipitor, lisinopril, Lopressor, nitro, Zantac. Notes say he has been on Dilantin in the past for seizures, but this is not currently listed. EF visit 09/17/15 for seizure noted.  EKG 02/07/17: NSR  Cardiac cath 02/07/17:  Ost LAD to Prox LAD lesion, 25 %stenosed.  Ost Cx to Prox Cx lesion, 25 %stenosed.  The left ventricular systolic function is normal.  LV end diastolic pressure is normal.  The left ventricular ejection fraction is 55-65% by visual estimate.  There is no aortic valve stenosis. Nonobstructive coronary artery disease.   Continue with aggressive primary prevention including smoking cessation.  Nuclear stress test 01/28/17:  There was no ST segment deviation noted during stress.  Defect 1: There is a defect of moderate severity present in the basal anteroseptal, basal inferoseptal, mid anteroseptal, mid inferoseptal, apical anterior, apical septal and apical inferior location.  Findings consistent with prior myocardial infarction with peri-infarct ischemia.  This is an intermediate risk study.  Nuclear stress EF: 47%.  Cardiac cath recommended (done 02/07/17 and showed non-obstructive CAD).  Echo 01/28/17: Study Conclusions - Left ventricle: The  cavity size was normal. Systolic function was   normal. The estimated ejection fraction was in the range of 60%   to 65%. Wall motion was normal; there were no regional wall   motion abnormalities. Left ventricular diastolic function   parameters were normal. - Aortic valve: There was mild regurgitation.  CTA neck 07/26/16: IMPRESSION: - CTA confirms eccentric thickening of the right internal carotid artery beginning approximately 2 cm above the bifurcation extending over 3 cm length. No evidence of atherosclerotic calcification. No hemodynamically significant stenosis. This could represent a dissection which is thrombosed. This could also represent inflammation of the carotid artery (carotidynia). - Remainder of the arteries in the neck normal - Prominent cervical lymph nodes bilaterally which may be reactive. Bilateral tonsillar hypertrophy. (Per 06/3016 note by Dr. Nat Christen: "Reviewed CT angiogram of neck with vascular surgeon on-call.  He did not feel that this report was indicative of any life-threatening condition. I discussed the case with the patient on [phone]."  CXR 02/04/17: IMPRESSION: No active cardiopulmonary disease.  He is for labs on arrival. (A1c 5.9 02/04/17)  Patient is a same-day workup.  He had cardiac cath less than 6 months ago which showed nonobstructive CAD.  He is noncompliant with medications. BP was 132/86 at 03/27/17 cardiology visit. Dicussed with anesthesiologist Dr. Renold Don. If labs, vitals, symptoms stable then it is anticipated that he can proceed.  Anesthesiologist to evaluate on the day of surgery.  George Hugh [facility] Short Stay Center/Anesthesiology Phone [phone] 06/05/2017 1:29 PM

## 2017-06-05 NOTE — Progress Notes (Signed)
Pt denies any acute cardiopulmonary issues. Pt under the care of  Jory Sims, NP, Cardiology. Pt denies recent labs. Pt made aware to stop taking vitamins, fish oil and herbal medications. Do not take any NSAIDs ie: Ibuprofen, Advil, Naproxen (Aleve), motrin, BC and Goody Powder. Pt made aware to check BG every 2 hours prior to arrival to hospital on DOS. Pt made aware to treat a BG < 70 with 4 ounces of apple  juice, wait 15 minutes after intervention to recheck BG, if BG remains < 70, call Short Stay unit to speak with a nurse. Pt verbalized understanding of all pre-op instructions. Anesthesia asked to review pt history (see note).

## 2017-06-06 ENCOUNTER — Encounter (HOSPITAL_COMMUNITY)
Admission: RE | Disposition: A | Discharge status: Home / Self Care | Payer: Self-pay | Source: Ambulatory Visit | Attending: Oral Surgery

## 2017-06-06 ENCOUNTER — Encounter (HOSPITAL_COMMUNITY): Payer: Self-pay | Admitting: *Deleted

## 2017-06-06 ENCOUNTER — Ambulatory Visit (HOSPITAL_COMMUNITY)
Admission: RE | Admit: 2017-06-06 | Discharge: 2017-06-06 | Disposition: A | Discharge status: Home / Self Care | Payer: Medicaid Other | Source: Ambulatory Visit | Attending: Oral Surgery | Admitting: Oral Surgery

## 2017-06-06 ENCOUNTER — Ambulatory Visit (HOSPITAL_COMMUNITY): Payer: Medicaid Other | Admitting: Vascular Surgery

## 2017-06-06 DIAGNOSIS — G40909 Epilepsy, unspecified, not intractable, without status epilepticus: Secondary | ICD-10-CM | POA: Insufficient documentation

## 2017-06-06 DIAGNOSIS — J45909 Unspecified asthma, uncomplicated: Secondary | ICD-10-CM | POA: Insufficient documentation

## 2017-06-06 DIAGNOSIS — I2510 Atherosclerotic heart disease of native coronary artery without angina pectoris: Secondary | ICD-10-CM | POA: Diagnosis not present

## 2017-06-06 DIAGNOSIS — F430 Acute stress reaction: Secondary | ICD-10-CM | POA: Insufficient documentation

## 2017-06-06 DIAGNOSIS — K029 Dental caries, unspecified: Secondary | ICD-10-CM | POA: Diagnosis not present

## 2017-06-06 DIAGNOSIS — I10 Essential (primary) hypertension: Secondary | ICD-10-CM | POA: Insufficient documentation

## 2017-06-06 DIAGNOSIS — F17200 Nicotine dependence, unspecified, uncomplicated: Secondary | ICD-10-CM | POA: Insufficient documentation

## 2017-06-06 DIAGNOSIS — Z880 Allergy status to penicillin: Secondary | ICD-10-CM | POA: Diagnosis not present

## 2017-06-06 DIAGNOSIS — Z79899 Other long term (current) drug therapy: Secondary | ICD-10-CM | POA: Diagnosis not present

## 2017-06-06 DIAGNOSIS — Z885 Allergy status to narcotic agent status: Secondary | ICD-10-CM | POA: Insufficient documentation

## 2017-06-06 DIAGNOSIS — F329 Major depressive disorder, single episode, unspecified: Secondary | ICD-10-CM | POA: Diagnosis not present

## 2017-06-06 DIAGNOSIS — Z882 Allergy status to sulfonamides status: Secondary | ICD-10-CM | POA: Diagnosis not present

## 2017-06-06 DIAGNOSIS — E119 Type 2 diabetes mellitus without complications: Secondary | ICD-10-CM | POA: Diagnosis not present

## 2017-06-06 DIAGNOSIS — K219 Gastro-esophageal reflux disease without esophagitis: Secondary | ICD-10-CM | POA: Insufficient documentation

## 2017-06-06 HISTORY — DX: Acute myocardial infarction, unspecified: I21.9

## 2017-06-06 HISTORY — PX: MULTIPLE EXTRACTIONS WITH ALVEOLOPLASTY: SHX5342

## 2017-06-06 HISTORY — DX: Gastro-esophageal reflux disease without esophagitis: K21.9

## 2017-06-06 HISTORY — DX: Headache: R51

## 2017-06-06 HISTORY — DX: Dental caries, unspecified: K02.9

## 2017-06-06 HISTORY — DX: Headache, unspecified: R51.9

## 2017-06-06 HISTORY — DX: Unspecified osteoarthritis, unspecified site: M19.90

## 2017-06-06 LAB — GLUCOSE, CAPILLARY
GLUCOSE-CAPILLARY: 110 mg/dL — AB (ref 65–99)
GLUCOSE-CAPILLARY: 108 mg/dL — AB (ref 65–99)
Glucose-Capillary: 95 mg/dL (ref 65–99)

## 2017-06-06 LAB — HEMOGLOBIN A1C
HEMOGLOBIN A1C: 6 % — AB (ref 4.8–5.6)
MEAN PLASMA GLUCOSE: 125.5 mg/dL

## 2017-06-06 LAB — BASIC METABOLIC PANEL
ANION GAP: 13 (ref 5–15)
BUN: 8 mg/dL (ref 6–20)
CALCIUM: 8.9 mg/dL (ref 8.9–10.3)
CO2: 20 mmol/L — ABNORMAL LOW (ref 22–32)
CREATININE: 0.9 mg/dL (ref 0.61–1.24)
Chloride: 104 mmol/L (ref 101–111)
GFR calc Af Amer: >60 mL/min (ref >60)
Glucose, Bld: 104 mg/dL — ABNORMAL HIGH (ref 65–99)
Potassium: 3.7 mmol/L (ref 3.5–5.1)
Sodium: 137 mmol/L (ref 135–145)

## 2017-06-06 LAB — CBC
HCT: 45.5 % (ref 39.0–52.0)
HEMOGLOBIN: 15.5 g/dL (ref 13.0–17.0)
MCH: 31.2 pg (ref 26.0–34.0)
MCHC: 34.1 g/dL (ref 30.0–36.0)
MCV: 91.5 fL (ref 78.0–100.0)
PLATELETS: 251 10*3/uL (ref 150–400)
RBC: 4.97 MIL/uL (ref 4.22–5.81)
RDW: 13.8 % (ref 11.5–15.5)
WBC: 12 10*3/uL — ABNORMAL HIGH (ref 4.0–10.5)

## 2017-06-06 SURGERY — MULTIPLE EXTRACTION WITH ALVEOLOPLASTY
Anesthesia: General | Site: Mouth

## 2017-06-06 MED ORDER — DEXAMETHASONE SODIUM PHOSPHATE 10 MG/ML IJ SOLN
INTRAMUSCULAR | Status: AC
  Filled 2017-06-06: qty 1

## 2017-06-06 MED ORDER — HYDROMORPHONE HCL 1 MG/ML IJ SOLN: 0.2500 mg | INTRAMUSCULAR | Status: DC | PRN

## 2017-06-06 MED ORDER — ONDANSETRON HCL 4 MG/2ML IJ SOLN
INTRAMUSCULAR | Status: DC | PRN
Start: 2017-06-06 — End: 2017-06-06
  Administered 2017-06-06: 4 mg via INTRAVENOUS

## 2017-06-06 MED ORDER — ALBUTEROL SULFATE HFA 108 (90 BASE) MCG/ACT IN AERS
INHALATION_SPRAY | RESPIRATORY_TRACT | Status: AC
  Filled 2017-06-06: qty 6.7

## 2017-06-06 MED ORDER — OXYMETAZOLINE HCL 0.05 % NA SOLN
NASAL | Status: AC
  Filled 2017-06-06: qty 15

## 2017-06-06 MED ORDER — SUCCINYLCHOLINE CHLORIDE 20 MG/ML IJ SOLN
INTRAMUSCULAR | Status: DC | PRN
  Administered 2017-06-06: 120 mg via INTRAVENOUS

## 2017-06-06 MED ORDER — PROPOFOL 10 MG/ML IV BOLUS
INTRAVENOUS | Status: AC
  Filled 2017-06-06: qty 20

## 2017-06-06 MED ORDER — LIDOCAINE-EPINEPHRINE 2 %-1:100000 IJ SOLN
INTRAMUSCULAR | Status: AC
  Filled 2017-06-06: qty 1

## 2017-06-06 MED ORDER — ALBUTEROL SULFATE HFA 108 (90 BASE) MCG/ACT IN AERS
INHALATION_SPRAY | RESPIRATORY_TRACT | Status: DC | PRN
  Administered 2017-06-06: 2 via RESPIRATORY_TRACT

## 2017-06-06 MED ORDER — MIDAZOLAM HCL 2 MG/2ML IJ SOLN
INTRAMUSCULAR | Status: DC | PRN
  Administered 2017-06-06 (×2): 1 mg via INTRAVENOUS

## 2017-06-06 MED ORDER — PROMETHAZINE HCL 25 MG/ML IJ SOLN
INTRAMUSCULAR | Status: AC
  Administered 2017-06-06: 6.25 mg via INTRAVENOUS
  Filled 2017-06-06: qty 1

## 2017-06-06 MED ORDER — PROPOFOL 10 MG/ML IV BOLUS
INTRAVENOUS | Status: DC | PRN
  Administered 2017-06-06: 25 mg via INTRAVENOUS
  Administered 2017-06-06: 175 mg via INTRAVENOUS

## 2017-06-06 MED ORDER — LIDOCAINE-EPINEPHRINE 2 %-1:100000 IJ SOLN
INTRAMUSCULAR | Status: DC | PRN
  Administered 2017-06-06: 14 mL

## 2017-06-06 MED ORDER — GLYCOPYRROLATE 0.2 MG/ML IJ SOLN
INTRAMUSCULAR | Status: DC | PRN
  Administered 2017-06-06: 0.2 mg via INTRAVENOUS

## 2017-06-06 MED ORDER — LACTATED RINGERS IV SOLN
INTRAVENOUS | Status: DC
  Administered 2017-06-06 (×2): via INTRAVENOUS

## 2017-06-06 MED ORDER — MIDAZOLAM HCL 2 MG/2ML IJ SOLN
INTRAMUSCULAR | Status: AC
  Filled 2017-06-06: qty 2

## 2017-06-06 MED ORDER — FENTANYL CITRATE (PF) 250 MCG/5ML IJ SOLN
INTRAMUSCULAR | Status: DC | PRN
  Administered 2017-06-06: 100 ug via INTRAVENOUS
  Administered 2017-06-06 (×3): 50 ug via INTRAVENOUS

## 2017-06-06 MED ORDER — LIDOCAINE HCL (CARDIAC) 20 MG/ML IV SOLN
INTRAVENOUS | Status: DC | PRN
  Administered 2017-06-06: 100 mg via INTRATRACHEAL

## 2017-06-06 MED ORDER — MEPERIDINE HCL 50 MG/ML IJ SOLN: 6.2500 mg | INTRAMUSCULAR | Status: DC | PRN

## 2017-06-06 MED ORDER — SODIUM CHLORIDE 0.9 % IR SOLN
Status: DC | PRN
  Administered 2017-06-06: 1000 mL

## 2017-06-06 MED ORDER — LIDOCAINE 2% (20 MG/ML) 5 ML SYRINGE
INTRAMUSCULAR | Status: AC
  Filled 2017-06-06: qty 5

## 2017-06-06 MED ORDER — FENTANYL CITRATE (PF) 250 MCG/5ML IJ SOLN
INTRAMUSCULAR | Status: AC
  Filled 2017-06-06: qty 5

## 2017-06-06 MED ORDER — SUCCINYLCHOLINE CHLORIDE 200 MG/10ML IV SOSY
PREFILLED_SYRINGE | INTRAVENOUS | Status: AC
  Filled 2017-06-06: qty 10

## 2017-06-06 MED ORDER — DEXAMETHASONE SODIUM PHOSPHATE 10 MG/ML IJ SOLN
INTRAMUSCULAR | Status: DC | PRN
  Administered 2017-06-06: 5 mg via INTRAVENOUS

## 2017-06-06 MED ORDER — 0.9 % SODIUM CHLORIDE (POUR BTL) OPTIME
TOPICAL | Status: DC | PRN
Start: 2017-06-06 — End: 2017-06-06
  Administered 2017-06-06: 1000 mL

## 2017-06-06 MED ORDER — CLINDAMYCIN PHOSPHATE 900 MG/50ML IV SOLN
900.0000 mg | INTRAVENOUS | Status: AC
  Administered 2017-06-06: 900 mg via INTRAVENOUS
  Filled 2017-06-06: qty 50

## 2017-06-06 MED ORDER — OXYMETAZOLINE HCL 0.05 % NA SOLN
NASAL | Status: DC | PRN
  Administered 2017-06-06: 3 via NASAL

## 2017-06-06 MED ORDER — PROMETHAZINE HCL 25 MG/ML IJ SOLN
6.2500 mg | INTRAMUSCULAR | Status: DC | PRN
  Administered 2017-06-06: 6.25 mg via INTRAVENOUS

## 2017-06-06 MED ORDER — OXYCODONE-ACETAMINOPHEN 5-325 MG PO TABS: 1.0000 | ORAL_TABLET | ORAL | 0 refills | Status: DC | PRN

## 2017-06-06 MED ORDER — OXYCODONE HCL 5 MG/5ML PO SOLN: 5.0000 mg | Freq: Once | ORAL | Status: DC | PRN

## 2017-06-06 MED ORDER — METOPROLOL TARTRATE 5 MG/5ML IV SOLN
INTRAVENOUS | Status: DC | PRN
  Administered 2017-06-06: 2 mg via INTRAVENOUS
  Administered 2017-06-06: 1 mg via INTRAVENOUS

## 2017-06-06 MED ORDER — OXYCODONE HCL 5 MG PO TABS: 5.0000 mg | ORAL_TABLET | Freq: Once | ORAL | Status: DC | PRN

## 2017-06-06 MED ORDER — ONDANSETRON HCL 4 MG/2ML IJ SOLN
INTRAMUSCULAR | Status: AC
  Filled 2017-06-06: qty 2

## 2017-06-06 SURGICAL SUPPLY — 32 items
BUR CROSS CUT FISSURE 1.6 (BURR) ×2 IMPLANT
BUR CROSS CUT FISSURE 1.6MM (BURR) ×1
BUR EGG ELITE 4.0 (BURR) IMPLANT
BUR EGG ELITE 4.0MM (BURR)
CANISTER SUCT 3000ML PPV (MISCELLANEOUS) ×3 IMPLANT
COVER SURGICAL LIGHT HANDLE (MISCELLANEOUS) ×3 IMPLANT
CRADLE DONUT ADULT HEAD (MISCELLANEOUS) ×3 IMPLANT
DECANTER SPIKE VIAL GLASS SM (MISCELLANEOUS) ×3 IMPLANT
DRAPE U-SHAPE 76X120 STRL (DRAPES) ×3 IMPLANT
FLUID NSS /IRRIG 1000 ML XXX (MISCELLANEOUS) ×3 IMPLANT
GAUZE PACKING FOLDED 2  STR (GAUZE/BANDAGES/DRESSINGS) ×2
GAUZE PACKING FOLDED 2 STR (GAUZE/BANDAGES/DRESSINGS) ×1 IMPLANT
GLOVE BIO SURGEON STRL SZ 6.5 (GLOVE) ×2 IMPLANT
GLOVE BIO SURGEON STRL SZ7.5 (GLOVE) ×3 IMPLANT
GLOVE BIO SURGEONS STRL SZ 6.5 (GLOVE) ×1
GLOVE BIOGEL PI IND STRL 7.0 (GLOVE) IMPLANT
GLOVE BIOGEL PI INDICATOR 7.0 (GLOVE)
GOWN STRL REUS W/ TWL LRG LVL3 (GOWN DISPOSABLE) ×1 IMPLANT
GOWN STRL REUS W/ TWL XL LVL3 (GOWN DISPOSABLE) ×1 IMPLANT
GOWN STRL REUS W/TWL LRG LVL3 (GOWN DISPOSABLE) ×2
GOWN STRL REUS W/TWL XL LVL3 (GOWN DISPOSABLE) ×2
KIT BASIN OR (CUSTOM PROCEDURE TRAY) ×3 IMPLANT
KIT ROOM TURNOVER OR (KITS) ×3 IMPLANT
NEEDLE 22X1 1/2 (OR ONLY) (NEEDLE) ×6 IMPLANT
NEEDLE 27GAX1X1/2 (NEEDLE) IMPLANT
NS IRRIG 1000ML POUR BTL (IV SOLUTION) ×3 IMPLANT
PAD ARMBOARD 7.5X6 YLW CONV (MISCELLANEOUS) ×3 IMPLANT
SUT CHROMIC 3 0 PS 2 (SUTURE) ×6 IMPLANT
SYR CONTROL 10ML LL (SYRINGE) ×6 IMPLANT
TRAY ENT MC OR (CUSTOM PROCEDURE TRAY) ×3 IMPLANT
TUBING IRRIGATION (MISCELLANEOUS) ×3 IMPLANT
YANKAUER SUCT BULB TIP NO VENT (SUCTIONS) ×3 IMPLANT

## 2017-06-06 NOTE — Op Note (Signed)
06/06/2017  9:43 AM  PATIENT:  Chad Hampton  37 y.o. male  PRE-OPERATIVE DIAGNOSIS:  NON RESTORABLE TEETH NUMBERS ONE, SEVENTEEN, EIGHTEEN, NINETEEN, TWENTY-THREE, TWENTY-FOUR, TWENTY-FIVE, TWENTY-SIX.  POST-OPERATIVE DIAGNOSIS:  SAME  PROCEDURE:  Procedure(s): MULTIPLE EXTRACTIONS OF TEETH NUMBERS ONE, SEVENTEEN, EIGHTEEN, NINETEEN, TWENTY-THREE, TWENTY-FOUR, TWENTY-FIVE, TWENTY-SIX.  SURGEON:  Surgeon(s): Diona Browner, DDS  ANESTHESIA:   local and general  EBL:  minimal  DRAINS: none   SPECIMEN:  No Specimen  COUNTS:  YES  PLAN OF CARE: Discharge to home after PACU  PATIENT DISPOSITION:  PACU - hemodynamically stable.   PROCEDURE DETAILS: Dictation # [id number]  Gae Bon, DMD 06/06/2017 9:43 AM

## 2017-06-06 NOTE — Transfer of Care (Signed)
Immediate Anesthesia Transfer of Care Note  Patient: Chad Hampton  Procedure(s) Performed: MULTIPLE EXTRACTIONS OF TEETH NUMBERS ONE, SEVENTEEN, EIGHTEEN, NINETEEN, TWENTY-THREE, TWENTY-FOUR, TWENTY-FIVE, TWENTY-SIX. (N/A Mouth)  Patient Location: PACU  Anesthesia Type:General  Level of Consciousness: awake, alert  and patient cooperative  Airway & Oxygen Therapy: Patient Spontanous Breathing and Patient connected to face mask oxygen  Post-op Assessment: Report given to RN, Post -op Vital signs reviewed and stable, Patient moving all extremities X 4 and Patient able to stick tongue midline  Post vital signs: Reviewed and stable  Last Vitals:  Vitals:   06/06/17 0626 06/06/17 0957  BP: (!) 149/93 (!) 162/105  Pulse: 83 92  Resp: 20 12  Temp: 36.8 C 36.8 C  SpO2: 98% 98%    Last Pain:  Vitals:   06/06/17 0626  TempSrc: Oral      Patients Stated Pain Goal: 5 (89/21/19 4174)  Complications: No apparent anesthesia complications

## 2017-06-06 NOTE — Anesthesia Procedure Notes (Signed)
Procedure Name: Intubation Date/Time: 06/06/2017 9:15 AM Performed by: Lynda Rainwater, MD Pre-anesthesia Checklist: Patient identified, Emergency Drugs available, Suction available and Patient being monitored Patient Re-evaluated:Patient Re-evaluated prior to induction Oxygen Delivery Method: Circle system utilized Preoxygenation: Pre-oxygenation with 100% oxygen Induction Type: IV induction Ventilation: Mask ventilation with difficulty and Nasal airway inserted- appropriate to patient size Laryngoscope Size: Mac and 3 Nasal Tubes: Magill forceps- large, utilized and Right Tube size: 6.5 mm Number of attempts: 1 Placement Confirmation: ETT inserted through vocal cords under direct vision,  positive ETCO2 and breath sounds checked- equal and bilateral Secured at: 27 cm Tube secured with: Tape Dental Injury: Teeth and Oropharynx as per pre-operative assessment and Bloody posterior oropharynx

## 2017-06-06 NOTE — H&P (Signed)
H&P documentation  -History and Physical Reviewed  -Patient has been re-examined  -No change in the plan of care  Chad Hampton  

## 2017-06-06 NOTE — Progress Notes (Signed)
Pt. Non compliant with medications. Discussed with pt. Importance of taking medications. Pt. States last time he took meds was back in Nov.2018

## 2017-06-06 NOTE — Anesthesia Preprocedure Evaluation (Signed)
Anesthesia Evaluation  Patient identified by MRN, date of birth, ID band Patient awake    Reviewed: Allergy & Precautions, NPO status , Patient's Chart, lab work & pertinent test results, reviewed documented beta blocker date and time   Airway Mallampati: II  TM Distance: >3 FB Neck ROM: Full    Dental no notable dental hx.    Pulmonary neg pulmonary ROS, Current Smoker,    Pulmonary exam normal breath sounds clear to auscultation       Cardiovascular hypertension, Pt. on medications and Pt. on home beta blockers negative cardio ROS Normal cardiovascular exam Rhythm:Regular Rate:Normal     Neuro/Psych Seizures -,  Depression negative neurological ROS  negative psych ROS   GI/Hepatic negative GI ROS, Neg liver ROS, GERD  ,  Endo/Other  negative endocrine ROSdiabetes, Type 2  Renal/GU negative Renal ROS  negative genitourinary   Musculoskeletal negative musculoskeletal ROS (+)   Abdominal   Peds negative pediatric ROS (+)  Hematology negative hematology ROS (+)   Anesthesia Other Findings   Reproductive/Obstetrics negative OB ROS                             Anesthesia Physical Anesthesia Plan  ASA: III  Anesthesia Plan: General   Post-op Pain Management:    Induction: Intravenous  PONV Risk Score and Plan: 1 and Ondansetron  Airway Management Planned: Nasal ETT  Additional Equipment:   Intra-op Plan:   Post-operative Plan: Extubation in OR  Informed Consent: I have reviewed the patients History and Physical, chart, labs and discussed the procedure including the risks, benefits and alternatives for the proposed anesthesia with the patient or authorized representative who has indicated his/her understanding and acceptance.   Dental advisory given  Plan Discussed with: CRNA  Anesthesia Plan Comments:         Anesthesia Quick Evaluation

## 2017-06-06 NOTE — Progress Notes (Signed)
   06/06/17 0714  OBSTRUCTIVE SLEEP APNEA  Have you ever been diagnosed with sleep apnea through a sleep study? No  Do you snore loudly (loud enough to be heard through closed doors)?  1  Do you often feel tired, fatigued, or sleepy during the daytime (such as falling asleep during driving or talking to someone)? 0  Has anyone observed you stop breathing during your sleep? 0  Do you have, or are you being treated for high blood pressure? 1  BMI more than 35 kg/m2? 1  Age > 50 (1-yes) 0  Neck circumference greater than:Male 16 inches or larger, Male 17inches or larger? 1  Male Gender (Yes=1) 1  Obstructive Sleep Apnea Score 5  Score 5 or greater  Results sent to PCP

## 2017-06-06 NOTE — Anesthesia Postprocedure Evaluation (Signed)
Anesthesia Post Note  Patient: Chad Hampton  Procedure(s) Performed: MULTIPLE EXTRACTIONS OF TEETH NUMBERS ONE, SEVENTEEN, EIGHTEEN, NINETEEN, TWENTY-THREE, TWENTY-FOUR, TWENTY-FIVE, TWENTY-SIX. (N/A Mouth)     Patient location during evaluation: PACU Anesthesia Type: General Level of consciousness: awake and alert Pain management: pain level controlled Vital Signs Assessment: post-procedure vital signs reviewed and stable Respiratory status: spontaneous breathing, nonlabored ventilation and respiratory function stable Cardiovascular status: blood pressure returned to baseline and stable Postop Assessment: no apparent nausea or vomiting Anesthetic complications: no    Last Vitals:  Vitals:   06/06/17 1030 06/06/17 1040  BP: (!) 139/92 (!) 148/99  Pulse: 91 88  Resp: 20 20  Temp: 36.6 C   SpO2: 96% 97%    Last Pain:  Vitals:   06/06/17 1027  TempSrc:   PainSc: 0-No pain                 Lynda Rainwater

## 2017-06-07 ENCOUNTER — Encounter (HOSPITAL_COMMUNITY): Payer: Self-pay | Admitting: Oral Surgery

## 2017-06-09 NOTE — Op Note (Signed)
NAME:  Chad Hampton, Chad Hampton                      ACCOUNT NO.:  MEDICAL RECORD NO.:  [id number]  LOCATION:                                 FACILITY:  PHYSICIAN:  Gae Bon, M.D.       DATE OF BIRTH:  DATE OF PROCEDURE:  06/06/2017 DATE OF DISCHARGE:                              OPERATIVE REPORT   PREOPERATIVE DIAGNOSIS:  Nonrestorable teeth secondary to dental caries numbers 1, 17, 18, 19, 23, 24, 25, 26.  PROCEDURE:  Extraction of teeth numbers 1, 17, 18, 19, 23, 24, 25, 26.  SURGEON:  Gae Bon, MD.  ANESTHESIA:  General nasal intubation, Dr. Sabra Heck attending.  DESCRIPTION OF PROCEDURE:  The patient was taken to the operating room and placed on the table in supine position.  General anesthesia was administered intravenously and a nasal endotracheal tube was placed and secured.  The eyes were protected and then the patient was draped for surgery.  Time-out was performed.  The posterior pharynx was suctioned and a throat pack was placed.  A 2% lidocaine with 1:100,000 epinephrine was infiltrated in an inferior alveolar block on the right and left side and then buccal and palatal infiltration of tooth #1.  Additional anesthetic solution was given in the buccal aspect of the anterior mandible.  A bite block was placed on the right side of the mouth and a sweetheart retractor was used to retract the tongue and a #15 blade was used to make an envelope incision buccally in the area of teeth numbers 17, 18, 19.  The periosteum was reflected and bone was removed from around these teeth.  The teeth were then elevated and removed from the mouth with the dental forceps.  The sockets were then irrigated, curetted, and closed with 3-0 chromic.  In the anterior mandible, a 15 blade was used to make an incision around teeth #23, 24, 25, 26 in the gingival sulcus on the buccal and lingual aspects.  The periosteum was reflected.  The teeth were elevated and then removed from the mouth  with the 301 elevator and Asch forceps.  Tooth #23 fractured upon removal requiring additional bone removal using a Stryker handpiece under irrigation to remove the root.  Then, the areas were irrigated and closed with 3-0 chromic.  The bite block and sweetheart retractor were repositioned to the other side of the mouth.  A 15 blade used to make an incision around tooth #1.  The tooth was elevated after reflecting the periodontal tissue with a periodontal elevator.  Then, the tooth was removed with a dental forceps.  The socket was irrigated, curetted, and closed with 3-0 chromic.  The oral cavity was irrigated and suctioned. Throat pack was removed.  The patient was left in care of Anesthesia for awakening and transported to recovery room with plans for discharge home through Day Surgery.  ESTIMATED BLOOD LOSS:  Minimum.  COMPLICATIONS:  None.  SPECIMENS:  None.     Gae Bon, M.D.     SMJ/MEDQ  D:  06/06/2017  T:  06/07/2017  Job:  [id number]

## 2017-06-24 ENCOUNTER — Encounter: Payer: Self-pay | Admitting: Student

## 2017-06-24 ENCOUNTER — Ambulatory Visit (INDEPENDENT_AMBULATORY_CARE_PROVIDER_SITE_OTHER): Payer: Medicare Other | Admitting: Student

## 2017-06-24 VITALS — BP 114/84 | HR 86 | Ht 72.0 in | Wt 277.8 lb

## 2017-06-24 DIAGNOSIS — I10 Essential (primary) hypertension: Secondary | ICD-10-CM | POA: Diagnosis not present

## 2017-06-24 DIAGNOSIS — I2510 Atherosclerotic heart disease of native coronary artery without angina pectoris: Secondary | ICD-10-CM

## 2017-06-24 DIAGNOSIS — E785 Hyperlipidemia, unspecified: Secondary | ICD-10-CM

## 2017-06-24 DIAGNOSIS — R569 Unspecified convulsions: Secondary | ICD-10-CM | POA: Insufficient documentation

## 2017-06-24 DIAGNOSIS — Z720 Tobacco use: Secondary | ICD-10-CM | POA: Diagnosis not present

## 2017-06-24 DIAGNOSIS — E1169 Type 2 diabetes mellitus with other specified complication: Secondary | ICD-10-CM | POA: Insufficient documentation

## 2017-06-24 MED ORDER — RANITIDINE HCL 150 MG PO TABS: 150.0000 mg | ORAL_TABLET | Freq: Two times a day (BID) | ORAL | 5 refills | Status: DC

## 2017-06-24 MED ORDER — ATORVASTATIN CALCIUM 40 MG PO TABS: 40.0000 mg | ORAL_TABLET | Freq: Every day | ORAL | 5 refills | Status: DC

## 2017-06-24 NOTE — Progress Notes (Signed)
Cardiology Office Note    Date:  06/24/2017   ID:  SHANDY VI, DOB [birth year], MRN [id number]  PCP:  Lemmie Evens, MD  Cardiologist: Dorris Carnes, MD    Chief Complaint  Patient presents with  . Follow-up    3 month visit    History of Present Illness:    Chad Hampton is a 37 y.o. male with past medical history of CAD (nonobstructive CAD by cath in 01/2017), HTN, HLD, and continued tobacco use who presents to the office today for 67-month follow-up.   He was last examined by Jory Sims, DNP on 03/27/2017 and denied any recent chest pain or dyspnea on exertion. A recent NST had been performed and showed peri-infarct ischemia, therefore he underwent a cardiac catheterization in 01/2017 which showed nonobstructive CAD with a preserved EF of 55-65%. At the time of his visit, he had not been taking any medications regularly due to losing his insurance coverage, therefore he was restated on generic Atorvastatin 40mg  daily, Lisinopril 2.5mg  daily, Lopressor 12.5mg  BID, Ranitidine 150mg  BID, and OTC Nicoderm patches.   In talking with the patient today, he reports having chronic chest pain which is worse when bending over to pick up objects or with food consumption. He denies any exertional discomfort. Reports having baseline dyspnea on exertion which has been unchanged. Of note, he did walk from his home to the office today and denies any symptoms with this. He denies any recent orthopnea, PND, or lower extremity edema.  He has quit taking all of his medications as he reports being unable to afford them due to no longer receiving disability. Says he is unable to work and cannot pay for medications although they are $3 with his current insurance. He does continue to smoke 1-2 packs/day and uses marijuana regularly. Reports this is provided by family and friends and he is unsure if they would help with medications.   Past Medical History:  Diagnosis Date  . Acute angina ([place])   .  Arthritis   . Asthma   . CAD (coronary artery disease)    a. nonobstructive CAD by cath in 01/2017  . Dental caries   . Diabetes mellitus without complication ([place])   . GERD (gastroesophageal reflux disease)   . Headache   . Hypertension   . Myocardial infarction ([place])   . Seizure disorder ([place])   . Seizures ([place])     Past Surgical History:  Procedure Laterality Date  . CHOLECYSTECTOMY    . EYE SURGERY    . FACIAL RECONSTRUCTION SURGERY    . LEFT HEART CATH AND CORONARY ANGIOGRAPHY N/A 02/07/2017   Procedure: LEFT HEART CATH AND CORONARY ANGIOGRAPHY;  Surgeon: Jettie Booze, MD;  Location: [facility];  Service: Cardiovascular;  Laterality: N/A;  . MULTIPLE EXTRACTIONS WITH ALVEOLOPLASTY N/A 06/06/2017   Procedure: MULTIPLE EXTRACTIONS OF TEETH NUMBERS ONE, SEVENTEEN, EIGHTEEN, NINETEEN, TWENTY-THREE, TWENTY-FOUR, TWENTY-FIVE, TWENTY-SIX.;  Surgeon: Diona Browner, DDS;  Location: [facility];  Service: Oral Surgery;  Laterality: N/A;    Current Medications: Outpatient Medications Prior to Visit  Medication Sig Dispense Refill  . albuterol (PROVENTIL HFA;VENTOLIN HFA) 108 (90 Base) MCG/ACT inhaler Inhale 2 puffs into the lungs every 6 (six) hours as needed for wheezing or shortness of breath.    Marland Kitchen atorvastatin (LIPITOR) 40 MG tablet Take 1 tablet (40 mg total) by mouth daily. (Patient not taking: Reported on 05/27/2017) 30 tablet 3  . lisinopril (PRINIVIL,ZESTRIL) 2.5 MG tablet Take 1 tablet (2.5  mg total) by mouth daily. (Patient not taking: Reported on 05/27/2017) 30 tablet 3  . metoprolol tartrate (LOPRESSOR) 25 MG tablet Take 0.5 tablets (12.5 mg total) by mouth 2 (two) times daily. (Patient not taking: Reported on 05/27/2017) 30 tablet 3  . nitroGLYCERIN (NITROSTAT) 0.4 MG SL tablet Place 1 tablet (0.4 mg total) under the tongue every 5 (five) minutes as needed. (Patient not taking: Reported on 05/27/2017) 25 tablet 3  . oxyCODONE-acetaminophen (PERCOCET) 5-325 MG tablet Take  1-2 tablets by mouth every 4 (four) hours as needed. 30 tablet 0  . ranitidine (ZANTAC) 150 MG tablet Take 1 tablet (150 mg total) by mouth 2 (two) times daily. (Patient not taking: Reported on 05/27/2017) 60 tablet 3   No facility-administered medications prior to visit.      Allergies:   Morphine and related; Penicillins; Sulfa antibiotics; Tramadol; and Ibuprofen   Social History   Socioeconomic History  . Marital status: Widowed    Spouse name: None  . Number of children: None  . Years of education: None  . Highest education level: None  Social Needs  . Financial resource strain: None  . Food insecurity - worry: None  . Food insecurity - inability: None  . Transportation needs - medical: None  . Transportation needs - non-medical: None  Occupational History  . None  Tobacco Use  . Smoking status: Current Every Day Smoker    Packs/day: 1.50    Years: 20.00    Pack years: 30.00    Types: Cigarettes  . Smokeless tobacco: Former Systems developer    Types: Snuff  Substance and Sexual Activity  . Alcohol use: No    Frequency: Never    Comment: occasionally  . Drug use: Yes    Types: Marijuana    Comment: daily  . Sexual activity: No  Other Topics Concern  . None  Social History Narrative  . None     Family History:  The patient's family history includes Diabetes in his father and mother; Heart failure in his brother and father; Hypertension in his mother; Stroke in his father.   Review of Systems:   Please see the history of present illness.     General:  No chills, fever, night sweats or weight changes.  Cardiovascular:  No chest pain, edema, orthopnea, palpitations, paroxysmal nocturnal dyspnea. Positive for dyspnea on exertion.  Dermatological: No rash, lesions/masses Respiratory: No cough, dyspnea Urologic: No hematuria, dysuria Abdominal:   No nausea, vomiting, diarrhea, bright red blood per rectum, melena, or hematemesis Neurologic:  No visual changes, wkns, changes in  mental status. All other systems reviewed and are otherwise negative except as noted above.   Physical Exam:    VS:  BP 114/84   Pulse 86   Ht 6' (1.829 m)   Wt 277 lb 12.8 oz (126 kg)   SpO2 95%   BMI 37.68 kg/m    General: Well developed, well nourished Caucasian male appearing in no acute distress. Smells strongly of tobacco and other substances.  Head: Normocephalic, atraumatic, sclera non-icteric, no xanthomas, nares are without discharge.  Neck: No carotid bruits. JVD not elevated.  Lungs: Respirations regular and unlabored, without wheezes or rales.  Heart: Regular rate and rhythm. No S3 or S4.  No murmur, no rubs, or gallops appreciated. Abdomen: Soft, non-tender, non-distended with normoactive bowel sounds. No hepatomegaly. No rebound/guarding. No obvious abdominal masses. Msk:  Strength and tone appear normal for age. No joint deformities or effusions. Extremities: No clubbing or cyanosis.  No edema.  Distal pedal pulses are 2+ bilaterally. Neuro: Alert and oriented X 3. Moves all extremities spontaneously. No focal deficits noted. Psych:  Responds to questions appropriately with a normal affect. Skin: No rashes or lesions noted  Wt Readings from Last 3 Encounters:  06/24/17 277 lb 12.8 oz (126 kg)  06/06/17 275 lb (124.7 kg)  03/27/17 275 lb (124.7 kg)     Studies/Labs Reviewed:   EKG:  EKG is not ordered today.    Recent Labs: 06/06/2017: BUN 8; Creatinine, Ser 0.90; Hemoglobin 15.5; Platelets 251; Potassium 3.7; Sodium 137   Lipid Panel    Component Value Date/Time   CHOL 141 07/10/2015 1529   TRIG 310 (H) 07/10/2015 1529   HDL 17 (L) 07/10/2015 1529   CHOLHDL 8.3 (H) 07/10/2015 1529   VLDL 62 (H) 07/10/2015 1529   LDLCALC 62 07/10/2015 1529    Additional studies/ records that were reviewed today include:   Echocardiogram: 01/2017 Study Conclusions  - Left ventricle: The cavity size was normal. Systolic function was   normal. The estimated ejection  fraction was in the range of 60%   to 65%. Wall motion was normal; there were no regional wall   motion abnormalities. Left ventricular diastolic function   parameters were normal. - Aortic valve: There was mild regurgitation.  Cardiac Catheterization: 01/2017  Ost LAD to Prox LAD lesion, 25 %stenosed.  Ost Cx to Prox Cx lesion, 25 %stenosed.  The left ventricular systolic function is normal.  LV end diastolic pressure is normal.  The left ventricular ejection fraction is 55-65% by visual estimate.  There is no aortic valve stenosis.   Nonobstructive coronary artery disease.    Continue with aggressive primary prevention including smoking cessation.   Assessment:    1. Coronary artery disease involving native coronary artery of native heart without angina pectoris   2. Essential hypertension   3. Hyperlipidemia LDL goal <70   4. Tobacco abuse      Plan:   In order of problems listed above:  1. CAD/ Atypical Chest Pain - recent cardiac catheterization in 01/2017 showed nonobstructive CAD with a preserved EF of 55-65% as outlined above.  - he continues to have episodes of chest pain when bending over to pick up objects or with food consumption. Symptoms have worsened since he stopped Zantac. No exertional symptoms noted.  - reviewed his cath report with him in detail. Overall, his symptoms seem atypical for a cardiac etiology. No further testing indicated at this time given his recent catheterization. Will provide with Rx for Zantac.   2. HTN - BP is actually well-controlled at 114/84 and he has been off of medications for several months. Continued diet modification and exercise encouraged.  3. HLD - no recent FLP on file. Most recent results in [facility] are from 2017 and showed total cholesterol of 141, Triglycerides 310, HDL 17, and LDL 62. - he was started on high-intensity statin therapy at the time of his catheterization. Recommended resuming Atorvastatin 40mg  daily  in the setting of known CAD and continued tobacco use.   4. Tobacco Use - continues to smoke 1-2 ppd and uses Marijuana regularly. Cessation strongly advised.    Medication Adjustments/Labs and Tests Ordered: Current medicines are reviewed at length with the patient today.  Concerns regarding medicines are outlined above.  Medication changes, Labs and Tests ordered today are listed in the Patient Instructions below. Patient Instructions  Medication Instructions:  Your physician recommends that you continue on your  current medications as directed. Please refer to the Current Medication list given to you today. Take Lipitor 40 mg Daily  Take Zantac 150 mg Two Times Daily   Labwork: NONE   Testing/Procedures: NONE   Follow-Up: Your physician wants you to follow-up in: 6 Months with Dr. Harrington Challenger. You will receive a reminder letter in the mail two months in advance. If you don't receive a letter, please call our office to schedule the follow-up appointment.  Any Other Special Instructions Will Be Listed Below (If Applicable).  If you need a refill on your cardiac medications before your next appointment, please call your pharmacy. Thank you for choosing [facility]!     Signed, Erma Heritage, PA-C  06/24/2017 5:05 PM    [facility]. [address] [place], [place] [address] Phone: [phone]

## 2017-06-24 NOTE — Patient Instructions (Signed)
Medication Instructions:  Your physician recommends that you continue on your current medications as directed. Please refer to the Current Medication list given to you today. Take Lipitor 40 mg Daily  Take Zantac 150 mg Two Times Daily   Labwork: NONE   Testing/Procedures: NONE   Follow-Up: Your physician wants you to follow-up in: 6 Months with Dr. Harrington Challenger. You will receive a reminder letter in the mail two months in advance. If you don't receive a letter, please call our office to schedule the follow-up appointment.   Any Other Special Instructions Will Be Listed Below (If Applicable).     If you need a refill on your cardiac medications before your next appointment, please call your pharmacy. Thank you for choosing [facility]!

## 2017-07-13 ENCOUNTER — Other Ambulatory Visit: Payer: Self-pay

## 2017-07-13 ENCOUNTER — Encounter (HOSPITAL_COMMUNITY): Payer: Self-pay | Admitting: Emergency Medicine

## 2017-07-13 ENCOUNTER — Emergency Department (HOSPITAL_COMMUNITY)
Admission: EM | Admit: 2017-07-13 | Discharge: 2017-07-14 | Disposition: A | Discharge status: Home / Self Care | Payer: Medicare Other | Attending: Emergency Medicine | Admitting: Emergency Medicine

## 2017-07-13 ENCOUNTER — Emergency Department (HOSPITAL_COMMUNITY): Payer: Medicare Other

## 2017-07-13 DIAGNOSIS — Z79899 Other long term (current) drug therapy: Secondary | ICD-10-CM | POA: Diagnosis not present

## 2017-07-13 DIAGNOSIS — J441 Chronic obstructive pulmonary disease with (acute) exacerbation: Secondary | ICD-10-CM | POA: Diagnosis not present

## 2017-07-13 DIAGNOSIS — E119 Type 2 diabetes mellitus without complications: Secondary | ICD-10-CM | POA: Insufficient documentation

## 2017-07-13 DIAGNOSIS — I2510 Atherosclerotic heart disease of native coronary artery without angina pectoris: Secondary | ICD-10-CM | POA: Insufficient documentation

## 2017-07-13 DIAGNOSIS — I10 Essential (primary) hypertension: Secondary | ICD-10-CM | POA: Diagnosis not present

## 2017-07-13 DIAGNOSIS — J45909 Unspecified asthma, uncomplicated: Secondary | ICD-10-CM | POA: Diagnosis not present

## 2017-07-13 DIAGNOSIS — R0602 Shortness of breath: Secondary | ICD-10-CM | POA: Diagnosis not present

## 2017-07-13 DIAGNOSIS — R05 Cough: Secondary | ICD-10-CM | POA: Diagnosis not present

## 2017-07-13 DIAGNOSIS — F17210 Nicotine dependence, cigarettes, uncomplicated: Secondary | ICD-10-CM | POA: Diagnosis not present

## 2017-07-13 LAB — CBC WITH DIFFERENTIAL/PLATELET
Basophils Absolute: 0 10*3/uL (ref 0.0–0.1)
Basophils Relative: 0 %
EOS PCT: 0 %
Eosinophils Absolute: 0 10*3/uL (ref 0.0–0.7)
HCT: 45.4 % (ref 39.0–52.0)
Hemoglobin: 15.3 g/dL (ref 13.0–17.0)
LYMPHS ABS: 2.2 10*3/uL (ref 0.7–4.0)
LYMPHS PCT: 33 %
MCH: 30.7 pg (ref 26.0–34.0)
MCHC: 33.7 g/dL (ref 30.0–36.0)
MCV: 91.2 fL (ref 78.0–100.0)
MONO ABS: 0.6 10*3/uL (ref 0.1–1.0)
Monocytes Relative: 9 %
Neutro Abs: 3.8 10*3/uL (ref 1.7–7.7)
Neutrophils Relative %: 58 %
PLATELETS: 191 10*3/uL (ref 150–400)
RBC: 4.98 MIL/uL (ref 4.22–5.81)
RDW: 13.6 % (ref 11.5–15.5)
WBC: 6.7 10*3/uL (ref 4.0–10.5)

## 2017-07-13 LAB — BASIC METABOLIC PANEL
Anion gap: 13 (ref 5–15)
BUN: 9 mg/dL (ref 6–20)
CALCIUM: 9 mg/dL (ref 8.9–10.3)
CO2: 21 mmol/L — ABNORMAL LOW (ref 22–32)
CREATININE: 0.92 mg/dL (ref 0.61–1.24)
Chloride: 103 mmol/L (ref 101–111)
GFR calc Af Amer: >60 mL/min (ref >60)
Glucose, Bld: 104 mg/dL — ABNORMAL HIGH (ref 65–99)
Potassium: 3.2 mmol/L — ABNORMAL LOW (ref 3.5–5.1)
Sodium: 137 mmol/L (ref 135–145)

## 2017-07-13 MED ORDER — PREDNISONE 10 MG PO TABS: 10.0000 mg | ORAL_TABLET | Freq: Every day | ORAL | 0 refills | Status: DC

## 2017-07-13 MED ORDER — ALBUTEROL SULFATE (2.5 MG/3ML) 0.083% IN NEBU
5.0000 mg | INHALATION_SOLUTION | Freq: Once | RESPIRATORY_TRACT | Status: AC
  Administered 2017-07-13: 5 mg via RESPIRATORY_TRACT
  Filled 2017-07-13: qty 6

## 2017-07-13 MED ORDER — METHYLPREDNISOLONE SODIUM SUCC 125 MG IJ SOLR
125.0000 mg | Freq: Once | INTRAMUSCULAR | Status: AC
Start: 2017-07-13 — End: 2017-07-13
  Administered 2017-07-13: 125 mg via INTRAVENOUS
  Filled 2017-07-13: qty 2

## 2017-07-13 MED ORDER — HYDROCOD POLST-CPM POLST ER 10-8 MG/5ML PO SUER: 5.0000 mL | Freq: Two times a day (BID) | ORAL | 0 refills | Status: DC | PRN

## 2017-07-13 MED ORDER — ACETAMINOPHEN 500 MG PO TABS
1000.0000 mg | ORAL_TABLET | Freq: Once | ORAL | Status: AC
Start: 2017-07-13 — End: 2017-07-13
  Administered 2017-07-13: 1000 mg via ORAL
  Filled 2017-07-13: qty 2

## 2017-07-13 MED ORDER — IPRATROPIUM-ALBUTEROL 0.5-2.5 (3) MG/3ML IN SOLN
3.0000 mL | Freq: Once | RESPIRATORY_TRACT | Status: AC
  Administered 2017-07-13: 3 mL via RESPIRATORY_TRACT
  Filled 2017-07-13: qty 3

## 2017-07-13 MED ORDER — HYDROCOD POLST-CPM POLST ER 10-8 MG/5ML PO SUER
5.0000 mL | Freq: Once | ORAL | Status: AC
  Administered 2017-07-13: 5 mL via ORAL
  Filled 2017-07-13: qty 5

## 2017-07-13 MED ORDER — ALBUTEROL SULFATE (2.5 MG/3ML) 0.083% IN NEBU: 2.5000 mg | INHALATION_SOLUTION | Freq: Four times a day (QID) | RESPIRATORY_TRACT | 4 refills | Status: AC | PRN

## 2017-07-13 MED ORDER — SODIUM CHLORIDE 0.9 % IV BOLUS (SEPSIS)
500.0000 mL | Freq: Once | INTRAVENOUS | Status: AC
  Administered 2017-07-13: 500 mL via INTRAVENOUS

## 2017-07-13 NOTE — ED Provider Notes (Signed)
[facility] EMERGENCY DEPARTMENT Provider Note   CSN: [id number] Arrival date & time: 07/13/17  2053     History   Chief Complaint Chief Complaint  Patient presents with  . Shortness of Breath    HPI Chad Hampton is a 37 y.o. male.  Patient presents with persistent cough and wheezing after working on an automobile yesterday and smelling the exhaust fumes.  His wife reports that he has been wheezing for 1 month.  He continues to smoke cigarettes.  Past medical history includes coronary artery disease, hypertension, seizure disorder, diabetes.  No fever, sweats, chills, rusty sputum.  Severity of symptoms is moderate.      Past Medical History:  Diagnosis Date  . Acute angina ([place])   . Arthritis   . Asthma   . CAD (coronary artery disease)    a. nonobstructive CAD by cath in 01/2017  . Dental caries   . Diabetes mellitus without complication ([place])   . GERD (gastroesophageal reflux disease)   . Headache   . Hypertension   . Myocardial infarction ([place])   . Seizure disorder ([place])   . Seizures [facility])     Patient Active Problem List   Diagnosis Date Noted  . DM coma, type 2, not at goal [facility]) 06/24/2017  . Seizure ([place]) 06/24/2017  . Abnormal cardiovascular stress test   . MDD (major depressive disorder), recurrent severe, without psychosis ([place]) 05/11/2015  . Cannabis abuse 05/11/2015  . Substance induced mood disorder ([place]) 05/11/2015    Past Surgical History:  Procedure Laterality Date  . CHOLECYSTECTOMY    . EYE SURGERY    . FACIAL RECONSTRUCTION SURGERY    . LEFT HEART CATH AND CORONARY ANGIOGRAPHY N/A 02/07/2017   Procedure: LEFT HEART CATH AND CORONARY ANGIOGRAPHY;  Surgeon: Jettie Booze, MD;  Location: [facility];  Service: Cardiovascular;  Laterality: N/A;  . MULTIPLE EXTRACTIONS WITH ALVEOLOPLASTY N/A 06/06/2017   Procedure: MULTIPLE EXTRACTIONS OF TEETH NUMBERS ONE, SEVENTEEN, EIGHTEEN, NINETEEN, TWENTY-THREE, TWENTY-FOUR, TWENTY-FIVE,  TWENTY-SIX.;  Surgeon: Diona Browner, DDS;  Location: [facility];  Service: Oral Surgery;  Laterality: N/A;       Home Medications    Prior to Admission medications   Medication Sig Start Date End Date Taking? Authorizing Provider  albuterol (PROVENTIL HFA;VENTOLIN HFA) 108 (90 Base) MCG/ACT inhaler Inhale 2 puffs into the lungs every 6 (six) hours as needed for wheezing or shortness of breath.    [provider]  atorvastatin (LIPITOR) 40 MG tablet Take 1 tablet (40 mg total) by mouth daily. 06/24/17 12/21/17  Erma Heritage, PA-C  chlorpheniramine-HYDROcodone (TUSSIONEX PENNKINETIC ER) 10-8 MG/5ML SUER Take 5 mLs by mouth every 12 (twelve) hours as needed for cough. 07/13/17   Nat Christen, MD  predniSONE (DELTASONE) 10 MG tablet Take 1 tablet (10 mg total) by mouth daily with breakfast. 3 tablets for 4 days, 2 tablets for 4 days, 1 tablet for 4 days 07/13/17   Nat Christen, MD  ranitidine (ZANTAC) 150 MG tablet Take 1 tablet (150 mg total) by mouth 2 (two) times daily. 06/24/17   Erma Heritage, PA-C    Family History Family History  Problem Relation Age of Onset  . Diabetes Mother   . Hypertension Mother   . Heart failure Father   . Stroke Father   . Diabetes Father   . Heart failure Brother     Social History Social History   Tobacco Use  . Smoking status: Current Every Day Smoker  Packs/day: 0.50    Years: 20.00    Pack years: 10.00    Types: Cigarettes  . Smokeless tobacco: Former Systems developer    Types: Snuff  Substance Use Topics  . Alcohol use: No    Frequency: Never    Comment: occasionally  . Drug use: Yes    Types: Marijuana    Comment: daily     Allergies   Morphine and related; Penicillins; Sulfa antibiotics; Tramadol; and Ibuprofen   Review of Systems Review of Systems  All other systems reviewed and are negative.    Physical Exam Updated Vital Signs BP 124/65   Pulse 98   Temp (!) 100.5 F (38.1 C) (Oral)   Resp 17   SpO2 98%    Physical Exam  Constitutional: He is oriented to person, place, and time. He appears well-developed and well-nourished.  HENT:  Head: Normocephalic and atraumatic.  Tonsils are enlarged.  Eyes: Conjunctivae are normal.  Neck: Neck supple.  Cardiovascular: Normal rate and regular rhythm.  Pulmonary/Chest: Effort normal and breath sounds normal.  Bilateral expiratory wheeze.  Abdominal: Soft. Bowel sounds are normal.  Musculoskeletal: Normal range of motion.  Neurological: He is alert and oriented to person, place, and time.  Skin: Skin is warm and dry.  Psychiatric: He has a normal mood and affect. His behavior is normal.  Nursing note and vitals reviewed.    ED Treatments / Results  Labs (all labs ordered are listed, but only abnormal results are displayed) Labs Reviewed  BASIC METABOLIC PANEL - Abnormal; Notable for the following components:      Result Value   Potassium 3.2 (*)    CO2 21 (*)    Glucose, Bld 104 (*)    All other components within normal limits  CBC WITH DIFFERENTIAL/PLATELET    EKG  EKG Interpretation  Date/Time:  Sunday July 13 2017 21:19:48 EDT Ventricular Rate:  107 PR Interval:    QRS Duration: 90 QT Interval:  321 QTC Calculation: 429 R Axis:   73 Text Interpretation:  Sinus tachycardia Confirmed by Nat Christen [id number]) on 07/13/2017 9:35:08 PM       Radiology No results found.  Procedures Procedures (including critical care time)  Medications Ordered in ED Medications  albuterol (PROVENTIL) (2.5 MG/3ML) 0.083% nebulizer solution 5 mg (5 mg Nebulization Given 07/13/17 2136)  sodium chloride 0.9 % bolus 500 mL (500 mLs Intravenous New Bag/Given 07/13/17 2208)  methylPREDNISolone sodium succinate (SOLU-MEDROL) 125 mg/2 mL injection 125 mg (125 mg Intravenous Given 07/13/17 2209)  ipratropium-albuterol (DUONEB) 0.5-2.5 (3) MG/3ML nebulizer solution 3 mL (3 mLs Nebulization Given 07/13/17 2241)  chlorpheniramine-HYDROcodone (TUSSIONEX) 10-8  MG/5ML suspension 5 mL (5 mLs Oral Given 07/13/17 2237)     Initial Impression / Assessment and Plan / ED Course  I have reviewed the triage vital signs and the nursing notes.  Pertinent labs & imaging results that were available during my care of the patient were reviewed by me and considered in my medical decision making (see chart for details).     Patient presents with coughing and wheezing.  His O2 sats are acceptable.  Chest x-ray shows no pneumonia.  He was treated with IV steroids and nebulizer treatments.  Will discharge home on prednisone, Tussionex, albuterol nebulizer solution  Final Clinical Impressions(s) / ED Diagnoses   Final diagnoses:  COPD exacerbation [facility])    ED Discharge Orders        Ordered    predniSONE (DELTASONE) 10 MG tablet  Daily with  breakfast     07/13/17 2314    chlorpheniramine-HYDROcodone (TUSSIONEX PENNKINETIC ER) 10-8 MG/5ML SUER  Every 12 hours PRN     07/13/17 2314       Nat Christen, MD 07/14/17 [phone]

## 2017-07-13 NOTE — ED Notes (Signed)
Pt alert & oriented x4, stable gait. Patient given discharge instructions, paperwork & prescription(s). Patient  instructed to stop at the registration desk to finish any additional paperwork. Patient verbalized understanding. Pt left department w/ no further questions. 

## 2017-07-13 NOTE — ED Triage Notes (Signed)
"  real bad cough and it is hard from me to breathe" since yesterday  Has CAD by description- "blockages"

## 2017-07-13 NOTE — Discharge Instructions (Signed)
Prescription for prednisone, cough syrup, nebulizer solution and machine.  Stop smoking.  Return if worse.

## 2017-08-04 ENCOUNTER — Other Ambulatory Visit: Payer: Self-pay

## 2017-08-04 ENCOUNTER — Emergency Department (HOSPITAL_COMMUNITY)
Admission: EM | Admit: 2017-08-04 | Discharge: 2017-08-04 | Disposition: A | Discharge status: Home / Self Care | Payer: Medicaid Other | Attending: Emergency Medicine | Admitting: Emergency Medicine

## 2017-08-04 ENCOUNTER — Encounter (HOSPITAL_COMMUNITY): Payer: Self-pay

## 2017-08-04 DIAGNOSIS — J028 Acute pharyngitis due to other specified organisms: Secondary | ICD-10-CM | POA: Diagnosis not present

## 2017-08-04 DIAGNOSIS — F17210 Nicotine dependence, cigarettes, uncomplicated: Secondary | ICD-10-CM | POA: Diagnosis not present

## 2017-08-04 DIAGNOSIS — I2510 Atherosclerotic heart disease of native coronary artery without angina pectoris: Secondary | ICD-10-CM | POA: Insufficient documentation

## 2017-08-04 DIAGNOSIS — E119 Type 2 diabetes mellitus without complications: Secondary | ICD-10-CM | POA: Diagnosis not present

## 2017-08-04 DIAGNOSIS — J029 Acute pharyngitis, unspecified: Secondary | ICD-10-CM

## 2017-08-04 DIAGNOSIS — I10 Essential (primary) hypertension: Secondary | ICD-10-CM | POA: Diagnosis not present

## 2017-08-04 DIAGNOSIS — H66001 Acute suppurative otitis media without spontaneous rupture of ear drum, right ear: Secondary | ICD-10-CM | POA: Diagnosis not present

## 2017-08-04 DIAGNOSIS — R1110 Vomiting, unspecified: Secondary | ICD-10-CM | POA: Insufficient documentation

## 2017-08-04 DIAGNOSIS — R070 Pain in throat: Secondary | ICD-10-CM | POA: Diagnosis not present

## 2017-08-04 DIAGNOSIS — I252 Old myocardial infarction: Secondary | ICD-10-CM | POA: Insufficient documentation

## 2017-08-04 DIAGNOSIS — F1210 Cannabis abuse, uncomplicated: Secondary | ICD-10-CM | POA: Insufficient documentation

## 2017-08-04 DIAGNOSIS — H6121 Impacted cerumen, right ear: Secondary | ICD-10-CM

## 2017-08-04 DIAGNOSIS — H9201 Otalgia, right ear: Secondary | ICD-10-CM | POA: Diagnosis present

## 2017-08-04 LAB — RAPID STREP SCREEN (MED CTR MEBANE ONLY): STREPTOCOCCUS, GROUP A SCREEN (DIRECT): NEGATIVE

## 2017-08-04 MED ORDER — HYDROGEN PEROXIDE 3 % EX SOLN
CUTANEOUS | Status: AC
  Administered 2017-08-04: 1
  Filled 2017-08-04: qty 473

## 2017-08-04 MED ORDER — ANTIPYRINE-BENZOCAINE 5.4-1.4 % OT SOLN: 3.0000 [drp] | OTIC | 0 refills | Status: DC | PRN

## 2017-08-04 MED ORDER — AZITHROMYCIN 250 MG PO TABS: ORAL_TABLET | ORAL | 0 refills | Status: DC

## 2017-08-04 NOTE — ED Notes (Signed)
Large amount of wax removed by flushing from bilateral ears.

## 2017-08-04 NOTE — Discharge Instructions (Addendum)
Take your entire course of the antibiotics prescribed and use the ear drop if needed for pain relief.

## 2017-08-04 NOTE — ED Triage Notes (Signed)
Pt c/o sore throat and earache since yesterday.  Reports vomited x 1 around 0230 this morning.

## 2017-08-06 LAB — CULTURE, GROUP A STREP (THRC)

## 2017-08-06 NOTE — ED Provider Notes (Signed)
[facility] EMERGENCY DEPARTMENT Provider Note   CSN: [id number] Arrival date & time: 08/04/17  7124     History   Chief Complaint Chief Complaint  Patient presents with  . Sore Throat  . Otalgia    HPI Chad Hampton is a 37 y.o. male.  The history is provided by the patient.  Sore Throat  This is a new problem. The current episode started yesterday. The problem occurs constantly. The problem has not changed since onset.Pertinent negatives include no chest pain, no abdominal pain, no headaches and no shortness of breath. Associated symptoms comments: Also reports right ear pain and had emesis x 1 last night. No continued n/v. No drainage from the ear. He does endorse decreased hearing acuity.. Nothing relieves the symptoms. He has tried nothing for the symptoms.  Otalgia  The current episode started yesterday. There is pain in the right ear. The problem occurs constantly. The problem has not changed since onset.There has been no fever. Associated symptoms include hearing loss and sore throat. Pertinent negatives include no ear discharge, no headaches, no rhinorrhea, no abdominal pain and no cough.    Past Medical History:  Diagnosis Date  . Acute angina ([place])   . Arthritis   . Asthma   . CAD (coronary artery disease)    a. nonobstructive CAD by cath in 01/2017  . Dental caries   . Diabetes mellitus without complication ([place])   . GERD (gastroesophageal reflux disease)   . Headache   . Hypertension   . Myocardial infarction ([place])   . Seizure disorder ([place])   . Seizures [facility])     Patient Active Problem List   Diagnosis Date Noted  . DM coma, type 2, not at goal [facility]) 06/24/2017  . Seizure ([place]) 06/24/2017  . Abnormal cardiovascular stress test   . MDD (major depressive disorder), recurrent severe, without psychosis ([place]) 05/11/2015  . Cannabis abuse 05/11/2015  . Substance induced mood disorder ([place]) 05/11/2015    Past Surgical History:  Procedure Laterality Date    . CHOLECYSTECTOMY    . EYE SURGERY    . FACIAL RECONSTRUCTION SURGERY    . LEFT HEART CATH AND CORONARY ANGIOGRAPHY N/A 02/07/2017   Procedure: LEFT HEART CATH AND CORONARY ANGIOGRAPHY;  Surgeon: Jettie Booze, MD;  Location: [place] CV LAB;  Service: Cardiovascular;  Laterality: N/A;  . MULTIPLE EXTRACTIONS WITH ALVEOLOPLASTY N/A 06/06/2017   Procedure: MULTIPLE EXTRACTIONS OF TEETH NUMBERS ONE, SEVENTEEN, EIGHTEEN, NINETEEN, TWENTY-THREE, TWENTY-FOUR, TWENTY-FIVE, TWENTY-SIX.;  Surgeon: Diona Browner, DDS;  Location: [place];  Service: Oral Surgery;  Laterality: N/A;        Home Medications    Prior to Admission medications   Medication Sig Start Date End Date Taking? Authorizing Provider  albuterol (PROVENTIL) (2.5 MG/3ML) 0.083% nebulizer solution Take 3 mLs (2.5 mg total) by nebulization every 6 (six) hours as needed for wheezing or shortness of breath. 07/13/17  Yes Nat Christen, MD  atorvastatin (LIPITOR) 40 MG tablet Take 1 tablet (40 mg total) by mouth daily. 06/24/17 12/21/17 Yes Strader, Fransisco Hertz, PA-C  ranitidine (ZANTAC) 150 MG tablet Take 1 tablet (150 mg total) by mouth 2 (two) times daily. 06/24/17  Yes Strader, [place] M, PA-C  antipyrine-benzocaine Toniann Fail) OTIC solution Place 3-4 drops into the right ear every 2 (two) hours as needed for ear pain. Compounded solution of 1% hydrocortisone and 2% benzocaine compounded solution. 08/04/17   Evalee Jefferson, PA-C  azithromycin (ZITHROMAX Z-PAK) 250 MG tablet Take 2 tablets by  mouth on day one followed by one tablet daily for 4 days. 08/04/17   Evalee Jefferson, PA-C  chlorpheniramine-HYDROcodone (TUSSIONEX PENNKINETIC ER) 10-8 MG/5ML SUER Take 5 mLs by mouth every 12 (twelve) hours as needed for cough. Patient not taking: Reported on 08/04/2017 07/13/17   Nat Christen, MD  predniSONE (DELTASONE) 10 MG tablet Take 1 tablet (10 mg total) by mouth daily with breakfast. 3 tablets for 4 days, 2 tablets for 4 days, 1 tablet for 4 days Patient  not taking: Reported on 08/04/2017 07/13/17   Nat Christen, MD    Family History Family History  Problem Relation Age of Onset  . Diabetes Mother   . Hypertension Mother   . Heart failure Father   . Stroke Father   . Diabetes Father   . Heart failure Brother     Social History Social History   Tobacco Use  . Smoking status: Current Every Day Smoker    Packs/day: 0.50    Years: 20.00    Pack years: 10.00    Types: Cigarettes  . Smokeless tobacco: Former Systems developer    Types: Snuff  Substance Use Topics  . Alcohol use: No    Frequency: Never    Comment: occasionally  . Drug use: Yes    Types: Marijuana    Comment: daily     Allergies   Morphine and related; Penicillins; Sulfa antibiotics; Tramadol; and Ibuprofen   Review of Systems Review of Systems  Constitutional: Negative for chills and fever.  HENT: Positive for ear pain, hearing loss and sore throat. Negative for congestion, ear discharge, rhinorrhea, sinus pressure, trouble swallowing and voice change.   Eyes: Negative for discharge.  Respiratory: Negative for cough, shortness of breath, wheezing and stridor.   Cardiovascular: Negative for chest pain.  Gastrointestinal: Negative for abdominal pain.  Genitourinary: Negative.   Neurological: Negative for headaches.     Physical Exam Updated Vital Signs BP 124/72   Pulse 78   Temp 98.1 F (36.7 C) (Oral)   Resp 18   Ht 6' (1.829 m)   Wt 124.7 kg (275 lb)   SpO2 96%   BMI 37.30 kg/m   Physical Exam  Constitutional: He is oriented to person, place, and time. He appears well-developed and well-nourished.  HENT:  Head: Normocephalic and atraumatic.  Right Ear: Ear canal normal.  Left Ear: Tympanic membrane and ear canal normal.  Nose: No mucosal edema or rhinorrhea.  Mouth/Throat: Uvula is midline, oropharynx is clear and moist and mucous membranes are normal. No trismus in the jaw. No uvula swelling. No oropharyngeal exudate, posterior oropharyngeal edema,  posterior oropharyngeal erythema or tonsillar abscesses.  Right ear cerumen impaction. Tonsillar hypertrophy, symmetric, no erythema or exudate.   Eyes: Conjunctivae are normal.  Cardiovascular: Normal rate and normal heart sounds.  Pulmonary/Chest: Effort normal. No respiratory distress. He has no wheezes. He has no rales.  Abdominal: Soft. There is no tenderness.  Musculoskeletal: Normal range of motion.  Neurological: He is alert and oriented to person, place, and time.  Skin: Skin is warm and dry. No rash noted.  Psychiatric: He has a normal mood and affect.  Vitals reviewed.    ED Treatments / Results  Labs (all labs ordered are listed, but only abnormal results are displayed) Labs Reviewed  RAPID STREP SCREEN (NOT AT [facility])  CULTURE, GROUP A STREP [facility])    EKG None  Radiology No results found.  Procedures .Ear Cerumen Removal Date/Time: 08/04/2017 9:35 AM Performed by: Evalee Jefferson, PA-C  Authorized by: Evalee Jefferson, PA-C   Consent:    Consent obtained:  Verbal   Consent given by:  Patient   Risks discussed:  Bleeding, dizziness, pain and TM perforation   Alternatives discussed:  No treatment Procedure details:    Location:  R ear   Procedure type: irrigation   Post-procedure details:    Inspection:  TM intact   Hearing quality:  Improved   Patient tolerance of procedure:  Tolerated well, no immediate complications Comments:     Recheck of right ear reveals edema, erythematous right TM.   (including critical care time)    Medications Ordered in ED Medications  hydrogen peroxide 3 % external solution (1 application  Given 10/31/08 0835)     Initial Impression / Assessment and Plan / ED Course  I have reviewed the triage vital signs and the nursing notes.  Pertinent labs & imaging results that were available during my care of the patient were reviewed by me and considered in my medical decision making (see chart for details).     Pt with right otitis  media, noted after cerumen removal. Strep negative, cx pending. Suspect this is referred pain from the otitis. He was placed on zithromax given PCN allergy. Auralgan for ear pain. F/u with pcp prn for persistent sx.  Final Clinical Impressions(s) / ED Diagnoses   Final diagnoses:  Non-recurrent acute suppurative otitis media of right ear without spontaneous rupture of tympanic membrane  Viral pharyngitis  Impacted cerumen of right ear    ED Discharge Orders        Ordered    azithromycin (ZITHROMAX Z-PAK) 250 MG tablet     08/04/17 0941    antipyrine-benzocaine (AURALGAN) OTIC solution  Every 2 hours PRN     08/04/17 0941       Evalee Jefferson, PA-C 08/06/17 0106    Francine Graven, DO 08/08/17 [phone]

## 2017-10-10 ENCOUNTER — Encounter (HOSPITAL_COMMUNITY): Payer: Self-pay

## 2017-10-10 ENCOUNTER — Other Ambulatory Visit: Payer: Self-pay

## 2017-10-10 ENCOUNTER — Emergency Department (HOSPITAL_COMMUNITY): Payer: Medicare Other

## 2017-10-10 ENCOUNTER — Emergency Department (HOSPITAL_COMMUNITY)
Admission: EM | Admit: 2017-10-10 | Discharge: 2017-10-10 | Disposition: A | Discharge status: Home / Self Care | Payer: Medicare Other | Attending: Emergency Medicine | Admitting: Emergency Medicine

## 2017-10-10 DIAGNOSIS — I2510 Atherosclerotic heart disease of native coronary artery without angina pectoris: Secondary | ICD-10-CM | POA: Diagnosis not present

## 2017-10-10 DIAGNOSIS — Z79899 Other long term (current) drug therapy: Secondary | ICD-10-CM | POA: Insufficient documentation

## 2017-10-10 DIAGNOSIS — I252 Old myocardial infarction: Secondary | ICD-10-CM | POA: Diagnosis not present

## 2017-10-10 DIAGNOSIS — J441 Chronic obstructive pulmonary disease with (acute) exacerbation: Secondary | ICD-10-CM | POA: Insufficient documentation

## 2017-10-10 DIAGNOSIS — R05 Cough: Secondary | ICD-10-CM | POA: Diagnosis not present

## 2017-10-10 DIAGNOSIS — I10 Essential (primary) hypertension: Secondary | ICD-10-CM | POA: Insufficient documentation

## 2017-10-10 DIAGNOSIS — E119 Type 2 diabetes mellitus without complications: Secondary | ICD-10-CM | POA: Diagnosis not present

## 2017-10-10 DIAGNOSIS — R0789 Other chest pain: Secondary | ICD-10-CM | POA: Diagnosis not present

## 2017-10-10 DIAGNOSIS — F17210 Nicotine dependence, cigarettes, uncomplicated: Secondary | ICD-10-CM | POA: Insufficient documentation

## 2017-10-10 DIAGNOSIS — R0602 Shortness of breath: Secondary | ICD-10-CM | POA: Diagnosis not present

## 2017-10-10 HISTORY — DX: Chronic obstructive pulmonary disease, unspecified: J44.9

## 2017-10-10 MED ORDER — ALBUTEROL SULFATE HFA 108 (90 BASE) MCG/ACT IN AERS
2.0000 | INHALATION_SPRAY | Freq: Once | RESPIRATORY_TRACT | Status: AC
  Administered 2017-10-10: 2 via RESPIRATORY_TRACT
  Filled 2017-10-10: qty 6.7

## 2017-10-10 MED ORDER — DEXAMETHASONE 4 MG PO TABS
12.0000 mg | ORAL_TABLET | Freq: Once | ORAL | Status: AC
  Administered 2017-10-10: 12 mg via ORAL
  Filled 2017-10-10: qty 3

## 2017-10-10 MED ORDER — ALBUTEROL SULFATE (2.5 MG/3ML) 0.083% IN NEBU
5.0000 mg | INHALATION_SOLUTION | Freq: Once | RESPIRATORY_TRACT | Status: AC
  Administered 2017-10-10: 5 mg via RESPIRATORY_TRACT
  Filled 2017-10-10: qty 6

## 2017-10-10 MED ORDER — IPRATROPIUM-ALBUTEROL 0.5-2.5 (3) MG/3ML IN SOLN
3.0000 mL | Freq: Once | RESPIRATORY_TRACT | Status: AC
  Administered 2017-10-10: 3 mL via RESPIRATORY_TRACT
  Filled 2017-10-10: qty 3

## 2017-10-10 MED ORDER — PREDNISONE 20 MG PO TABS: 40.0000 mg | ORAL_TABLET | Freq: Every day | ORAL | 0 refills | Status: DC

## 2017-10-10 NOTE — ED Notes (Signed)
MD at bedside. 

## 2017-10-10 NOTE — ED Triage Notes (Signed)
Pt reports hx of COPD with nonproductive cough. States approx 3 hours ago began feeling SOB with chest pressure. Pt is out of medication

## 2017-10-10 NOTE — ED Provider Notes (Signed)
[facility] EMERGENCY DEPARTMENT Provider Note   CSN: [id number] Arrival date & time: 10/10/17  0719     History   Chief Complaint Chief Complaint  Patient presents with  . Shortness of Breath    HPI NIL Chad Hampton is a 37 y.o. male.  HPI   37 year old male with shortness of breath.  Symptom onset about 3 hours prior to arrival.  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  Cough and has noticed wheezing.  Some pressure in the center of his chest.  He does have a past history of COPD.  Says it is past history of CAD.  He was in his usual state of health when he woke up today.  No unusual leg pain or swelling.  Past Medical History:  Diagnosis Date  . Acute angina ([place])   . Arthritis   . Asthma   . CAD (coronary artery disease)    a. nonobstructive CAD by cath in 01/2017  . COPD (chronic obstructive pulmonary disease) ([place])   . Dental caries   . Diabetes mellitus without complication ([place])   . GERD (gastroesophageal reflux disease)   . Headache   . Hypertension   . Myocardial infarction ([place])   . Seizure disorder ([place])   . Seizures [facility])     Patient Active Problem List   Diagnosis Date Noted  . DM coma, type 2, not at goal [facility]) 06/24/2017  . Seizure ([place]) 06/24/2017  . Abnormal cardiovascular stress test   . MDD (major depressive disorder), recurrent severe, without psychosis ([place]) 05/11/2015  . Cannabis abuse 05/11/2015  . Substance induced mood disorder ([place]) 05/11/2015    Past Surgical History:  Procedure Laterality Date  . CHOLECYSTECTOMY    . EYE SURGERY    . FACIAL RECONSTRUCTION SURGERY    . LEFT HEART CATH AND CORONARY ANGIOGRAPHY N/A 02/07/2017   Procedure: LEFT HEART CATH AND CORONARY ANGIOGRAPHY;  Surgeon: Jettie Booze, MD;  Location: [place] CV LAB;  Service: Cardiovascular;  Laterality: N/A;  . MULTIPLE EXTRACTIONS WITH ALVEOLOPLASTY N/A 06/06/2017   Procedure: MULTIPLE EXTRACTIONS OF TEETH NUMBERS  ONE, SEVENTEEN, EIGHTEEN, NINETEEN, TWENTY-THREE, TWENTY-FOUR, TWENTY-FIVE, TWENTY-SIX.;  Surgeon: Diona Browner, DDS;  Location: [place];  Service: Oral Surgery;  Laterality: N/A;        Home Medications    Prior to Admission medications   Medication Sig Start Date End Date Taking? Authorizing Provider  albuterol (PROVENTIL) (2.5 MG/3ML) 0.083% nebulizer solution Take 3 mLs (2.5 mg total) by nebulization every 6 (six) hours as needed for wheezing or shortness of breath. 07/13/17   Nat Christen, MD  antipyrine-benzocaine Toniann Fail) OTIC solution Place 3-4 drops into the right ear every 2 (two) hours as needed for ear pain. Compounded solution of 1% hydrocortisone and 2% benzocaine compounded solution. 08/04/17   Evalee Jefferson, PA-C  atorvastatin (LIPITOR) 40 MG tablet Take 1 tablet (40 mg total) by mouth daily. 06/24/17 12/21/17  Erma Heritage, PA-C  azithromycin (ZITHROMAX Z-PAK) 250 MG tablet Take 2 tablets by mouth on day one followed by one tablet daily for 4 days. 08/04/17   Evalee Jefferson, PA-C  chlorpheniramine-HYDROcodone (TUSSIONEX PENNKINETIC ER) 10-8 MG/5ML SUER Take 5 mLs by mouth every 12 (twelve) hours as needed for cough. Patient not taking: Reported on 08/04/2017 07/13/17   Nat Christen, MD  predniSONE (DELTASONE) 10 MG tablet Take 1 tablet (10 mg total) by mouth daily with breakfast. 3 tablets for 4 days, 2 tablets  for 4 days, 1 tablet for 4 days Patient not taking: Reported on 08/04/2017 07/13/17   Nat Christen, MD  ranitidine (ZANTAC) 150 MG tablet Take 1 tablet (150 mg total) by mouth 2 (two) times daily. 06/24/17   Erma Heritage, PA-C    Family History Family History  Problem Relation Age of Onset  . Diabetes Mother   . Hypertension Mother   . Heart failure Father   . Stroke Father   . Diabetes Father   . Heart failure Brother     Social History Social History   Tobacco Use  . Smoking status: Current Every Day Smoker    Packs/day: 0.50    Years: 20.00    Pack years:  10.00    Types: Cigarettes  . Smokeless tobacco: Former Systems developer    Types: Snuff  Substance Use Topics  . Alcohol use: No    Frequency: Never    Comment: occasionally  . Drug use: Yes    Types: Marijuana    Comment: daily     Allergies   Morphine and related; Penicillins; Sulfa antibiotics; Tramadol; and Ibuprofen   Review of Systems Review of Systems  All systems reviewed and negative, other than as noted in HPI.  Physical Exam Updated Vital Signs BP 129/88 (BP Location: Left Arm)   Pulse 79   Temp (!) 97.5 F (36.4 C) (Oral)   Resp (!) 24   Wt 111.1 kg (245 lb)   SpO2 98%   BMI 33.23 kg/m   Physical Exam  Constitutional: He appears well-developed and well-nourished. No distress.  HENT:  Head: Normocephalic and atraumatic.  Eyes: Conjunctivae are normal. Right eye exhibits no discharge. Left eye exhibits no discharge.  Neck: Neck supple.  Cardiovascular: Normal rate, regular rhythm and normal heart sounds. Exam reveals no gallop and no friction rub.  No murmur heard. Pulmonary/Chest: Effort normal. No respiratory distress. He has wheezes.  Abdominal: Soft. He exhibits no distension. There is no tenderness.  Musculoskeletal: He exhibits no tenderness.  Lower extremities symmetric as compared to each other. No calf tenderness. Negative Homan's. No palpable cords.   Neurological: He is alert.  Skin: Skin is warm and dry.  Psychiatric: He has a normal mood and affect. His behavior is normal. Thought content normal.  Nursing note and vitals reviewed.    ED Treatments / Results  Labs (all labs ordered are listed, but only abnormal results are displayed) Labs Reviewed - No data to display  EKG None  Radiology Dg Chest 2 View  Result Date: 10/10/2017 CLINICAL DATA:  37 year old male with cough and shortness of breath this morning. Smoker. EXAM: CHEST - 2 VIEW COMPARISON:  07/13/2017 chest radiographs and earlier. FINDINGS: Lung volumes are stable at the upper  limits of normal. Mediastinal contours remain normal. Visualized tracheal air column is within normal limits. No pneumothorax, pulmonary edema, pleural effusion or acute pulmonary opacity. Mild increased interstitial markings diffusely are likely related to smoking and stable. No acute osseous abnormality identified. Negative visible bowel gas pattern. IMPRESSION: No acute cardiopulmonary abnormality. Mild interstitial prominence likely related to smoking. Electronically Signed   By: Genevie Ann M.D.   On: 10/10/2017 09:12    Procedures Procedures (including critical care time)  Medications Ordered in ED Medications  albuterol (PROVENTIL) (2.5 MG/3ML) 0.083% nebulizer solution 5 mg (5 mg Nebulization Given 10/10/17 0826)  ipratropium-albuterol (DUONEB) 0.5-2.5 (3) MG/3ML nebulizer solution 3 mL (3 mLs Nebulization Given 10/10/17 0826)  dexamethasone (DECADRON) tablet 12 mg (12 mg  Oral Given 10/10/17 0751)     Initial Impression / Assessment and Plan / ED Course  I have reviewed the triage vital signs and the nursing notes.  Pertinent labs & imaging results that were available during my care of the patient were reviewed by me and considered in my medical decision making (see chart for details).    37 year old male with dyspnea.  Wheezing on exam.  Possible COPD exacerbation.  Symptoms improved with nebs.  EKG with no overt ischemic changes.  Symptoms seem atypical for ACS.  Doubt heart failure.  9:30 AM On recheck, patient is feeling better.  He still has some mild wheezing, but improved from my initial evaluation.  Denies air movement is better.  She subjectively says he feels much better.  Oxygen saturations are good on room air.  He states that he is out of his home medications.  He will be provided with an albuterol inhaler in the emergency room.  Continue steroids for a few more days.  Final Clinical Impressions(s) / ED Diagnoses   Final diagnoses:  COPD exacerbation [facility])    ED Discharge  Orders    None       Virgel Manifold, MD 10/10/17 [phone]

## 2017-10-10 NOTE — ED Notes (Signed)
Have paged rspiratory

## 2017-10-16 DIAGNOSIS — G40909 Epilepsy, unspecified, not intractable, without status epilepticus: Secondary | ICD-10-CM | POA: Diagnosis not present

## 2017-10-17 ENCOUNTER — Encounter (HOSPITAL_COMMUNITY): Payer: Self-pay

## 2017-10-17 ENCOUNTER — Other Ambulatory Visit: Payer: Self-pay

## 2017-10-17 ENCOUNTER — Emergency Department (HOSPITAL_COMMUNITY)
Admission: EM | Admit: 2017-10-17 | Discharge: 2017-10-17 | Disposition: A | Discharge status: Home / Self Care | Payer: Medicare Other | Attending: Emergency Medicine | Admitting: Emergency Medicine

## 2017-10-17 DIAGNOSIS — J45909 Unspecified asthma, uncomplicated: Secondary | ICD-10-CM | POA: Insufficient documentation

## 2017-10-17 DIAGNOSIS — I10 Essential (primary) hypertension: Secondary | ICD-10-CM | POA: Insufficient documentation

## 2017-10-17 DIAGNOSIS — F17210 Nicotine dependence, cigarettes, uncomplicated: Secondary | ICD-10-CM | POA: Insufficient documentation

## 2017-10-17 DIAGNOSIS — I2510 Atherosclerotic heart disease of native coronary artery without angina pectoris: Secondary | ICD-10-CM | POA: Diagnosis not present

## 2017-10-17 DIAGNOSIS — R58 Hemorrhage, not elsewhere classified: Secondary | ICD-10-CM | POA: Diagnosis not present

## 2017-10-17 DIAGNOSIS — Z9049 Acquired absence of other specified parts of digestive tract: Secondary | ICD-10-CM | POA: Diagnosis not present

## 2017-10-17 DIAGNOSIS — G40909 Epilepsy, unspecified, not intractable, without status epilepticus: Secondary | ICD-10-CM | POA: Diagnosis not present

## 2017-10-17 DIAGNOSIS — I252 Old myocardial infarction: Secondary | ICD-10-CM | POA: Insufficient documentation

## 2017-10-17 DIAGNOSIS — R569 Unspecified convulsions: Secondary | ICD-10-CM | POA: Diagnosis not present

## 2017-10-17 DIAGNOSIS — Z79899 Other long term (current) drug therapy: Secondary | ICD-10-CM | POA: Diagnosis not present

## 2017-10-17 DIAGNOSIS — F1210 Cannabis abuse, uncomplicated: Secondary | ICD-10-CM | POA: Insufficient documentation

## 2017-10-17 DIAGNOSIS — E119 Type 2 diabetes mellitus without complications: Secondary | ICD-10-CM | POA: Diagnosis not present

## 2017-10-17 LAB — BASIC METABOLIC PANEL
ANION GAP: 5 (ref 5–15)
BUN: 9 mg/dL (ref 6–20)
CO2: 28 mmol/L (ref 22–32)
Calcium: 8.6 mg/dL — ABNORMAL LOW (ref 8.9–10.3)
Chloride: 107 mmol/L (ref 101–111)
Creatinine, Ser: 0.89 mg/dL (ref 0.61–1.24)
Glucose, Bld: 90 mg/dL (ref 65–99)
Potassium: 3.5 mmol/L (ref 3.5–5.1)
Sodium: 140 mmol/L (ref 135–145)

## 2017-10-17 LAB — CBC WITH DIFFERENTIAL/PLATELET
BASOS ABS: 0 10*3/uL (ref 0.0–0.1)
BASOS PCT: 0 %
Eosinophils Absolute: 0.4 10*3/uL (ref 0.0–0.7)
Eosinophils Relative: 3 %
HEMATOCRIT: 45.1 % (ref 39.0–52.0)
HEMOGLOBIN: 15.2 g/dL (ref 13.0–17.0)
Lymphocytes Relative: 44 %
Lymphs Abs: 5.4 10*3/uL — ABNORMAL HIGH (ref 0.7–4.0)
MCH: 31 pg (ref 26.0–34.0)
MCHC: 33.7 g/dL (ref 30.0–36.0)
MCV: 91.9 fL (ref 78.0–100.0)
Monocytes Absolute: 0.8 10*3/uL (ref 0.1–1.0)
Monocytes Relative: 6 %
NEUTROS ABS: 5.6 10*3/uL (ref 1.7–7.7)
NEUTROS PCT: 47 %
Platelets: 250 10*3/uL (ref 150–400)
RBC: 4.91 MIL/uL (ref 4.22–5.81)
RDW: 13.2 % (ref 11.5–15.5)
WBC: 12.2 10*3/uL — ABNORMAL HIGH (ref 4.0–10.5)

## 2017-10-17 MED ORDER — PHENYTOIN SODIUM EXTENDED 100 MG PO CAPS: 300.0000 mg | ORAL_CAPSULE | Freq: Every day | ORAL | 0 refills | Status: DC

## 2017-10-17 MED ORDER — PHENYTOIN SODIUM 50 MG/ML IJ SOLN
INTRAMUSCULAR | Status: AC
  Filled 2017-10-17: qty 20

## 2017-10-17 MED ORDER — PHENYTOIN SODIUM 50 MG/ML IJ SOLN
1000.0000 mg | Freq: Once | INTRAMUSCULAR | Status: AC
  Administered 2017-10-17: 1000 mg via INTRAVENOUS
  Filled 2017-10-17: qty 20

## 2017-10-17 NOTE — ED Triage Notes (Signed)
Pt in by [facility] for seizures that started earlier tonight while at a friends house.  Pt states he has been out of his dilantin for several months due to loss of insurance.  Pt denies pain or injury

## 2017-10-17 NOTE — ED Provider Notes (Signed)
[facility] EMERGENCY DEPARTMENT Provider Note   CSN: [id number] Arrival date & time: 10/17/17  0122     History   Chief Complaint Chief Complaint  Patient presents with  . Seizures    HPI Chad Hampton is a 37 y.o. male.  Patient is a 37 year old male with past medical history of seizures, asthma, COPD.  He is brought by EMS after experiencing a seizure.  He was at a friend's house when he developed generalized shaking and loss of consciousness.  He denies any bowel or bladder incontinence.  He has not been taking his Dilantin for several months due to loss of insurance and he cannot afford it.  The history is provided by the patient.  Seizures   This is a recurrent problem. The current episode started less than 1 hour ago. The problem has been resolved. There was 1 seizure. The most recent episode lasted 30 to 120 seconds. Characteristics include rhythmic jerking and loss of consciousness. Characteristics do not include bowel incontinence or bladder incontinence. The episode was witnessed. The seizure(s) had no focality. Possible causes include missed seizure meds. There has been no fever.    Past Medical History:  Diagnosis Date  . Acute angina ([place])   . Arthritis   . Asthma   . CAD (coronary artery disease)    a. nonobstructive CAD by cath in 01/2017  . COPD (chronic obstructive pulmonary disease) ([place])   . Dental caries   . Diabetes mellitus without complication ([place])   . GERD (gastroesophageal reflux disease)   . Headache   . Hypertension   . Myocardial infarction ([place])   . Seizure disorder ([place])   . Seizures [facility])     Patient Active Problem List   Diagnosis Date Noted  . DM coma, type 2, not at goal [facility]) 06/24/2017  . Seizure ([place]) 06/24/2017  . Abnormal cardiovascular stress test   . MDD (major depressive disorder), recurrent severe, without psychosis ([place]) 05/11/2015  . Cannabis abuse 05/11/2015  . Substance induced mood disorder ([place]) 05/11/2015     Past Surgical History:  Procedure Laterality Date  . CHOLECYSTECTOMY    . EYE SURGERY    . FACIAL RECONSTRUCTION SURGERY    . LEFT HEART CATH AND CORONARY ANGIOGRAPHY N/A 02/07/2017   Procedure: LEFT HEART CATH AND CORONARY ANGIOGRAPHY;  Surgeon: Jettie Booze, MD;  Location: [place] [facility];  Service: Cardiovascular;  Laterality: N/A;  . MULTIPLE EXTRACTIONS WITH ALVEOLOPLASTY N/A 06/06/2017   Procedure: MULTIPLE EXTRACTIONS OF TEETH NUMBERS ONE, SEVENTEEN, EIGHTEEN, NINETEEN, TWENTY-THREE, TWENTY-FOUR, TWENTY-FIVE, TWENTY-SIX.;  Surgeon: Diona Browner, DDS;  Location: [place];  Service: Oral Surgery;  Laterality: N/A;        Home Medications    Prior to Admission medications   Medication Sig Start Date End Date Taking? Authorizing Provider  albuterol (PROVENTIL) (2.5 MG/3ML) 0.083% nebulizer solution Take 3 mLs (2.5 mg total) by nebulization every 6 (six) hours as needed for wheezing or shortness of breath. 07/13/17   Nat Christen, MD  antipyrine-benzocaine Toniann Fail) OTIC solution Place 3-4 drops into the right ear every 2 (two) hours as needed for ear pain. Compounded solution of 1% hydrocortisone and 2% benzocaine compounded solution. 08/04/17   Evalee Jefferson, PA-C  atorvastatin (LIPITOR) 40 MG tablet Take 1 tablet (40 mg total) by mouth daily. 06/24/17 12/21/17  Erma Heritage, PA-C  chlorpheniramine-HYDROcodone (TUSSIONEX PENNKINETIC ER) 10-8 MG/5ML SUER Take 5 mLs by mouth every 12 (twelve) hours as needed for cough. Patient not taking:  Reported on 08/04/2017 07/13/17   Nat Christen, MD  predniSONE (DELTASONE) 20 MG tablet Take 2 tablets (40 mg total) by mouth daily. 10/10/17   Virgel Manifold, MD  ranitidine (ZANTAC) 150 MG tablet Take 1 tablet (150 mg total) by mouth 2 (two) times daily. 06/24/17   Erma Heritage, PA-C    Family History Family History  Problem Relation Age of Onset  . Diabetes Mother   . Hypertension Mother   . Heart failure Father   . Stroke Father    . Diabetes Father   . Heart failure Brother     Social History Social History   Tobacco Use  . Smoking status: Current Every Day Smoker    Packs/day: 0.50    Years: 20.00    Pack years: 10.00    Types: Cigarettes  . Smokeless tobacco: Former Systems developer    Types: Snuff  Substance Use Topics  . Alcohol use: No    Frequency: Never    Comment: occasionally  . Drug use: Yes    Types: Marijuana    Comment: daily     Allergies   Morphine and related; Penicillins; Sulfa antibiotics; Tramadol; and Ibuprofen   Review of Systems Review of Systems  Gastrointestinal: Negative for bowel incontinence.  Genitourinary: Negative for bladder incontinence.  Neurological: Positive for seizures and loss of consciousness.  All other systems reviewed and are negative.    Physical Exam Updated Vital Signs BP (!) 153/92   Pulse 85   Temp 98.1 F (36.7 C)   Resp 16   Ht 6' (1.829 m)   Wt 113.4 kg (250 lb)   SpO2 95%   BMI 33.91 kg/m   Physical Exam  Constitutional: He is oriented to person, place, and time. He appears well-developed and well-nourished. No distress.  HENT:  Head: Normocephalic and atraumatic.  Mouth/Throat: Oropharynx is clear and moist.  Eyes: Pupils are equal, round, and reactive to light. EOM are normal.  Neck: Normal range of motion. Neck supple.  Cardiovascular: Normal rate and regular rhythm. Exam reveals no friction rub.  No murmur heard. Pulmonary/Chest: Effort normal and breath sounds normal. No respiratory distress. He has no wheezes. He has no rales.  Abdominal: Soft. Bowel sounds are normal. He exhibits no distension. There is no tenderness.  Musculoskeletal: Normal range of motion. He exhibits no edema.  Neurological: He is alert and oriented to person, place, and time. No cranial nerve deficit. He exhibits normal muscle tone. Coordination normal.  Skin: Skin is warm and dry. He is not diaphoretic.  Nursing note and vitals reviewed.    ED Treatments  / Results  Labs (all labs ordered are listed, but only abnormal results are displayed) Labs Reviewed  BASIC METABOLIC PANEL  CBC WITH DIFFERENTIAL/PLATELET    EKG None  Radiology No results found.  Procedures Procedures (including critical care time)  Medications Ordered in ED Medications  phenytoin (DILANTIN) 1,000 mg in sodium chloride 0.9 % 250 mL IVPB (has no administration in time range)     Initial Impression / Assessment and Plan / ED Course  I have reviewed the triage vital signs and the nursing notes.  Pertinent labs & imaging results that were available during my care of the patient were reviewed by me and considered in my medical decision making (see chart for details).  Patient brought by EMS after experiencing a seizure.  He is supposed to be on Dilantin, however he has not been on this for several months for financial/insurance reasons.  He is neurologically intact and his seizure activity has resolved.  He was given intravenous Dilantin and will be discharged with a prescription for this.  He is to follow-up with his primary doctor.  Final Clinical Impressions(s) / ED Diagnoses   Final diagnoses:  None    ED Discharge Orders    None       Veryl Speak, MD 10/17/17 [phone]

## 2017-10-17 NOTE — ED Notes (Signed)
Chad Hampton, [facility] notified of need for Dilantin IVPB

## 2017-10-17 NOTE — Discharge Instructions (Addendum)
Resume taking your Dilantin as prescribed.  Follow-up with your primary doctor in the next week, and return to the ER if symptoms significantly worsen or change.

## 2017-10-25 ENCOUNTER — Emergency Department (HOSPITAL_COMMUNITY)
Admission: EM | Admit: 2017-10-25 | Discharge: 2017-10-25 | Disposition: A | Discharge status: Home / Self Care | Payer: Medicare Other | Attending: Emergency Medicine | Admitting: Emergency Medicine

## 2017-10-25 ENCOUNTER — Encounter (HOSPITAL_COMMUNITY): Payer: Self-pay

## 2017-10-25 DIAGNOSIS — J45909 Unspecified asthma, uncomplicated: Secondary | ICD-10-CM | POA: Diagnosis not present

## 2017-10-25 DIAGNOSIS — E119 Type 2 diabetes mellitus without complications: Secondary | ICD-10-CM | POA: Diagnosis not present

## 2017-10-25 DIAGNOSIS — I2510 Atherosclerotic heart disease of native coronary artery without angina pectoris: Secondary | ICD-10-CM | POA: Insufficient documentation

## 2017-10-25 DIAGNOSIS — R569 Unspecified convulsions: Secondary | ICD-10-CM | POA: Diagnosis not present

## 2017-10-25 DIAGNOSIS — F17210 Nicotine dependence, cigarettes, uncomplicated: Secondary | ICD-10-CM | POA: Insufficient documentation

## 2017-10-25 DIAGNOSIS — G40909 Epilepsy, unspecified, not intractable, without status epilepticus: Secondary | ICD-10-CM | POA: Diagnosis not present

## 2017-10-25 DIAGNOSIS — I10 Essential (primary) hypertension: Secondary | ICD-10-CM | POA: Insufficient documentation

## 2017-10-25 DIAGNOSIS — Z79899 Other long term (current) drug therapy: Secondary | ICD-10-CM | POA: Insufficient documentation

## 2017-10-25 MED ORDER — PHENYTOIN SODIUM EXTENDED 100 MG PO CAPS: 100.0000 mg | ORAL_CAPSULE | Freq: Three times a day (TID) | ORAL | 3 refills | Status: DC

## 2017-10-25 MED ORDER — PHENYTOIN SODIUM EXTENDED 100 MG PO CAPS
300.0000 mg | ORAL_CAPSULE | Freq: Once | ORAL | Status: AC
  Administered 2017-10-25: 300 mg via ORAL
  Filled 2017-10-25: qty 3

## 2017-10-25 MED ORDER — LORAZEPAM 1 MG PO TABS
1.0000 mg | ORAL_TABLET | Freq: Once | ORAL | Status: AC
  Administered 2017-10-25: 1 mg via ORAL
  Filled 2017-10-25: qty 1

## 2017-10-25 NOTE — ED Triage Notes (Signed)
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  Pt reports pain across his jaw, states he feels like he did have a seizure.

## 2017-10-25 NOTE — ED Provider Notes (Signed)
[facility] EMERGENCY DEPARTMENT Provider Note   CSN: [id number] Arrival date & time: 10/25/17  0155     History   Chief Complaint Chief Complaint  Patient presents with  . Seizures    HPI Chad Hampton is a 37 y.o. male.  Patient brought to the ER by significant other for evaluation of seizure.  Significant other reports that she called him and he did not sound right on the phone.  She raised home and found him lying in the grass.  He was by himself and unwitnessed, but he reports that he feels like he normally does after a seizure.  He has not been taking his Dilantin.  He reports that he simply does not like taking pills.     Past Medical History:  Diagnosis Date  . Acute angina ([place])   . Arthritis   . Asthma   . CAD (coronary artery disease)    a. nonobstructive CAD by cath in 01/2017  . COPD (chronic obstructive pulmonary disease) ([place])   . Dental caries   . Diabetes mellitus without complication ([place])   . GERD (gastroesophageal reflux disease)   . Headache   . Hypertension   . Myocardial infarction ([place])   . Seizure disorder ([place])   . Seizures [facility])     Patient Active Problem List   Diagnosis Date Noted  . DM coma, type 2, not at goal [facility]) 06/24/2017  . Seizure ([place]) 06/24/2017  . Abnormal cardiovascular stress test   . MDD (major depressive disorder), recurrent severe, without psychosis ([place]) 05/11/2015  . Cannabis abuse 05/11/2015  . Substance induced mood disorder ([place]) 05/11/2015    Past Surgical History:  Procedure Laterality Date  . CHOLECYSTECTOMY    . EYE SURGERY    . FACIAL RECONSTRUCTION SURGERY    . LEFT HEART CATH AND CORONARY ANGIOGRAPHY N/A 02/07/2017   Procedure: LEFT HEART CATH AND CORONARY ANGIOGRAPHY;  Surgeon: Jettie Booze, MD;  Location: [place] CV [facility];  Service: Cardiovascular;  Laterality: N/A;  . MULTIPLE EXTRACTIONS WITH ALVEOLOPLASTY N/A 06/06/2017   Procedure: MULTIPLE EXTRACTIONS OF TEETH NUMBERS ONE, SEVENTEEN,  EIGHTEEN, NINETEEN, TWENTY-THREE, TWENTY-FOUR, TWENTY-FIVE, TWENTY-SIX.;  Surgeon: Diona Browner, DDS;  Location: [place];  Service: Oral Surgery;  Laterality: N/A;        Home Medications    Prior to Admission medications   Medication Sig Start Date End Date Taking? Authorizing Provider  albuterol (PROVENTIL) (2.5 MG/3ML) 0.083% nebulizer solution Take 3 mLs (2.5 mg total) by nebulization every 6 (six) hours as needed for wheezing or shortness of breath. 07/13/17   Nat Christen, MD  antipyrine-benzocaine Toniann Fail) OTIC solution Place 3-4 drops into the right ear every 2 (two) hours as needed for ear pain. Compounded solution of 1% hydrocortisone and 2% benzocaine compounded solution. 08/04/17   Evalee Jefferson, PA-C  atorvastatin (LIPITOR) 40 MG tablet Take 1 tablet (40 mg total) by mouth daily. 06/24/17 12/21/17  Erma Heritage, PA-C  chlorpheniramine-HYDROcodone (TUSSIONEX PENNKINETIC ER) 10-8 MG/5ML SUER Take 5 mLs by mouth every 12 (twelve) hours as needed for cough. Patient not taking: Reported on 08/04/2017 07/13/17   Nat Christen, MD  phenytoin (DILANTIN) 100 MG ER capsule Take 1 capsule (100 mg total) by mouth 3 (three) times daily. 10/25/17   Orpah Greek, MD  predniSONE (DELTASONE) 20 MG tablet Take 2 tablets (40 mg total) by mouth daily. 10/10/17   Virgel Manifold, MD  ranitidine (ZANTAC) 150 MG tablet Take 1 tablet (150 mg total) by  mouth 2 (two) times daily. 06/24/17   Erma Heritage, PA-C    Family History Family History  Problem Relation Age of Onset  . Diabetes Mother   . Hypertension Mother   . Heart failure Father   . Stroke Father   . Diabetes Father   . Heart failure Brother     Social History Social History   Tobacco Use  . Smoking status: Current Every Day Smoker    Packs/day: 0.50    Years: 20.00    Pack years: 10.00    Types: Cigarettes  . Smokeless tobacco: Former Systems developer    Types: Snuff  Substance Use Topics  . Alcohol use: No    Frequency: Never      Comment: occasionally  . Drug use: Yes    Types: Marijuana    Comment: daily     Allergies   Morphine and related; Penicillins; Sulfa antibiotics; Tramadol; and Ibuprofen   Review of Systems Review of Systems  Neurological: Positive for seizures.  All other systems reviewed and are negative.    Physical Exam Updated Vital Signs BP 123/84 (BP Location: Right Arm)   Pulse 95   Temp 97.9 F (36.6 C) (Oral)   Resp 16   Ht 6' (1.829 m)   Wt 113.4 kg (250 lb)   SpO2 97%   BMI 33.91 kg/m   Physical Exam  Constitutional: He is oriented to person, place, and time. He appears well-developed and well-nourished. No distress.  HENT:  Head: Normocephalic and atraumatic.  Right Ear: Hearing normal.  Left Ear: Hearing normal.  Nose: Nose normal.  Mouth/Throat: Oropharynx is clear and moist and mucous membranes are normal.  Eyes: Pupils are equal, round, and reactive to light. Conjunctivae and EOM are normal.  Neck: Normal range of motion. Neck supple.  Cardiovascular: Regular rhythm, S1 normal and S2 normal. Exam reveals no gallop and no friction rub.  No murmur heard. Pulmonary/Chest: Effort normal and breath sounds normal. No respiratory distress. He exhibits no tenderness.  Abdominal: Soft. Normal appearance and bowel sounds are normal. There is no hepatosplenomegaly. There is no tenderness. There is no rebound, no guarding, no tenderness at McBurney's point and negative Murphy's sign. No hernia.  Musculoskeletal: Normal range of motion.  Neurological: He is alert and oriented to person, place, and time. He has normal strength. No cranial nerve deficit or sensory deficit. Coordination normal. GCS eye subscore is 4. GCS verbal subscore is 5. GCS motor subscore is 6.  Skin: Skin is warm, dry and intact. No rash noted. No cyanosis.  Psychiatric: He has a normal mood and affect. His speech is normal and behavior is normal. Thought content normal.  Nursing note and vitals  reviewed.    ED Treatments / Results  Labs (all labs ordered are listed, but only abnormal results are displayed) Labs Reviewed - No data to display  EKG None  Radiology No results found.  Procedures Procedures (including critical care time)  Medications Ordered in ED Medications  LORazepam (ATIVAN) tablet 1 mg (has no administration in time range)  phenytoin (DILANTIN) ER capsule 300 mg (has no administration in time range)     Initial Impression / Assessment and Plan / ED Course  I have reviewed the triage vital signs and the nursing notes.  Pertinent labs & imaging results that were available during my care of the patient were reviewed by me and considered in my medical decision making (see chart for details).     Patient counseled  that he will have seizures if he does not take his medicine.  This can be fatal.  He has to start taking his Dilantin, was given a prescription.  Patient has been extremely noncompliant in the past, I see no reason to do an IV Dilantin load at this time.  We will give him oral Ativan and Dilantin prior to discharge.  Final Clinical Impressions(s) / ED Diagnoses   Final diagnoses:  Seizure [facility])    ED Discharge Orders        Ordered    phenytoin (DILANTIN) 100 MG ER capsule  3 times daily     10/25/17 0221       Orpah Greek, MD 10/25/17 0222

## 2017-11-16 ENCOUNTER — Emergency Department (HOSPITAL_COMMUNITY): Payer: Medicare Other

## 2017-11-16 ENCOUNTER — Encounter (HOSPITAL_COMMUNITY): Payer: Self-pay | Admitting: *Deleted

## 2017-11-16 ENCOUNTER — Other Ambulatory Visit: Payer: Self-pay

## 2017-11-16 DIAGNOSIS — I2510 Atherosclerotic heart disease of native coronary artery without angina pectoris: Secondary | ICD-10-CM | POA: Diagnosis not present

## 2017-11-16 DIAGNOSIS — M25572 Pain in left ankle and joints of left foot: Secondary | ICD-10-CM | POA: Diagnosis not present

## 2017-11-16 DIAGNOSIS — Z79899 Other long term (current) drug therapy: Secondary | ICD-10-CM | POA: Diagnosis not present

## 2017-11-16 DIAGNOSIS — I10 Essential (primary) hypertension: Secondary | ICD-10-CM | POA: Insufficient documentation

## 2017-11-16 DIAGNOSIS — E119 Type 2 diabetes mellitus without complications: Secondary | ICD-10-CM | POA: Diagnosis not present

## 2017-11-16 DIAGNOSIS — J449 Chronic obstructive pulmonary disease, unspecified: Secondary | ICD-10-CM | POA: Diagnosis not present

## 2017-11-16 DIAGNOSIS — F17210 Nicotine dependence, cigarettes, uncomplicated: Secondary | ICD-10-CM | POA: Insufficient documentation

## 2017-11-16 NOTE — ED Triage Notes (Signed)
Pt c/o left ankle pain that started today, pt states that he thinks he may have gotten his foot stuck in the bed rail,

## 2017-11-17 ENCOUNTER — Emergency Department (HOSPITAL_COMMUNITY)
Admission: EM | Admit: 2017-11-17 | Discharge: 2017-11-17 | Disposition: A | Discharge status: Home / Self Care | Payer: Medicare Other | Attending: Emergency Medicine | Admitting: Emergency Medicine

## 2017-11-17 DIAGNOSIS — M25572 Pain in left ankle and joints of left foot: Secondary | ICD-10-CM

## 2017-11-17 MED ORDER — PREDNISONE 10 MG PO TABS: 20.0000 mg | ORAL_TABLET | Freq: Two times a day (BID) | ORAL | 0 refills | Status: DC

## 2017-11-17 NOTE — ED Provider Notes (Signed)
[facility] EMERGENCY DEPARTMENT Provider Note   CSN: [id number] Arrival date & time: 11/16/17  2242     History   Chief Complaint Chief Complaint  Patient presents with  . Ankle Pain    HPI Chad Hampton is a 37 y.o. male.  Patient is a 37 year old male with complaints of left ankle pain that started earlier this morning.  He denies any specific injury or trauma.  He has pain while walking.  He denies any fevers, chills, or redness.  The history is provided by the patient.  Ankle Pain   Incident onset: This morning. There was no injury mechanism. The pain is present in the left ankle. The quality of the pain is described as throbbing. The pain is moderate. The pain has been constant since onset. Pertinent negatives include no numbness, no inability to bear weight and no tingling. The symptoms are aggravated by activity and bearing weight. He has tried nothing for the symptoms.    Past Medical History:  Diagnosis Date  . Acute angina ([place])   . Arthritis   . Asthma   . CAD (coronary artery disease)    a. nonobstructive CAD by cath in 01/2017  . COPD (chronic obstructive pulmonary disease) ([place])   . Dental caries   . Diabetes mellitus without complication ([place])   . GERD (gastroesophageal reflux disease)   . Headache   . Hypertension   . Myocardial infarction ([place])   . Seizure disorder ([place])   . Seizures [facility])     Patient Active Problem List   Diagnosis Date Noted  . DM coma, type 2, not at goal [facility]) 06/24/2017  . Seizure ([place]) 06/24/2017  . Abnormal cardiovascular stress test   . MDD (major depressive disorder), recurrent severe, without psychosis ([place]) 05/11/2015  . Cannabis abuse 05/11/2015  . Substance induced mood disorder ([place]) 05/11/2015    Past Surgical History:  Procedure Laterality Date  . CHOLECYSTECTOMY    . EYE SURGERY    . FACIAL RECONSTRUCTION SURGERY    . LEFT HEART CATH AND CORONARY ANGIOGRAPHY N/A 02/07/2017   Procedure: LEFT HEART CATH AND  CORONARY ANGIOGRAPHY;  Surgeon: Jettie Booze, MD;  Location: [place] [facility];  Service: Cardiovascular;  Laterality: N/A;  . MULTIPLE EXTRACTIONS WITH ALVEOLOPLASTY N/A 06/06/2017   Procedure: MULTIPLE EXTRACTIONS OF TEETH NUMBERS ONE, SEVENTEEN, EIGHTEEN, NINETEEN, TWENTY-THREE, TWENTY-FOUR, TWENTY-FIVE, TWENTY-SIX.;  Surgeon: Diona Browner, DDS;  Location: [place];  Service: Oral Surgery;  Laterality: N/A;        Home Medications    Prior to Admission medications   Medication Sig Start Date End Date Taking? Authorizing Provider  albuterol (PROVENTIL) (2.5 MG/3ML) 0.083% nebulizer solution Take 3 mLs (2.5 mg total) by nebulization every 6 (six) hours as needed for wheezing or shortness of breath. 07/13/17   Nat Christen, MD  antipyrine-benzocaine Toniann Fail) OTIC solution Place 3-4 drops into the right ear every 2 (two) hours as needed for ear pain. Compounded solution of 1% hydrocortisone and 2% benzocaine compounded solution. 08/04/17   Evalee Jefferson, PA-C  atorvastatin (LIPITOR) 40 MG tablet Take 1 tablet (40 mg total) by mouth daily. 06/24/17 12/21/17  Erma Heritage, PA-C  chlorpheniramine-HYDROcodone (TUSSIONEX PENNKINETIC ER) 10-8 MG/5ML SUER Take 5 mLs by mouth every 12 (twelve) hours as needed for cough. Patient not taking: Reported on 08/04/2017 07/13/17   Nat Christen, MD  phenytoin (DILANTIN) 100 MG ER capsule Take 1 capsule (100 mg total) by mouth 3 (three) times daily. 10/25/17  Orpah Greek, MD  predniSONE (DELTASONE) 20 MG tablet Take 2 tablets (40 mg total) by mouth daily. 10/10/17   Virgel Manifold, MD  ranitidine (ZANTAC) 150 MG tablet Take 1 tablet (150 mg total) by mouth 2 (two) times daily. 06/24/17   Erma Heritage, PA-C    Family History Family History  Problem Relation Age of Onset  . Diabetes Mother   . Hypertension Mother   . Heart failure Father   . Stroke Father   . Diabetes Father   . Heart failure Brother     Social History Social History     Tobacco Use  . Smoking status: Current Every Day Smoker    Packs/day: 0.50    Years: 20.00    Pack years: 10.00    Types: Cigarettes  . Smokeless tobacco: Former Systems developer    Types: Snuff  Substance Use Topics  . Alcohol use: No    Frequency: Never    Comment: occasionally  . Drug use: Yes    Types: Marijuana    Comment: daily     Allergies   Morphine and related; Penicillins; Sulfa antibiotics; Tramadol; and Ibuprofen   Review of Systems Review of Systems  Neurological: Negative for tingling and numbness.  All other systems reviewed and are negative.    Physical Exam Updated Vital Signs BP 123/81 (BP Location: Right Arm)   Pulse 91   Temp 98.3 F (36.8 C) (Oral)   Resp 18   Ht 6' (1.829 m)   Wt 113.4 kg (250 lb)   SpO2 98%   BMI 33.91 kg/m   Physical Exam  Constitutional: He is oriented to person, place, and time. He appears well-developed and well-nourished. No distress.  HENT:  Head: Normocephalic and atraumatic.  Neck: Normal range of motion. Neck supple.  Pulmonary/Chest: Effort normal.  Musculoskeletal:  The left ankle appears grossly normal.  There is mild swelling to the lateral malleolus.  He is tender in this area.  The ankle joint appears stable.  He has good range of motion, but does have discomfort.  There is no warmth or erythema.  Neurological: He is alert and oriented to person, place, and time.  Skin: He is not diaphoretic.  Nursing note and vitals reviewed.    ED Treatments / Results  Labs (all labs ordered are listed, but only abnormal results are displayed) Labs Reviewed - No data to display  EKG None  Radiology Dg Ankle Complete Left  Result Date: 11/16/2017 CLINICAL DATA:  Ankle pain EXAM: LEFT ANKLE COMPLETE - 3+ VIEW COMPARISON:  None. FINDINGS: There is no evidence of fracture, dislocation, or joint effusion. There is no evidence of arthropathy or other focal bone abnormality. Soft tissues are unremarkable. IMPRESSION:  Negative. Electronically Signed   By: Donavan Foil M.D.   On: 11/16/2017 23:45    Procedures Procedures (including critical care time)  Medications Ordered in ED Medications - No data to display   Initial Impression / Assessment and Plan / ED Course  I have reviewed the triage vital signs and the nursing notes.  Pertinent labs & imaging results that were available during my care of the patient were reviewed by me and considered in my medical decision making (see chart for details).  Patient with atraumatic left ankle pain.  His x-rays are negative.  I suspect a musculoskeletal etiology.  I have considered, but highly doubt a septic joint.  There is no warmth or erythema.  I have also considered gout, however he  has never had this before.  He will be treated with anti-inflammatory medications, rest, and return if his symptoms worsen.  Final Clinical Impressions(s) / ED Diagnoses   Final diagnoses:  None    ED Discharge Orders    None       Veryl Speak, MD 11/17/17 [phone]

## 2017-11-17 NOTE — Discharge Instructions (Signed)
Prednisone as prescribed.  Tylenol 1000 mg every 6 hours as needed for pain.  Follow-up with your primary doctor if symptoms are not improving in the next week.

## 2017-11-23 ENCOUNTER — Other Ambulatory Visit: Payer: Self-pay

## 2017-11-23 ENCOUNTER — Encounter (HOSPITAL_COMMUNITY): Payer: Self-pay | Admitting: *Deleted

## 2017-11-23 ENCOUNTER — Emergency Department (HOSPITAL_COMMUNITY)
Admission: EM | Admit: 2017-11-23 | Discharge: 2017-11-23 | Disposition: A | Discharge status: Home / Self Care | Payer: Medicare Other | Attending: Emergency Medicine | Admitting: Emergency Medicine

## 2017-11-23 DIAGNOSIS — E119 Type 2 diabetes mellitus without complications: Secondary | ICD-10-CM | POA: Diagnosis not present

## 2017-11-23 DIAGNOSIS — I252 Old myocardial infarction: Secondary | ICD-10-CM | POA: Diagnosis not present

## 2017-11-23 DIAGNOSIS — I2510 Atherosclerotic heart disease of native coronary artery without angina pectoris: Secondary | ICD-10-CM | POA: Diagnosis not present

## 2017-11-23 DIAGNOSIS — M25572 Pain in left ankle and joints of left foot: Secondary | ICD-10-CM

## 2017-11-23 DIAGNOSIS — J449 Chronic obstructive pulmonary disease, unspecified: Secondary | ICD-10-CM | POA: Diagnosis not present

## 2017-11-23 DIAGNOSIS — F1210 Cannabis abuse, uncomplicated: Secondary | ICD-10-CM | POA: Diagnosis not present

## 2017-11-23 DIAGNOSIS — F17210 Nicotine dependence, cigarettes, uncomplicated: Secondary | ICD-10-CM | POA: Insufficient documentation

## 2017-11-23 DIAGNOSIS — I10 Essential (primary) hypertension: Secondary | ICD-10-CM | POA: Diagnosis not present

## 2017-11-23 DIAGNOSIS — Z79899 Other long term (current) drug therapy: Secondary | ICD-10-CM | POA: Diagnosis not present

## 2017-11-23 LAB — URIC ACID: Uric Acid, Serum: 8 mg/dL (ref 3.7–8.6)

## 2017-11-23 LAB — BASIC METABOLIC PANEL
ANION GAP: 7 (ref 5–15)
BUN: 10 mg/dL (ref 6–20)
CHLORIDE: 107 mmol/L (ref 98–111)
CO2: 27 mmol/L (ref 22–32)
Calcium: 9 mg/dL (ref 8.9–10.3)
Creatinine, Ser: 0.8 mg/dL (ref 0.61–1.24)
GFR calc Af Amer: >60 mL/min (ref >60)
GFR calc non Af Amer: >60 mL/min (ref >60)
GLUCOSE: 102 mg/dL — AB (ref 70–99)
POTASSIUM: 3.5 mmol/L (ref 3.5–5.1)
Sodium: 141 mmol/L (ref 135–145)

## 2017-11-23 LAB — CBC WITH DIFFERENTIAL/PLATELET
Basophils Absolute: 0.1 10*3/uL (ref 0.0–0.1)
Basophils Relative: 0 %
Eosinophils Absolute: 0.4 10*3/uL (ref 0.0–0.7)
Eosinophils Relative: 3 %
HEMATOCRIT: 44.8 % (ref 39.0–52.0)
HEMOGLOBIN: 15.2 g/dL (ref 13.0–17.0)
LYMPHS ABS: 5.4 10*3/uL — AB (ref 0.7–4.0)
LYMPHS PCT: 38 %
MCH: 31 pg (ref 26.0–34.0)
MCHC: 33.9 g/dL (ref 30.0–36.0)
MCV: 91.2 fL (ref 78.0–100.0)
Monocytes Absolute: 0.7 10*3/uL (ref 0.1–1.0)
Monocytes Relative: 5 %
NEUTROS ABS: 7.6 10*3/uL (ref 1.7–7.7)
NEUTROS PCT: 54 %
Platelets: 284 10*3/uL (ref 150–400)
RBC: 4.91 MIL/uL (ref 4.22–5.81)
RDW: 13.6 % (ref 11.5–15.5)
WBC: 14.2 10*3/uL — AB (ref 4.0–10.5)

## 2017-11-23 LAB — SEDIMENTATION RATE: Sed Rate: 14 mm/hr (ref 0–16)

## 2017-11-23 MED ORDER — OXYCODONE-ACETAMINOPHEN 5-325 MG PO TABS
1.0000 | ORAL_TABLET | Freq: Once | ORAL | Status: AC
  Administered 2017-11-23: 1 via ORAL
  Filled 2017-11-23: qty 1

## 2017-11-23 MED ORDER — PREDNISONE 50 MG PO TABS
60.0000 mg | ORAL_TABLET | Freq: Once | ORAL | Status: AC
  Administered 2017-11-23: 60 mg via ORAL
  Filled 2017-11-23: qty 1

## 2017-11-23 MED ORDER — OXYCODONE-ACETAMINOPHEN 5-325 MG PO TABS: 1.0000 | ORAL_TABLET | ORAL | 0 refills | Status: DC | PRN

## 2017-11-23 MED ORDER — PREDNISONE 20 MG PO TABS: 60.0000 mg | ORAL_TABLET | Freq: Every day | ORAL | 0 refills | Status: DC

## 2017-11-23 NOTE — ED Provider Notes (Signed)
[facility] EMERGENCY DEPARTMENT Provider Note   CSN: [id number] Arrival date & time: 11/23/17  8938     History   Chief Complaint Chief Complaint  Patient presents with  . Ankle Pain    HPI Chad Hampton is a 37 y.o. male.  The history is provided by the patient.  Ankle Pain    He has history of hypertension, diabetes, seizures, coronary artery disease, asthma, COPD and comes in with ongoing pain in his left ankle.  He had been seen in the emergency department 5 days ago with left ankle pain and had negative x-rays and was told that he might have gout and was discharged with a short course of prednisone.  He states that pain has not gotten any better.  He has been unable to bear weight on his ankle and has fallen because of that.  Pain is rated at 10/10.  He denies any ankle trauma.  He has not had similar problems in the past.  Past Medical History:  Diagnosis Date  . Acute angina ([place])   . Arthritis   . Asthma   . CAD (coronary artery disease)    a. nonobstructive CAD by cath in 01/2017  . COPD (chronic obstructive pulmonary disease) ([place])   . Dental caries   . Diabetes mellitus without complication ([place])   . GERD (gastroesophageal reflux disease)   . Headache   . Hypertension   . Myocardial infarction ([place])   . Seizure disorder ([place])   . Seizures [facility])     Patient Active Problem List   Diagnosis Date Noted  . DM coma, type 2, not at goal [facility]) 06/24/2017  . Seizure ([place]) 06/24/2017  . Abnormal cardiovascular stress test   . MDD (major depressive disorder), recurrent severe, without psychosis ([place]) 05/11/2015  . Cannabis abuse 05/11/2015  . Substance induced mood disorder ([place]) 05/11/2015    Past Surgical History:  Procedure Laterality Date  . CHOLECYSTECTOMY    . EYE SURGERY    . FACIAL RECONSTRUCTION SURGERY    . LEFT HEART CATH AND CORONARY ANGIOGRAPHY N/A 02/07/2017   Procedure: LEFT HEART CATH AND CORONARY ANGIOGRAPHY;  Surgeon: Jettie Booze,  MD;  Location: [place] CV LAB;  Service: Cardiovascular;  Laterality: N/A;  . MULTIPLE EXTRACTIONS WITH ALVEOLOPLASTY N/A 06/06/2017   Procedure: MULTIPLE EXTRACTIONS OF TEETH NUMBERS ONE, SEVENTEEN, EIGHTEEN, NINETEEN, TWENTY-THREE, TWENTY-FOUR, TWENTY-FIVE, TWENTY-SIX.;  Surgeon: Diona Browner, DDS;  Location: [place];  Service: Oral Surgery;  Laterality: N/A;        Home Medications    Prior to Admission medications   Medication Sig Start Date End Date Taking? Authorizing Provider  albuterol (PROVENTIL) (2.5 MG/3ML) 0.083% nebulizer solution Take 3 mLs (2.5 mg total) by nebulization every 6 (six) hours as needed for wheezing or shortness of breath. 07/13/17   Nat Christen, MD  antipyrine-benzocaine Toniann Fail) OTIC solution Place 3-4 drops into the right ear every 2 (two) hours as needed for ear pain. Compounded solution of 1% hydrocortisone and 2% benzocaine compounded solution. 08/04/17   Evalee Jefferson, PA-C  atorvastatin (LIPITOR) 40 MG tablet Take 1 tablet (40 mg total) by mouth daily. 06/24/17 12/21/17  Erma Heritage, PA-C  chlorpheniramine-HYDROcodone (TUSSIONEX PENNKINETIC ER) 10-8 MG/5ML SUER Take 5 mLs by mouth every 12 (twelve) hours as needed for cough. Patient not taking: Reported on 08/04/2017 07/13/17   Nat Christen, MD  phenytoin (DILANTIN) 100 MG ER capsule Take 1 capsule (100 mg total) by mouth 3 (three) times daily. 10/25/17  Orpah Greek, MD  predniSONE (DELTASONE) 10 MG tablet Take 2 tablets (20 mg total) by mouth 2 (two) times daily with a meal. 11/17/17   Veryl Speak, MD  ranitidine (ZANTAC) 150 MG tablet Take 1 tablet (150 mg total) by mouth 2 (two) times daily. 06/24/17   Erma Heritage, PA-C    Family History Family History  Problem Relation Age of Onset  . Diabetes Mother   . Hypertension Mother   . Heart failure Father   . Stroke Father   . Diabetes Father   . Heart failure Brother     Social History Social History   Tobacco Use  . Smoking  status: Current Every Day Smoker    Packs/day: 0.50    Years: 20.00    Pack years: 10.00    Types: Cigarettes  . Smokeless tobacco: Former Systems developer    Types: Snuff  Substance Use Topics  . Alcohol use: No    Frequency: Never    Comment: occasionally  . Drug use: Yes    Types: Marijuana    Comment: daily     Allergies   Morphine and related; Penicillins; Sulfa antibiotics; Tramadol; and Ibuprofen   Review of Systems Review of Systems  All other systems reviewed and are negative.    Physical Exam Updated Vital Signs BP 121/84   Pulse 81   Temp 98 F (36.7 C) (Oral)   Resp 20   Ht 6' (1.829 m)   Wt 113.4 kg (250 lb)   SpO2 96%   BMI 33.91 kg/m   Physical Exam  Nursing note and vitals reviewed.  37 year old male, resting comfortably and in no acute distress. Vital signs are normal. Oxygen saturation is 96%, which is normal. Head is normocephalic and atraumatic. PERRLA, EOMI. Oropharynx is clear. Neck is nontender and supple without adenopathy or JVD. Back is nontender and there is no CVA tenderness. Lungs are clear without rales, wheezes, or rhonchi. Chest is nontender. Heart has regular rate and rhythm without murmur. Abdomen is soft, flat, nontender without masses or hepatosplenomegaly and peristalsis is normoactive. Extremities: Mild erythema, warmth, swelling of the left ankle with marked tenderness diffusely.  Pain with passive range of motion.  Distal neurovascular exam is intact with strong dorsalis pedis pulse, normal sensation, prompt capillary refill. Skin is warm and dry without rash. Neurologic: Mental status is normal, cranial nerves are intact, there are no motor or sensory deficits.  ED Treatments / Results  Labs (all labs ordered are listed, but only abnormal results are displayed) Labs Reviewed  CBC WITH DIFFERENTIAL/PLATELET - Abnormal; Notable for the following components:      Result Value   WBC 14.2 (*)    Lymphs Abs 5.4 (*)    All other  components within normal limits  BASIC METABOLIC PANEL - Abnormal; Notable for the following components:   Glucose, Bld 102 (*)    All other components within normal limits  SEDIMENTATION RATE  URIC ACID   Procedures Procedures   Medications Ordered in ED Medications  oxyCODONE-acetaminophen (PERCOCET/ROXICET) 5-325 MG per tablet 1 tablet (1 tablet Oral Given 11/23/17 0147)  predniSONE (DELTASONE) tablet 60 mg (60 mg Oral Given 11/23/17 0356)     Initial Impression / Assessment and Plan / ED Course  I have reviewed the triage vital signs and the nursing notes.  Pertinent lab results that were available during my care of the patient were reviewed by me and considered in my medical decision making (see chart  for details).  Left ankle pain of uncertain cause.  Old records are reviewed confirming ED visit on July 22 for ankle pain.  X-ray at that time was negative.  I do not see any indication for repeat x-ray.  He failed to respond to prednisone, but was on a relatively modest dose of 40 mg a day for 3 days.  Will check screening labs today including sedimentation rate and uric acid level.  Is given oxycodone-acetaminophen for pain.  Sedimentation rate is normal.  WBC is elevated, but with normal differential.  Uric acid level is normal, but in the upper end of normal.  Cause for the ankle pain is unclear.  We will try on a higher dose of prednisone, referred to orthopedics for further outpatient work-up.  Discharged with prescription for oxycodone-acetaminophen for pain control.  Final Clinical Impressions(s) / ED Diagnoses   Final diagnoses:  Left ankle pain, unspecified chronicity    ED Discharge Orders        Ordered    oxyCODONE-acetaminophen (PERCOCET) 5-325 MG tablet  Every 4 hours PRN     11/23/17 0358    oxyCODONE-acetaminophen (PERCOCET) 5-325 MG tablet  Every 4 hours PRN     11/23/17 0358    predniSONE (DELTASONE) 20 MG tablet  Daily     63/14/97 0263       [name], MD 78/58/85 0401

## 2017-11-23 NOTE — Discharge Instructions (Addendum)
The cause for your pain is not clear.  Recent x-ray did not show any broken bones, blood work today did not show signs of gout or infection.  Please follow-up with the orthopedic doctor for further evaluation of your ankle pain.

## 2017-11-23 NOTE — ED Triage Notes (Signed)
Pt c/o left ankle and foot pain that has gotten worse since being seen on 11/17/2017, pt states that he was diagnosed with gout and that he has fallen several times attempting to walk up and down his stairs at home,

## 2017-11-24 MED FILL — Oxycodone w/ Acetaminophen Tab 5-325 MG: ORAL | Qty: 6 | Status: AC

## 2018-01-28 DIAGNOSIS — G40409 Other generalized epilepsy and epileptic syndromes, not intractable, without status epilepticus: Secondary | ICD-10-CM | POA: Diagnosis not present

## 2018-01-28 DIAGNOSIS — E1165 Type 2 diabetes mellitus with hyperglycemia: Secondary | ICD-10-CM | POA: Diagnosis not present

## 2018-01-28 DIAGNOSIS — F17210 Nicotine dependence, cigarettes, uncomplicated: Secondary | ICD-10-CM | POA: Diagnosis not present

## 2018-01-28 DIAGNOSIS — I10 Essential (primary) hypertension: Secondary | ICD-10-CM | POA: Diagnosis not present

## 2018-04-30 ENCOUNTER — Ambulatory Visit: Payer: Self-pay | Admitting: Internal Medicine

## 2018-05-01 ENCOUNTER — Encounter: Payer: Self-pay | Admitting: Internal Medicine

## 2018-05-08 ENCOUNTER — Other Ambulatory Visit: Payer: Self-pay

## 2018-05-08 ENCOUNTER — Encounter (HOSPITAL_COMMUNITY): Payer: Self-pay | Admitting: *Deleted

## 2018-05-08 ENCOUNTER — Emergency Department (HOSPITAL_COMMUNITY): Payer: Medicare Other

## 2018-05-08 ENCOUNTER — Emergency Department (HOSPITAL_COMMUNITY)
Admission: EM | Admit: 2018-05-08 | Discharge: 2018-05-08 | Disposition: A | Discharge status: Home / Self Care | Payer: Medicare Other | Attending: Emergency Medicine | Admitting: Emergency Medicine

## 2018-05-08 DIAGNOSIS — I252 Old myocardial infarction: Secondary | ICD-10-CM | POA: Insufficient documentation

## 2018-05-08 DIAGNOSIS — J45909 Unspecified asthma, uncomplicated: Secondary | ICD-10-CM | POA: Diagnosis not present

## 2018-05-08 DIAGNOSIS — F1210 Cannabis abuse, uncomplicated: Secondary | ICD-10-CM | POA: Insufficient documentation

## 2018-05-08 DIAGNOSIS — J441 Chronic obstructive pulmonary disease with (acute) exacerbation: Secondary | ICD-10-CM | POA: Insufficient documentation

## 2018-05-08 DIAGNOSIS — I2510 Atherosclerotic heart disease of native coronary artery without angina pectoris: Secondary | ICD-10-CM | POA: Diagnosis not present

## 2018-05-08 DIAGNOSIS — R05 Cough: Secondary | ICD-10-CM | POA: Diagnosis not present

## 2018-05-08 DIAGNOSIS — I10 Essential (primary) hypertension: Secondary | ICD-10-CM | POA: Diagnosis not present

## 2018-05-08 DIAGNOSIS — F17210 Nicotine dependence, cigarettes, uncomplicated: Secondary | ICD-10-CM | POA: Diagnosis not present

## 2018-05-08 DIAGNOSIS — Z9049 Acquired absence of other specified parts of digestive tract: Secondary | ICD-10-CM | POA: Insufficient documentation

## 2018-05-08 DIAGNOSIS — E119 Type 2 diabetes mellitus without complications: Secondary | ICD-10-CM | POA: Insufficient documentation

## 2018-05-08 DIAGNOSIS — R062 Wheezing: Secondary | ICD-10-CM | POA: Diagnosis present

## 2018-05-08 DIAGNOSIS — R0602 Shortness of breath: Secondary | ICD-10-CM | POA: Diagnosis not present

## 2018-05-08 LAB — BASIC METABOLIC PANEL
Anion gap: 11 (ref 5–15)
BUN: 9 mg/dL (ref 6–20)
CO2: 22 mmol/L (ref 22–32)
Calcium: 8.8 mg/dL — ABNORMAL LOW (ref 8.9–10.3)
Chloride: 106 mmol/L (ref 98–111)
Creatinine, Ser: 0.78 mg/dL (ref 0.61–1.24)
GFR calc Af Amer: >60 mL/min (ref >60)
GFR calc non Af Amer: >60 mL/min (ref >60)
Glucose, Bld: 109 mg/dL — ABNORMAL HIGH (ref 70–99)
Potassium: 3.7 mmol/L (ref 3.5–5.1)
SODIUM: 139 mmol/L (ref 135–145)

## 2018-05-08 LAB — BRAIN NATRIURETIC PEPTIDE: B Natriuretic Peptide: 133 pg/mL — ABNORMAL HIGH (ref 0.0–100.0)

## 2018-05-08 LAB — CBC WITH DIFFERENTIAL/PLATELET
Abs Immature Granulocytes: 0.02 10*3/uL (ref 0.00–0.07)
BASOS ABS: 0.1 10*3/uL (ref 0.0–0.1)
Basophils Relative: 1 %
Eosinophils Absolute: 0.3 10*3/uL (ref 0.0–0.5)
Eosinophils Relative: 3 %
HCT: 51.7 % (ref 39.0–52.0)
Hemoglobin: 16.7 g/dL (ref 13.0–17.0)
IMMATURE GRANULOCYTES: 0 %
Lymphocytes Relative: 33 %
Lymphs Abs: 3.1 10*3/uL (ref 0.7–4.0)
MCH: 30 pg (ref 26.0–34.0)
MCHC: 32.3 g/dL (ref 30.0–36.0)
MCV: 92.8 fL (ref 80.0–100.0)
Monocytes Absolute: 0.7 10*3/uL (ref 0.1–1.0)
Monocytes Relative: 7 %
NRBC: 0 % (ref 0.0–0.2)
Neutro Abs: 5.4 10*3/uL (ref 1.7–7.7)
Neutrophils Relative %: 56 %
Platelets: 210 10*3/uL (ref 150–400)
RBC: 5.57 MIL/uL (ref 4.22–5.81)
RDW: 13.5 % (ref 11.5–15.5)
WBC: 9.6 10*3/uL (ref 4.0–10.5)

## 2018-05-08 LAB — TROPONIN I: Troponin I: <0.03 ng/mL (ref <0.03)

## 2018-05-08 MED ORDER — ALBUTEROL SULFATE HFA 108 (90 BASE) MCG/ACT IN AERS
2.0000 | INHALATION_SPRAY | Freq: Once | RESPIRATORY_TRACT | Status: AC
  Administered 2018-05-08: 2 via RESPIRATORY_TRACT
  Filled 2018-05-08: qty 6.7

## 2018-05-08 MED ORDER — IPRATROPIUM-ALBUTEROL 0.5-2.5 (3) MG/3ML IN SOLN
3.0000 mL | Freq: Once | RESPIRATORY_TRACT | Status: AC
  Administered 2018-05-08: 3 mL via RESPIRATORY_TRACT
  Filled 2018-05-08: qty 3

## 2018-05-08 MED ORDER — PREDNISONE 10 MG PO TABS: 10.0000 mg | ORAL_TABLET | Freq: Every day | ORAL | 0 refills | Status: DC

## 2018-05-08 MED ORDER — DEXAMETHASONE SODIUM PHOSPHATE 4 MG/ML IJ SOLN
8.0000 mg | Freq: Once | INTRAMUSCULAR | Status: AC
  Administered 2018-05-08: 8 mg via INTRAMUSCULAR
  Filled 2018-05-08: qty 2

## 2018-05-08 MED ORDER — ALBUTEROL SULFATE (2.5 MG/3ML) 0.083% IN NEBU
5.0000 mg | INHALATION_SOLUTION | Freq: Once | RESPIRATORY_TRACT | Status: DC
  Administered 2018-05-08: 5 mg via RESPIRATORY_TRACT
  Filled 2018-05-08: qty 6

## 2018-05-08 NOTE — ED Notes (Signed)
resp paged for treatment,

## 2018-05-08 NOTE — ED Provider Notes (Signed)
[facility] EMERGENCY DEPARTMENT Provider Note   CSN: [id number] Arrival date & time: 05/08/18  1650     History   Chief Complaint Chief Complaint  Patient presents with  . Shortness of Breath    HPI Chad Hampton is a 38 y.o. male.  Cough, wheezing for the past 24 hours.  Patient is a smoker and has a history of asthma and COPD.  Past medical history includes CAD, diabetes, hypertension, MI.  He was supposed to be using nebulizer machine at home but has been unable to fill the prescription.  Severity of symptoms is moderate.  Nothing makes symptoms better or worse.     Past Medical History:  Diagnosis Date  . Acute angina ([place])   . Arthritis   . Asthma   . CAD (coronary artery disease)    a. nonobstructive CAD by cath in 01/2017  . COPD (chronic obstructive pulmonary disease) ([place])   . Dental caries   . Diabetes mellitus without complication ([place])   . GERD (gastroesophageal reflux disease)   . Headache   . Hypertension   . Myocardial infarction ([place])   . Seizure disorder ([place])   . Seizures [facility])     Patient Active Problem List   Diagnosis Date Noted  . DM coma, type 2, not at goal [facility]) 06/24/2017  . Seizure ([place]) 06/24/2017  . Abnormal cardiovascular stress test   . 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 Cannabis abuse 05/11/2015  . Substance induced mood disorder ([place]) 05/11/2015    Past Surgical History:  Procedure Laterality Date  . CHOLECYSTECTOMY    . EYE SURGERY    . FACIAL RECONSTRUCTION SURGERY    . LEFT HEART CATH AND CORONARY ANGIOGRAPHY N/A 02/07/2017   Procedure: LEFT HEART CATH AND CORONARY ANGIOGRAPHY;  Surgeon: Jettie Booze, MD;  Location: [place] CV LAB;  Service: Cardiovascular;  Laterality: N/A;  . MULTIPLE EXTRACTIONS WITH ALVEOLOPLASTY N/A 06/06/2017   Procedure: MULTIPLE EXTRACTIONS OF TEETH NUMBERS ONE, SEVENTEEN, EIGHTEEN, NINETEEN, TWENTY-THREE, TWENTY-FOUR, TWENTY-FIVE,  TWENTY-SIX.;  Surgeon: Diona Browner, DDS;  Location: [place];  Service: Oral Surgery;  Laterality: N/A;        Home Medications    Prior to Admission medications   Medication Sig Start Date End Date Taking? Authorizing Provider  albuterol (PROVENTIL) (2.5 MG/3ML) 0.083% nebulizer solution Take 3 mLs (2.5 mg total) by nebulization every 6 (six) hours as needed for wheezing or shortness of breath. Patient not taking: Reported on 05/08/2018 07/13/17   Nat Christen, MD  atorvastatin (LIPITOR) 40 MG tablet Take 1 tablet (40 mg total) by mouth daily. Patient not taking: Reported on 05/08/2018 06/24/17 12/21/17  Erma Heritage, PA-C  phenytoin (DILANTIN) 100 MG ER capsule Take 1 capsule (100 mg total) by mouth 3 (three) times daily. Patient not taking: Reported on 05/08/2018 10/25/17   Orpah Greek, MD  predniSONE (DELTASONE) 10 MG tablet Take 1 tablet (10 mg total) by mouth daily with breakfast. 3 tablets for 3 days, 2 tablets for 3 days, 1 tablet for 3 days 05/08/18   Nat Christen, MD    Family History Family History  Problem Relation Age of Onset  . Diabetes Mother   . Hypertension Mother   . Heart failure Father   . Stroke Father   . Diabetes Father   . Heart failure Brother     Social History Social History   Tobacco Use  . Smoking status: Current Every Day Smoker  Packs/day: 0.50    Years: 20.00    Pack years: 10.00    Types: Cigarettes  . Smokeless tobacco: Former Systems developer    Types: Snuff  Substance Use Topics  . Alcohol use: No    Frequency: Never    Comment: occasionally  . Drug use: Yes    Types: Marijuana    Comment: daily     Allergies   Morphine and related; Penicillins; Sulfa antibiotics; Tramadol; and Ibuprofen   Review of Systems Review of Systems  All other systems reviewed and are negative.    Physical Exam Updated Vital Signs BP 134/79   Pulse 95   Temp 97.8 F (36.6 C) (Oral)   Resp 19   Ht 6' (1.829 m)   Wt 124.7 kg   SpO2 93%    BMI 37.30 kg/m   Physical Exam Vitals signs and nursing note reviewed.  Constitutional:      Appearance: He is well-developed.  HENT:     Head: Normocephalic and atraumatic.  Eyes:     Conjunctiva/sclera: Conjunctivae normal.  Neck:     Musculoskeletal: Neck supple.  Cardiovascular:     Rate and Rhythm: Normal rate and regular rhythm.  Pulmonary:     Effort: Pulmonary effort is normal.     Comments: Bilateral expiratory wheezing; No tachypnea Abdominal:     General: Bowel sounds are normal.     Palpations: Abdomen is soft.  Musculoskeletal: Normal range of motion.  Skin:    General: Skin is warm and dry.  Neurological:     Mental Status: He is alert and oriented to person, place, and time.  Psychiatric:        Behavior: Behavior normal.      ED Treatments / Results  Labs (all labs ordered are listed, but only abnormal results are displayed) Labs Reviewed  BASIC METABOLIC PANEL - Abnormal; Notable for the following components:      Result Value   Glucose, Bld 109 (*)    Calcium 8.8 (*)    All other components within normal limits  BRAIN NATRIURETIC PEPTIDE - Abnormal; Notable for the following components:   B Natriuretic Peptide 133.0 (*)    All other components within normal limits  CBC WITH DIFFERENTIAL/PLATELET  TROPONIN I    EKG EKG Interpretation  Date/Time:  Friday May 08 2018 17:25:46 EST Ventricular Rate:  82 PR Interval:    QRS Duration: 81 QT Interval:  340 QTC Calculation: 397 R Axis:   73 Text Interpretation:  Sinus rhythm Anteroseptal infarct, age indeterminate Confirmed by Nat Christen [phone]) on 05/08/2018 5:42:55 PM   Radiology Dg Chest 2 View  Result Date: 05/08/2018 CLINICAL DATA:  Shortness of breath and dry cough since yesterday. EXAM: CHEST - 2 VIEW COMPARISON:  Chest x-ray dated October 10, 2017. FINDINGS: The heart size and mediastinal contours are within normal limits. Normal pulmonary vascularity. Mildly coarsened interstitial  markings are similar to prior study and likely smoking-related. No focal consolidation, pleural effusion, or pneumothorax. No acute osseous abnormality. IMPRESSION: No active cardiopulmonary disease. Electronically Signed   By: Titus Dubin M.D.   On: 05/08/2018 17:59    Procedures Procedures (including critical care time)  Medications Ordered in ED Medications  ipratropium-albuterol (DUONEB) 0.5-2.5 (3) MG/3ML nebulizer solution 3 mL (3 mLs Nebulization Given 05/08/18 1753)  dexamethasone (DECADRON) injection 8 mg (8 mg Intramuscular Given 05/08/18 1818)  albuterol (PROVENTIL HFA;VENTOLIN HFA) 108 (90 Base) MCG/ACT inhaler 2 puff (2 puffs Inhalation Given 05/08/18 1948)  Initial Impression / Assessment and Plan / ED Course  I have reviewed the triage vital signs and the nursing notes.  Pertinent labs & imaging results that were available during my care of the patient were reviewed by me and considered in my medical decision making (see chart for details).     Patient with a known COPD and asthma presents with coughing and wheezing.  Chest x-ray negative.  He responded well to a nebulizer treatment, IM steroids.  Will discharge home with albuterol inhaler, nebulizer machine, prednisone.  Respiratory status stable at discharge.  Final Clinical Impressions(s) / ED Diagnoses   Final diagnoses:  COPD exacerbation [facility])    ED Discharge Orders         Ordered    predniSONE (DELTASONE) 10 MG tablet  Daily with breakfast     05/08/18 2058           Nat Christen, MD 05/08/18 2201

## 2018-05-08 NOTE — ED Notes (Signed)
Respiratory therapist aware of pending nebulizer treatment.

## 2018-05-08 NOTE — ED Triage Notes (Signed)
Pt c/o SOB and dry cough that started yesterday.

## 2018-05-08 NOTE — Discharge Instructions (Addendum)
Chest x-ray showed no pneumonia.  Prescription for prednisone and nebulizer machine.  Use your inhaler as needed.

## 2018-05-13 ENCOUNTER — Other Ambulatory Visit: Payer: Self-pay

## 2018-05-13 ENCOUNTER — Emergency Department (HOSPITAL_COMMUNITY)
Admission: EM | Admit: 2018-05-13 | Discharge: 2018-05-14 | Disposition: A | Discharge status: Home / Self Care | Payer: Medicare Other | Attending: Emergency Medicine | Admitting: Emergency Medicine

## 2018-05-13 ENCOUNTER — Encounter (HOSPITAL_COMMUNITY): Payer: Self-pay | Admitting: Emergency Medicine

## 2018-05-13 DIAGNOSIS — I10 Essential (primary) hypertension: Secondary | ICD-10-CM | POA: Insufficient documentation

## 2018-05-13 DIAGNOSIS — F17210 Nicotine dependence, cigarettes, uncomplicated: Secondary | ICD-10-CM | POA: Insufficient documentation

## 2018-05-13 DIAGNOSIS — E785 Hyperlipidemia, unspecified: Secondary | ICD-10-CM | POA: Insufficient documentation

## 2018-05-13 DIAGNOSIS — H6692 Otitis media, unspecified, left ear: Secondary | ICD-10-CM | POA: Insufficient documentation

## 2018-05-13 DIAGNOSIS — H6093 Unspecified otitis externa, bilateral: Secondary | ICD-10-CM

## 2018-05-13 DIAGNOSIS — E119 Type 2 diabetes mellitus without complications: Secondary | ICD-10-CM | POA: Insufficient documentation

## 2018-05-13 DIAGNOSIS — J449 Chronic obstructive pulmonary disease, unspecified: Secondary | ICD-10-CM | POA: Diagnosis not present

## 2018-05-13 DIAGNOSIS — I2510 Atherosclerotic heart disease of native coronary artery without angina pectoris: Secondary | ICD-10-CM | POA: Insufficient documentation

## 2018-05-13 DIAGNOSIS — H9203 Otalgia, bilateral: Secondary | ICD-10-CM | POA: Diagnosis present

## 2018-05-13 MED ORDER — IPRATROPIUM-ALBUTEROL 0.5-2.5 (3) MG/3ML IN SOLN
3.0000 mL | Freq: Once | RESPIRATORY_TRACT | Status: AC
  Administered 2018-05-13: 3 mL via RESPIRATORY_TRACT
  Filled 2018-05-13: qty 3

## 2018-05-13 MED ORDER — ACETAMINOPHEN 325 MG PO TABS
650.0000 mg | ORAL_TABLET | Freq: Once | ORAL | Status: AC
  Administered 2018-05-13: 650 mg via ORAL
  Filled 2018-05-13: qty 2

## 2018-05-13 MED ORDER — NEOMYCIN-POLYMYXIN-HC 1 % OT SOLN
4.0000 [drp] | Freq: Once | OTIC | Status: AC
  Administered 2018-05-13: 4 [drp] via OTIC
  Filled 2018-05-13: qty 10

## 2018-05-13 MED ORDER — HYDROGEN PEROXIDE 3 % EX SOLN
CUTANEOUS | Status: AC
  Administered 2018-05-13: 1
  Filled 2018-05-13: qty 473

## 2018-05-13 NOTE — ED Triage Notes (Signed)
Patient reports bilat otalgia that started today around 1600.

## 2018-05-13 NOTE — ED Notes (Signed)
Ears irrigated with warm water and peroxide. Patient states improvement with pain and hearing. Ears appear to be clear of wax.

## 2018-05-13 NOTE — Progress Notes (Signed)
Cardiology Office Note    Date:  05/25/2018   ID:  Chad Hampton, DOB 1980-08-21, MRN [id number]  PCP:  Lemmie Evens, MD  Cardiologist: Dorris Carnes, MD EPS: None  Chief Complaint  Patient presents with  . Follow-up    History of Present Illness:  Chad Hampton is a 39 y.o. male with history of nonobstructive CAD with normal LVEF 55 to 65% on cath 01/2017, hypertension, HLD, tobacco abuse.  Patient last saw Bernerd Pho, PA-C 06/24/2017 at which time he was having chronic chest pain worse with bending over after food consumption.He had quit taking all of his medications because of affordability.  He was still smoking 1 to 2 packs a day and marijuana regularly.  Was in the emergency room 05/08/2018 for shortness of breath felt to be COPD exacerbation treated with nebulizers and steroids.  Patient comes in for yearly follow-up.  Denies any chest pain.  He splits wood to heat his home and walks a couple miles to visit his friend every week.  Denies any chest pain, palpitations, dyspnea.  He says he quit smoking marijuana last May and started having seizures so he quit seizure medications and started smoking marijuana again.  Smokes a pack of cigarettes daily.  Rare alcohol.  Past Medical History:  Diagnosis Date  . Acute angina ([place])   . Arthritis   . Asthma   . CAD (coronary artery disease)    a. nonobstructive CAD by cath in 01/2017  . COPD (chronic obstructive pulmonary disease) ([place])   . Dental caries   . Diabetes mellitus without complication ([place])   . GERD (gastroesophageal reflux disease)   . Headache   . Hypertension   . Myocardial infarction ([place])   . Seizure disorder ([place])   . Seizures ([place])     Past Surgical History:  Procedure Laterality Date  . CHOLECYSTECTOMY    . EYE SURGERY    . FACIAL RECONSTRUCTION SURGERY    . LEFT HEART CATH AND CORONARY ANGIOGRAPHY N/A 02/07/2017   Procedure: LEFT HEART CATH AND CORONARY ANGIOGRAPHY;  Surgeon: Jettie Booze, MD;  Location: [facility];  Service: Cardiovascular;  Laterality: N/A;  . MULTIPLE EXTRACTIONS WITH ALVEOLOPLASTY N/A 06/06/2017   Procedure: MULTIPLE EXTRACTIONS OF TEETH NUMBERS ONE, SEVENTEEN, EIGHTEEN, NINETEEN, TWENTY-THREE, TWENTY-FOUR, TWENTY-FIVE, TWENTY-SIX.;  Surgeon: Diona Browner, DDS;  Location: [facility];  Service: Oral Surgery;  Laterality: N/A;    Current Medications: Current Meds  Medication Sig  . albuterol (PROVENTIL) (2.5 MG/3ML) 0.083% nebulizer solution Take 3 mLs (2.5 mg total) by nebulization every 6 (six) hours as needed for wheezing or shortness of breath.  Marland Kitchen atorvastatin (LIPITOR) 40 MG tablet Take 1 tablet (40 mg total) by mouth daily.     Allergies:   Morphine and related; Penicillins; Sulfa antibiotics; Tramadol; and Ibuprofen   Social History   Socioeconomic History  . Marital status: Widowed    Spouse name: Not on file  . Number of children: Not on file  . Years of education: Not on file  . Highest education level: Not on file  Occupational History  . Not on file  Social Needs  . Financial resource strain: Not on file  . Food insecurity:    Worry: Not on file    Inability: Not on file  . Transportation needs:    Medical: Not on file    Non-medical: Not on file  Tobacco Use  . Smoking status: Current Every Day Smoker  Packs/day: 0.50    Years: 20.00    Pack years: 10.00    Types: Cigarettes  . Smokeless tobacco: Former Systems developer    Types: Snuff  Substance and Sexual Activity  . Alcohol use: No    Frequency: Never    Comment: occasionally  . Drug use: Yes    Types: Marijuana    Comment: daily  . Sexual activity: Never  Lifestyle  . Physical activity:    Days per week: Not on file    Minutes per session: Not on file  . Stress: Not on file  Relationships  . Social connections:    Talks on phone: Not on file    Gets together: Not on file    Attends religious service: Not on file    Active member of club or organization: Not on  file    Attends meetings of clubs or organizations: Not on file    Relationship status: Not on file  Other Topics Concern  . Not on file  Social History Narrative  . Not on file     Family History:  The patient's family history includes Diabetes in his father and mother; Heart failure in his brother and father; Hypertension in his mother; Stroke in his father.   ROS:   Please see the history of present illness.    Review of Systems  Constitution: Negative.  HENT: Negative.   Cardiovascular: Negative.   Respiratory: Positive for cough.   Endocrine: Negative.   Hematologic/Lymphatic: Negative.   Musculoskeletal: Negative.   Gastrointestinal: Negative.   Genitourinary: Negative.   Neurological: Negative.    All other systems reviewed and are negative.   PHYSICAL EXAM:   VS:  BP 128/86   Pulse 86   Ht 6' (1.829 m)   Wt 270 lb (122.5 kg)   SpO2 99%   BMI 36.62 kg/m   Physical Exam  GEN: Obese, in no acute distress, smells of cigarettes Neck: no JVD, carotid bruits, or masses Cardiac:RRR; 2/6 systolic murmur the left sternal border respiratory: Decreased breath sounds throughout with scattered rhonchi  GI: soft, nontender, nondistended, + BS Ext: without cyanosis, clubbing, or edema, Good distal pulses bilaterally Neuro:  Alert and Oriented x 3 Psych: euthymic mood, full affect  Wt Readings from Last 3 Encounters:  05/25/18 270 lb (122.5 kg)  05/13/18 273 lb 5.9 oz (124 kg)  05/08/18 275 lb (124.7 kg)      Studies/Labs Reviewed:   EKG:  EKG is not ordered today.    Recent Labs: 05/08/2018: B Natriuretic Peptide 133.0; BUN 9; Creatinine, Ser 0.78; Hemoglobin 16.7; Platelets 210; Potassium 3.7; Sodium 139   Lipid Panel    Component Value Date/Time   CHOL 141 07/10/2015 1529   TRIG 310 (H) 07/10/2015 1529   HDL 17 (L) 07/10/2015 1529   CHOLHDL 8.3 (H) 07/10/2015 1529   VLDL 62 (H) 07/10/2015 1529   LDLCALC 62 07/10/2015 1529    Additional studies/ records  that were reviewed today include:    Echocardiogram: 01/2017 Study Conclusions   - Left ventricle: The cavity size was normal. Systolic function was   normal. The estimated ejection fraction was in the range of 60%   to 65%. Wall motion was normal; there were no regional wall   motion abnormalities. Left ventricular diastolic function   parameters were normal. - Aortic valve: There was mild regurgitation.   Cardiac Catheterization: 01/2017  Ost LAD to Prox LAD lesion, 25 %stenosed.  Ost Cx to Prox Cx lesion,  25 %stenosed.  The left ventricular systolic function is normal.  LV end diastolic pressure is normal.  The left ventricular ejection fraction is 55-65% by visual estimate.  There is no aortic valve stenosis.   Nonobstructive coronary artery disease.     Continue with aggressive primary prevention including smoking cessation.       ASSESSMENT:    1. Abnormal cardiovascular stress test   2. Essential hypertension   3. Mixed hyperlipidemia   4. Tobacco abuse   5. Cannabis abuse      PLAN:  In order of problems listed above:  History of abnormal stress test with  nonobstructive CAD on cath 01/2017 normal LVEF 55 to 65%.  Splits wood and remains active.  No recent angina.  Follow-up in 1 year with Dr. Harrington Challenger.  Essential hypertension has stopped all his medications in the past due to inability of to afford them.  Blood pressure is normal off medications.  Hyperlipidemia on atorvastatin 40 mg check fasting lipids and LFTs  Tobacco abuse as well as marijuana use which he continues to use.  Smoking cessation discussed in detail with the patient  Medication Adjustments/Labs and Tests Ordered: Current medicines are reviewed at length with the patient today.  Concerns regarding medicines are outlined above.  Medication changes, Labs and Tests ordered today are listed in the Patient Instructions below. Patient Instructions  Medication Instructions:  Your physician  recommends that you continue on your current medications as directed. Please refer to the Current Medication list given to you today.  If you need a refill on your cardiac medications before your next appointment, please call your pharmacy.   Lab work: Your physician recommends that you return for lab work in: Today    If you have labs (blood work) drawn today and your tests are completely normal, you will receive your results only by: Marland Kitchen MyChart Message (if you have MyChart) OR . A paper copy in the mail If you have any lab test that is abnormal or we need to change your treatment, we will call you to review the results.  Testing/Procedures: NONE   Follow-Up: At [facility], you and your health needs are our priority.  As part of our continuing mission to provide you with exceptional heart care, we have created designated Provider Care Teams.  These Care Teams include your primary Cardiologist (physician) and Advanced Practice Providers (APPs -  Physician Assistants and Nurse Practitioners) who all work together to provide you with the care you need, when you need it. You will need a follow up appointment in 1 years.  Please call our office 2 months in advance to schedule this appointment.  You may see Dorris Carnes, MD or one of the following Advanced Practice Providers on your designated Care Team:   Bernerd Pho, PA-C [facility]) . Ermalinda Barrios, PA-C ([place])  Any Other Special Instructions Will Be Listed Below (If Applicable). Thank you for choosing [facility]!        Sumner Boast, PA-C  05/25/2018 1:47 PM    [facility] [address], [place], [place]  [address] Phone: [phone]; Fax: [phone]

## 2018-05-13 NOTE — ED Provider Notes (Signed)
[facility] EMERGENCY DEPARTMENT Provider Note   CSN: [id number] Arrival date & time: 05/13/18  2217     History   Chief Complaint Chief Complaint  Patient presents with  . Otalgia    HPI Chad Hampton is a 38 y.o. male who presents to the ED with bilateral ear pain and feeling stopped up and decreased hearing. Patient reports being here last week for his COPD but was d/c home. He denies shortness of breath, chest pain or other problems tonight.   HPI  Past Medical History:  Diagnosis Date  . Acute angina ([place])   . Arthritis   . Asthma   . CAD (coronary artery disease)    a. nonobstructive CAD by cath in 01/2017  . COPD (chronic obstructive pulmonary disease) ([place])   . Dental caries   . Diabetes mellitus without complication ([place])   . GERD (gastroesophageal reflux disease)   . Headache   . Hypertension   . Myocardial infarction ([place])   . Seizure disorder ([place])   . Seizures [facility])     Patient Active Problem List   Diagnosis Date Noted  . Essential hypertension 05/13/2018  . Hyperlipidemia 05/13/2018  . DM coma, type 2, not at goal [facility]) 06/24/2017  . Seizure ([place]) 06/24/2017  . Abnormal cardiovascular stress test   . MDD (major depressive disorder), recurrent severe, without psychosis ([place]) 05/11/2015  . Cannabis abuse 05/11/2015  . Substance induced mood disorder ([place]) 05/11/2015    Past Surgical History:  Procedure Laterality Date  . CHOLECYSTECTOMY    . EYE SURGERY    . FACIAL RECONSTRUCTION SURGERY    . LEFT HEART CATH AND CORONARY ANGIOGRAPHY N/A 02/07/2017   Procedure: LEFT HEART CATH AND CORONARY ANGIOGRAPHY;  Surgeon: Jettie Booze, MD;  Location: [facility];  Service: Cardiovascular;  Laterality: N/A;  . MULTIPLE EXTRACTIONS WITH ALVEOLOPLASTY N/A 06/06/2017   Procedure: MULTIPLE EXTRACTIONS OF TEETH NUMBERS ONE, SEVENTEEN, EIGHTEEN, NINETEEN, TWENTY-THREE, TWENTY-FOUR, TWENTY-FIVE, TWENTY-SIX.;  Surgeon: Diona Browner, DDS;  Location: [facility];  Service: Oral Surgery;  Laterality: N/A;        Home Medications    Prior to Admission medications   Medication Sig Start Date End Date Taking? Authorizing Provider  predniSONE (DELTASONE) 10 MG tablet Take 1 tablet (10 mg total) by mouth daily with breakfast. 3 tablets for 3 days, 2 tablets for 3 days, 1 tablet for 3 days 05/08/18  Yes Nat Christen, MD  albuterol (PROVENTIL) (2.5 MG/3ML) 0.083% nebulizer solution Take 3 mLs (2.5 mg total) by nebulization every 6 (six) hours as needed for wheezing or shortness of breath. Patient not taking: Reported on 05/08/2018 07/13/17   Nat Christen, MD  atorvastatin (LIPITOR) 40 MG tablet Take 1 tablet (40 mg total) by mouth daily. Patient not taking: Reported on 05/08/2018 06/24/17 12/21/17  Erma Heritage, PA-C  azithromycin [facility]) 250 MG tablet Take one tablet PO daily 05/14/18   Ashley Murrain, NP  phenytoin (DILANTIN) 100 MG ER capsule Take 1 capsule (100 mg total) by mouth 3 (three) times daily. Patient not taking: Reported on 05/08/2018 10/25/17   Orpah Greek, MD    Family History Family History  Problem Relation Age of Onset  . Diabetes Mother   . Hypertension Mother   . Heart failure Father   . Stroke Father   . Diabetes Father   . Heart failure Brother     Social History Social History   Tobacco Use  . Smoking status: Current  Every Day Smoker    Packs/day: 0.50    Years: 20.00    Pack years: 10.00    Types: Cigarettes  . Smokeless tobacco: Former Systems developer    Types: Snuff  Substance Use Topics  . Alcohol use: No    Frequency: Never    Comment: occasionally  . Drug use: Yes    Types: Marijuana    Comment: daily     Allergies   Morphine and related; Penicillins; Sulfa antibiotics; Tramadol; and Ibuprofen   Review of Systems Review of Systems  Constitutional: Negative for chills and fever.  HENT: Positive for congestion, ear pain and sore throat.   Respiratory: Positive for cough (not new).     Cardiovascular: Negative for chest pain.  Gastrointestinal: Negative for abdominal pain, nausea and vomiting.  Musculoskeletal: Negative for neck stiffness.  Neurological: Negative for headaches.     Physical Exam Updated Vital Signs BP 138/69 (BP Location: Right Arm)   Pulse 72   Temp 98 F (36.7 C) (Oral)   Resp 18   Ht 6' (1.829 m)   Wt 124 kg   SpO2 92%   BMI 37.08 kg/m   Physical Exam Vitals signs and nursing note reviewed.  Constitutional:      General: He is not in acute distress.    Appearance: He is well-developed.  HENT:     Head: Normocephalic.     Ears:     Comments: Bilateral TM's occluded by cerumen. Ears irrigated and after irrigation left TM with erythema and ear canal swollen. Right TM not visualized due to cerumen and was to painful to continue irrigation.     Nose: Congestion present.     Mouth/Throat:     Pharynx: Posterior oropharyngeal erythema present.  Eyes:     Conjunctiva/sclera: Conjunctivae normal.  Neck:     Musculoskeletal: Neck supple.  Cardiovascular:     Rate and Rhythm: Normal rate and regular rhythm.  Pulmonary:     Effort: No respiratory distress.     Breath sounds: Decreased air movement present. Wheezing present.  Abdominal:     Palpations: Abdomen is soft.     Tenderness: There is no abdominal tenderness.  Musculoskeletal: Normal range of motion.  Skin:    General: Skin is warm and dry.  Neurological:     Mental Status: He is alert and oriented to person, place, and time.    Patient re examined after neb treatment, air movement improved. Continues to have wheezing but improved.   ED Treatments / Results  Labs (all labs ordered are listed, but only abnormal results are displayed) Labs Reviewed - No data to display  Radiology No results found.  Procedures Procedures (including critical care time)  Medications Ordered in ED Medications  azithromycin (ZITHROMAX) tablet 500 mg (has no administration in time range)   hydrogen peroxide 3 % external solution (1 application  Given 3/42/87 2302)  acetaminophen (TYLENOL) tablet 650 mg (650 mg Oral Given 05/13/18 2345)  ipratropium-albuterol (DUONEB) 0.5-2.5 (3) MG/3ML nebulizer solution 3 mL (3 mLs Nebulization Given 05/13/18 2349)  NEOMYCIN-POLYMYXIN-HYDROCORTISONE (CORTISPORIN) OTIC (EAR) solution 4 drop (4 drops Both EARS Given 05/13/18 2346)     Initial Impression / Assessment and Plan / ED Course  I have reviewed the triage vital signs and the nursing notes. 38 y.o. male here with bilateral ear pain stable for d/c. Will treat with Zithromax since patient allergic to Penicillin. Patient to f/u with PCP this week for recheck. Will treat otitis external with cortisporin  Otic. First dose instilled prior to d/c with directions to patient. Return precautions discussed.   Final Clinical Impressions(s) / ED Diagnoses   Final diagnoses:  Left otitis media, unspecified otitis media type  Otitis externa of both ears, unspecified chronicity, unspecified type    ED Discharge Orders         Ordered    azithromycin (ZITHROMAX) 250 MG tablet     05/14/18 0142           Debroah Baller T[place], NP 05/14/18 0147    Rolland Porter, MD 05/14/18 [phone]

## 2018-05-14 DIAGNOSIS — H6692 Otitis media, unspecified, left ear: Secondary | ICD-10-CM | POA: Diagnosis not present

## 2018-05-14 MED ORDER — AZITHROMYCIN 250 MG PO TABS
500.0000 mg | ORAL_TABLET | Freq: Once | ORAL | Status: AC
  Administered 2018-05-14: 500 mg via ORAL
  Filled 2018-05-14: qty 2

## 2018-05-14 MED ORDER — AZITHROMYCIN 250 MG PO TABS: ORAL_TABLET | ORAL | 0 refills | Status: DC

## 2018-05-14 NOTE — Discharge Instructions (Signed)
Use the ear drops 4 drops in each ear three times a day for the next week. Follow up with your doctor in the next few days for recheck. Return here as needed.

## 2018-05-25 ENCOUNTER — Encounter: Payer: Self-pay | Admitting: Physician Assistant

## 2018-05-25 ENCOUNTER — Other Ambulatory Visit (HOSPITAL_COMMUNITY)
Admission: RE | Admit: 2018-05-25 | Discharge: 2018-05-25 | Disposition: A | Discharge status: Home / Self Care | Payer: Medicare Other | Source: Ambulatory Visit | Attending: Physician Assistant | Admitting: Physician Assistant

## 2018-05-25 ENCOUNTER — Ambulatory Visit (INDEPENDENT_AMBULATORY_CARE_PROVIDER_SITE_OTHER): Payer: Medicare Other | Admitting: Physician Assistant

## 2018-05-25 VITALS — BP 128/86 | HR 86 | Ht 72.0 in | Wt 270.0 lb

## 2018-05-25 DIAGNOSIS — F1210 Cannabis abuse, uncomplicated: Secondary | ICD-10-CM

## 2018-05-25 DIAGNOSIS — Z720 Tobacco use: Secondary | ICD-10-CM | POA: Diagnosis not present

## 2018-05-25 DIAGNOSIS — R9439 Abnormal result of other cardiovascular function study: Secondary | ICD-10-CM | POA: Diagnosis not present

## 2018-05-25 DIAGNOSIS — I10 Essential (primary) hypertension: Secondary | ICD-10-CM | POA: Diagnosis not present

## 2018-05-25 DIAGNOSIS — E782 Mixed hyperlipidemia: Secondary | ICD-10-CM | POA: Insufficient documentation

## 2018-05-25 LAB — HEPATIC FUNCTION PANEL
ALT: 43 U/L (ref 0–44)
AST: 29 U/L (ref 15–41)
Albumin: 3.8 g/dL (ref 3.5–5.0)
Alkaline Phosphatase: 82 U/L (ref 38–126)
Bilirubin, Direct: 0.2 mg/dL (ref 0.0–0.2)
Indirect Bilirubin: 0.8 mg/dL (ref 0.3–0.9)
TOTAL PROTEIN: 8.1 g/dL (ref 6.5–8.1)
Total Bilirubin: 1 mg/dL (ref 0.3–1.2)

## 2018-05-25 LAB — LIPID PANEL
Cholesterol: 174 mg/dL (ref 0–200)
HDL: 25 mg/dL — ABNORMAL LOW (ref >40)
LDL Cholesterol: 112 mg/dL — ABNORMAL HIGH (ref 0–99)
Total CHOL/HDL Ratio: 7 RATIO
Triglycerides: 183 mg/dL — ABNORMAL HIGH (ref <150)
VLDL: 37 mg/dL (ref 0–40)

## 2018-05-25 NOTE — Patient Instructions (Signed)
Medication Instructions:  Your physician recommends that you continue on your current medications as directed. Please refer to the Current Medication list given to you today.  If you need a refill on your cardiac medications before your next appointment, please call your pharmacy.   Lab work: Your physician recommends that you return for lab work in: Today    If you have labs (blood work) drawn today and your tests are completely normal, you will receive your results only by: Marland Kitchen MyChart Message (if you have MyChart) OR . A paper copy in the mail If you have any lab test that is abnormal or we need to change your treatment, we will call you to review the results.  Testing/Procedures: NONE   Follow-Up: At [facility], you and your health needs are our priority.  As part of our continuing mission to provide you with exceptional heart care, we have created designated Provider Care Teams.  These Care Teams include your primary Cardiologist (physician) and Advanced Practice Providers (APPs -  Physician Assistants and Nurse Practitioners) who all work together to provide you with the care you need, when you need it. You will need a follow up appointment in 1 years.  Please call our office 2 months in advance to schedule this appointment.  You may see Dorris Carnes, MD or one of the following Advanced Practice Providers on your designated Care Team:   Bernerd Pho, PA-C [facility]) . Ermalinda Barrios, PA-C ([place])  Any Other Special Instructions Will Be Listed Below (If Applicable). Thank you for choosing [facility]!

## 2018-05-27 ENCOUNTER — Telehealth: Payer: Self-pay | Admitting: *Deleted

## 2018-05-27 DIAGNOSIS — E785 Hyperlipidemia, unspecified: Secondary | ICD-10-CM

## 2018-05-27 MED ORDER — ATORVASTATIN CALCIUM 80 MG PO TABS: 80.0000 mg | ORAL_TABLET | Freq: Every day | ORAL | 3 refills | Status: DC

## 2018-05-27 NOTE — Telephone Encounter (Signed)
-----   Message from Imogene Burn, PA-C sent at 05/25/2018  3:30 PM EST ----- LDL above goal at 112.  Goal is less than 70.  Please increase Lipitor to 80 mg daily and repeat fasting lipid panel and LFTs in 3 months

## 2018-05-27 NOTE — Telephone Encounter (Signed)
Orders placed.

## 2018-05-27 NOTE — Telephone Encounter (Signed)
Called patient with test results. No answer. Left message to call back.  

## 2018-06-24 ENCOUNTER — Encounter (HOSPITAL_COMMUNITY): Payer: Self-pay | Admitting: *Deleted

## 2018-06-24 ENCOUNTER — Emergency Department (HOSPITAL_COMMUNITY)
Admission: EM | Admit: 2018-06-24 | Discharge: 2018-06-24 | Disposition: A | Discharge status: Home / Self Care | Payer: Medicare Other | Attending: Emergency Medicine | Admitting: Emergency Medicine

## 2018-06-24 ENCOUNTER — Other Ambulatory Visit: Payer: Self-pay

## 2018-06-24 DIAGNOSIS — E119 Type 2 diabetes mellitus without complications: Secondary | ICD-10-CM | POA: Diagnosis not present

## 2018-06-24 DIAGNOSIS — J449 Chronic obstructive pulmonary disease, unspecified: Secondary | ICD-10-CM | POA: Insufficient documentation

## 2018-06-24 DIAGNOSIS — J45909 Unspecified asthma, uncomplicated: Secondary | ICD-10-CM | POA: Diagnosis not present

## 2018-06-24 DIAGNOSIS — I2510 Atherosclerotic heart disease of native coronary artery without angina pectoris: Secondary | ICD-10-CM | POA: Insufficient documentation

## 2018-06-24 DIAGNOSIS — G40909 Epilepsy, unspecified, not intractable, without status epilepticus: Secondary | ICD-10-CM

## 2018-06-24 DIAGNOSIS — F17210 Nicotine dependence, cigarettes, uncomplicated: Secondary | ICD-10-CM | POA: Diagnosis not present

## 2018-06-24 DIAGNOSIS — R569 Unspecified convulsions: Secondary | ICD-10-CM | POA: Diagnosis not present

## 2018-06-24 DIAGNOSIS — I10 Essential (primary) hypertension: Secondary | ICD-10-CM | POA: Insufficient documentation

## 2018-06-24 DIAGNOSIS — Z79899 Other long term (current) drug therapy: Secondary | ICD-10-CM | POA: Diagnosis not present

## 2018-06-24 LAB — CBC WITH DIFFERENTIAL/PLATELET
Abs Immature Granulocytes: 0.03 10*3/uL (ref 0.00–0.07)
Basophils Absolute: 0.1 10*3/uL (ref 0.0–0.1)
Basophils Relative: 1 %
Eosinophils Absolute: 0.1 10*3/uL (ref 0.0–0.5)
Eosinophils Relative: 1 %
HCT: 47.8 % (ref 39.0–52.0)
HEMOGLOBIN: 15.5 g/dL (ref 13.0–17.0)
Immature Granulocytes: 0 %
LYMPHS ABS: 2 10*3/uL (ref 0.7–4.0)
Lymphocytes Relative: 17 %
MCH: 29.6 pg (ref 26.0–34.0)
MCHC: 32.4 g/dL (ref 30.0–36.0)
MCV: 91.2 fL (ref 80.0–100.0)
Monocytes Absolute: 0.6 10*3/uL (ref 0.1–1.0)
Monocytes Relative: 5 %
NRBC: 0 % (ref 0.0–0.2)
Neutro Abs: 8.8 10*3/uL — ABNORMAL HIGH (ref 1.7–7.7)
Neutrophils Relative %: 76 %
Platelets: 256 10*3/uL (ref 150–400)
RBC: 5.24 MIL/uL (ref 4.22–5.81)
RDW: 13.2 % (ref 11.5–15.5)
WBC: 11.6 10*3/uL — ABNORMAL HIGH (ref 4.0–10.5)

## 2018-06-24 LAB — COMPREHENSIVE METABOLIC PANEL
ALT: 45 U/L — ABNORMAL HIGH (ref 0–44)
AST: 27 U/L (ref 15–41)
Albumin: 3.9 g/dL (ref 3.5–5.0)
Alkaline Phosphatase: 81 U/L (ref 38–126)
Anion gap: 7 (ref 5–15)
BILIRUBIN TOTAL: 0.3 mg/dL (ref 0.3–1.2)
BUN: 11 mg/dL (ref 6–20)
CO2: 23 mmol/L (ref 22–32)
Calcium: 9.8 mg/dL (ref 8.9–10.3)
Chloride: 109 mmol/L (ref 98–111)
Creatinine, Ser: 0.92 mg/dL (ref 0.61–1.24)
GFR calc Af Amer: >60 mL/min (ref >60)
GFR calc non Af Amer: >60 mL/min (ref >60)
Glucose, Bld: 117 mg/dL — ABNORMAL HIGH (ref 70–99)
POTASSIUM: 3.7 mmol/L (ref 3.5–5.1)
Sodium: 139 mmol/L (ref 135–145)
Total Protein: 7.5 g/dL (ref 6.5–8.1)

## 2018-06-24 LAB — ETHANOL: Alcohol, Ethyl (B): <10 mg/dL (ref <10)

## 2018-06-24 LAB — VALPROIC ACID LEVEL

## 2018-06-24 LAB — PHENYTOIN LEVEL, TOTAL: Phenytoin Lvl: <2.5 ug/mL — ABNORMAL LOW (ref 10.0–20.0)

## 2018-06-24 MED ORDER — PHENYTOIN 50 MG PO CHEW
400.0000 mg | CHEWABLE_TABLET | ORAL | Status: DC
  Administered 2018-06-24 (×3): 400 mg via ORAL
  Filled 2018-06-24 (×6): qty 8

## 2018-06-24 MED ORDER — DIVALPROEX SODIUM ER 500 MG PO TB24
ORAL_TABLET | ORAL | Status: AC
  Filled 2018-06-24: qty 1

## 2018-06-24 MED ORDER — DIVALPROEX SODIUM 250 MG PO DR TAB
500.0000 mg | DELAYED_RELEASE_TABLET | Freq: Once | ORAL | Status: AC
  Administered 2018-06-24: 500 mg via ORAL

## 2018-06-24 MED ORDER — PHENYTOIN 50 MG PO CHEW
200.0000 mg | CHEWABLE_TABLET | Freq: Once | ORAL | Status: DC
  Filled 2018-06-24 (×2): qty 4

## 2018-06-24 MED ORDER — PHENYTOIN SODIUM EXTENDED 100 MG PO CAPS: 100.0000 mg | ORAL_CAPSULE | Freq: Three times a day (TID) | ORAL | 0 refills | Status: DC

## 2018-06-24 MED ORDER — PHENYTOIN 50 MG PO CHEW
CHEWABLE_TABLET | ORAL | Status: AC
  Filled 2018-06-24: qty 4

## 2018-06-24 MED ORDER — DIVALPROEX SODIUM 500 MG PO DR TAB: 500.0000 mg | DELAYED_RELEASE_TABLET | Freq: Two times a day (BID) | ORAL | 0 refills | Status: DC

## 2018-06-24 NOTE — ED Triage Notes (Signed)
Pt brought in by [facility] for c/o witnessed seizure by [employer]; pt is in police custody; pt has been alert and oriented the whole time with ems

## 2018-06-24 NOTE — ED Provider Notes (Signed)
[facility] Provider Note   CSN: [id number] Arrival date & time: 06/24/18  0008    History   Chief Complaint Chief Complaint  Patient presents with  . Seizures    HPI Chad Hampton is a 38 y.o. male.     Patient here in police custody after suspected seizure.  Patient was arrested on a warrant and brought to the police station.  Officers report that when the light turned on to take his picture the patient stated he had a bad taste in his mouth and felt like he was going to have a seizure.  He was witnessed to have stiffening of his arms and legs and his jaw.  He was shaking all over for less than 1 minute with clenched teeth by officers' report.  He was lowered to the ground but did not hit his head or lose consciousness.  This lasted for less than 1 minute and patient was confused for about 30 seconds afterwards.  He feels back to baseline now.  Patient is alert and oriented x3.  He states he does not remember what happened.  Admits that he does have a history of seizures and not has not had his medications which are Dilantin and Depakote for more than 6 months.  There is no tongue biting or incontinence.  Patient admits to marijuana use but no other drugs.  No alcohol use.  No chest pain or shortness of breath.  No abdominal pain.  No focal weakness, numbness or tingling.  The history is provided by the patient and the police.  Seizures    Past Medical History:  Diagnosis Date  . Acute angina ([place])   . Arthritis   . Asthma   . CAD (coronary artery disease)    a. nonobstructive CAD by cath in 01/2017  . COPD (chronic obstructive pulmonary disease) ([place])   . Dental caries   . Diabetes mellitus without complication ([place])   . GERD (gastroesophageal reflux disease)   . Headache   . Hypertension   . Myocardial infarction ([place])   . Seizure disorder ([place])   . Seizures [facility])     Patient Active Problem List   Diagnosis Date Noted  . Tobacco abuse 05/25/2018    . Essential hypertension 05/13/2018  . Hyperlipidemia 05/13/2018  . DM coma, type 2, not at goal [facility]) 06/24/2017  . Seizure ([place]) 06/24/2017  . Abnormal cardiovascular stress test   . MDD (major depressive disorder), recurrent severe, without psychosis ([place]) 05/11/2015  . Cannabis abuse 05/11/2015  . Substance induced mood disorder ([place]) 05/11/2015    Past Surgical History:  Procedure Laterality Date  . CHOLECYSTECTOMY    . EYE SURGERY    . FACIAL RECONSTRUCTION SURGERY    . LEFT HEART CATH AND CORONARY ANGIOGRAPHY N/A 02/07/2017   Procedure: LEFT HEART CATH AND CORONARY ANGIOGRAPHY;  Surgeon: Jettie Booze, MD;  Location: [place] [facility];  Service: Cardiovascular;  Laterality: N/A;  . MULTIPLE EXTRACTIONS WITH ALVEOLOPLASTY N/A 06/06/2017   Procedure: MULTIPLE EXTRACTIONS OF TEETH NUMBERS ONE, SEVENTEEN, EIGHTEEN, NINETEEN, TWENTY-THREE, TWENTY-FOUR, TWENTY-FIVE, TWENTY-SIX.;  Surgeon: Diona Browner, DDS;  Location: [place];  Service: Oral Surgery;  Laterality: N/A;        Home Medications    Prior to Admission medications   Medication Sig Start Date End Date Taking? Authorizing Provider  albuterol (PROVENTIL) (2.5 MG/3ML) 0.083% nebulizer solution Take 3 mLs (2.5 mg total) by nebulization every 6 (six) hours as needed for wheezing or  shortness of breath. 07/13/17   Nat Christen, MD  atorvastatin (LIPITOR) 80 MG tablet Take 1 tablet (80 mg total) by mouth daily. 05/27/18 08/25/18  Imogene Burn, PA-C    Family History Family History  Problem Relation Age of Onset  . Diabetes Mother   . Hypertension Mother   . Heart failure Father   . Stroke Father   . Diabetes Father   . Heart failure Brother     Social History Social History   Tobacco Use  . Smoking status: Current Every Day Smoker    Packs/day: 0.50    Years: 20.00    Pack years: 10.00    Types: Cigarettes  . Smokeless tobacco: Former Systems developer    Types: Snuff  Substance Use Topics  . Alcohol use: No     Frequency: Never    Comment: occasionally  . Drug use: Yes    Types: Marijuana    Comment: daily     Allergies   Morphine and related; Penicillins; Sulfa antibiotics; Tramadol; and Ibuprofen   Review of Systems Review of Systems  Constitutional: Negative for activity change, appetite change and fever.  Respiratory: Negative for cough, chest tightness and shortness of breath.   Gastrointestinal: Negative for abdominal pain, nausea and vomiting.  Genitourinary: Negative for dysuria and hematuria.  Musculoskeletal: Positive for arthralgias and myalgias.  Neurological: Positive for seizures. Negative for weakness and headaches.   all other systems are negative except as noted in the HPI and PMH.     Physical Exam Updated Hampton Signs BP 139/85 (BP Location: Right Arm)   Pulse (!) 107   Temp 98.6 F (37 C) (Oral)   Resp 18   Ht 6' (1.829 m)   Wt 124.7 kg   SpO2 95%   BMI 37.30 kg/m   Physical Exam Vitals signs and nursing note reviewed.  Constitutional:      General: He is not in acute distress.    Appearance: He is well-developed.  HENT:     Head: Normocephalic and atraumatic.     Mouth/Throat:     Pharynx: No oropharyngeal exudate.     Comments: No tongue trauma, no trismus or malocclusion.  Patient able to bite on tongue blade and break it. Eyes:     Conjunctiva/sclera: Conjunctivae normal.     Pupils: Pupils are equal, round, and reactive to light.  Neck:     Musculoskeletal: Normal range of motion and neck supple.     Comments: No meningismus. Cardiovascular:     Rate and Rhythm: Normal rate and regular rhythm.     Heart sounds: Normal heart sounds. No murmur.  Pulmonary:     Effort: Pulmonary effort is normal. No respiratory distress.     Breath sounds: Normal breath sounds.  Abdominal:     Palpations: Abdomen is soft.     Tenderness: There is no abdominal tenderness. There is no guarding or rebound.  Musculoskeletal: Normal range of motion.         General: No tenderness.  Skin:    General: Skin is warm.     Capillary Refill: Capillary refill takes less than 2 seconds.  Neurological:     General: No focal deficit present.     Mental Status: He is alert and oriented to person, place, and time.     Cranial Nerves: No cranial nerve deficit.     Motor: No abnormal muscle tone.     Coordination: Coordination normal.     Comments: No ataxia on  finger to nose bilaterally. No pronator drift. 5/5 strength throughout. CN 2-12 intact.Equal grip strength. Sensation intact.   Psychiatric:        Behavior: Behavior normal.      ED Treatments / Results  Labs (all labs ordered are listed, but only abnormal results are displayed) Labs Reviewed  CBC WITH DIFFERENTIAL/PLATELET - Abnormal; Notable for the following components:      Result Value   WBC 11.6 (*)    Neutro Abs 8.8 (*)    All other components within normal limits  COMPREHENSIVE METABOLIC PANEL - Abnormal; Notable for the following components:   Glucose, Bld 117 (*)    ALT 45 (*)    All other components within normal limits  PHENYTOIN LEVEL, TOTAL - Abnormal; Notable for the following components:   Phenytoin Lvl <2.5 (*)    All other components within normal limits  VALPROIC ACID LEVEL - Abnormal; Notable for the following components:   Valproic Acid Lvl <10 (*)    All other components within normal limits  ETHANOL    EKG None  Radiology No results found.  Procedures Procedures (including critical care time)  Medications Ordered in ED Medications  divalproex (DEPAKOTE) DR tablet 500 mg (has no administration in time range)     Initial Impression / Assessment and Plan / ED Course  I have reviewed the triage Hampton signs and the nursing notes.  Pertinent labs & imaging results that were available during my care of the patient were reviewed by me and considered in my medical decision making (see chart for details).      Witnessed seizure with history of seizure  disorder and noncompliance. Back to baseline on arrival.  No tongue biting or incontinence.  No fever.  Nonfocal neurological exam without head trauma. No indication for neuroimaging.   Electrolytes are normal.  Depakote and Dilantin levels undetectable.  Patient will be given p.o. loading doses of Dilantin as well as the Depakote dose.  Discussed importance of taking his seizure medications and following up with his PCP and neurologist. Prescriptions given. Patient reminded of  driving laws. Return precautions discussed.   Final Clinical Impressions(s) / ED Diagnoses   Final diagnoses:  Seizure disorder [facility])    ED Discharge Orders    None       Lameka Disla, Annie Main, MD 06/24/18 [phone]

## 2018-06-24 NOTE — ED Notes (Signed)
Patient in police custody.

## 2018-06-24 NOTE — Progress Notes (Signed)
Pharmacy Consult for phenytoin Indication: seizure  Allergies  Allergen Reactions  . Morphine And Related Shortness Of Breath  . Penicillins Other (See Comments)    Pt states high severity reaction but unknown since childhood Has patient had a PCN reaction causing immediate rash, facial/tongue/throat swelling, SOB or lightheadedness with hypotension: Unknown Has patient had a PCN reaction causing severe rash involving mucus membranes or skin necrosis: No Has patient had a PCN reaction that required hospitalization: Yes Has patient had a PCN reaction occurring within the last 10 years: No If all of the above answers are "NO", then may proceed with Cephalosporin  . Sulfa Antibiotics Other (See Comments)    "bothers my breathing"  . Tramadol Other (See Comments)    Seizures   . Ibuprofen Rash    Patient Measurements: Height: 6' (182.9 cm) Weight: 275 lb (124.7 kg) IBW/kg (Calculated) : 77.6 Adjusted Body Weight: 96 kg  Vital Signs: Temp: 98.6 F (37 C) (02/26 0018) Temp Source: Oral (02/26 0018) BP: 130/82 (02/26 0250) Pulse Rate: 89 (02/26 0250)  Labs: Recent Labs    06/24/18 0042  WBC 11.6*  HGB 15.5  PLT 256  CREATININE 0.92    Estimated Creatinine Clearance: 147 mL/min (by C-G formula based on SCr of 0.92 mg/dL).  No results for input(s): VANCOTROUGH, VANCOPEAK, VANCORANDOM, GENTTROUGH, GENTPEAK, GENTRANDOM, TOBRATROUGH, TOBRAPEAK, TOBRARND, AMIKACINPEAK, AMIKACINTROU, AMIKACIN in the last 72 hours.   Microbiology: No results found for this or any previous visit (from the past 720 hour(s)).  Medical History: Past Medical History:  Diagnosis Date  . Acute angina ([place])   . Arthritis   . Asthma   . CAD (coronary artery disease)    a. nonobstructive CAD by cath in 01/2017  . COPD (chronic obstructive pulmonary disease) ([place])   . Dental caries   . Diabetes mellitus without complication ([place])   . GERD (gastroesophageal reflux disease)   . Headache   .  Hypertension   . Myocardial infarction ([place])   . Seizure disorder ([place])   . Seizures ([place])     Medications:  Scheduled:  . divalproex      . phenytoin  400 mg Oral Q2H   Followed by  . phenytoin  200 mg Oral Once   PRN:   Assessment: Pt with seizure, has not taken phenytoin in 6 months, needs loading dose   Plan:  Preliminary review of pertinent patient information completed.  Protocol will be initiated with dose(s) of 1800mg  loading dose given as 400mg  Q2 hr x 4 doses then 200mg  once.  Forestine Na clinical pharmacist will complete review during morning rounds to assess patient and finalize treatment regimen if needed.  Keisi Eckford Scarlett, RPH 06/24/2018,5:21 AM

## 2018-06-24 NOTE — Discharge Instructions (Signed)
It is very important that you take your seizure medication as prescribed.  Follow-up with your primary doctor as well as the neurologist.  Return to the ED if you develop new or worsening symptoms.

## 2019-12-30 ENCOUNTER — Encounter (HOSPITAL_COMMUNITY): Payer: Self-pay | Admitting: Emergency Medicine

## 2019-12-30 ENCOUNTER — Other Ambulatory Visit: Payer: Self-pay

## 2019-12-30 DIAGNOSIS — Z5321 Procedure and treatment not carried out due to patient leaving prior to being seen by health care provider: Secondary | ICD-10-CM | POA: Insufficient documentation

## 2019-12-30 DIAGNOSIS — G43009 Migraine without aura, not intractable, without status migrainosus: Secondary | ICD-10-CM | POA: Diagnosis present

## 2019-12-30 NOTE — ED Triage Notes (Signed)
Migraine for the last 3 days, denies changes in vision, and sensitivity to light.

## 2019-12-31 ENCOUNTER — Emergency Department (HOSPITAL_COMMUNITY)
Admission: EM | Admit: 2019-12-31 | Discharge: 2019-12-31 | Disposition: A | Discharge status: Home / Self Care | Payer: Medicare Other | Attending: Emergency Medicine | Admitting: Emergency Medicine

## 2020-08-31 ENCOUNTER — Other Ambulatory Visit: Payer: Self-pay

## 2020-08-31 ENCOUNTER — Encounter (HOSPITAL_COMMUNITY): Payer: Self-pay

## 2020-08-31 ENCOUNTER — Emergency Department (HOSPITAL_COMMUNITY)
Admission: EM | Admit: 2020-08-31 | Discharge: 2020-08-31 | Disposition: A | Discharge status: Home / Self Care | Payer: Medicare Other | Attending: Emergency Medicine | Admitting: Emergency Medicine

## 2020-08-31 DIAGNOSIS — I10 Essential (primary) hypertension: Secondary | ICD-10-CM | POA: Diagnosis not present

## 2020-08-31 DIAGNOSIS — M542 Cervicalgia: Secondary | ICD-10-CM | POA: Insufficient documentation

## 2020-08-31 DIAGNOSIS — J45909 Unspecified asthma, uncomplicated: Secondary | ICD-10-CM | POA: Insufficient documentation

## 2020-08-31 DIAGNOSIS — F17210 Nicotine dependence, cigarettes, uncomplicated: Secondary | ICD-10-CM | POA: Diagnosis not present

## 2020-08-31 DIAGNOSIS — E119 Type 2 diabetes mellitus without complications: Secondary | ICD-10-CM | POA: Diagnosis not present

## 2020-08-31 DIAGNOSIS — Z955 Presence of coronary angioplasty implant and graft: Secondary | ICD-10-CM | POA: Insufficient documentation

## 2020-08-31 DIAGNOSIS — J449 Chronic obstructive pulmonary disease, unspecified: Secondary | ICD-10-CM | POA: Insufficient documentation

## 2020-08-31 DIAGNOSIS — L03811 Cellulitis of head [any part, except face]: Secondary | ICD-10-CM | POA: Diagnosis not present

## 2020-08-31 DIAGNOSIS — I2510 Atherosclerotic heart disease of native coronary artery without angina pectoris: Secondary | ICD-10-CM | POA: Insufficient documentation

## 2020-08-31 DIAGNOSIS — Z79899 Other long term (current) drug therapy: Secondary | ICD-10-CM | POA: Diagnosis not present

## 2020-08-31 DIAGNOSIS — R220 Localized swelling, mass and lump, head: Secondary | ICD-10-CM | POA: Diagnosis present

## 2020-08-31 MED ORDER — CEPHALEXIN 500 MG PO CAPS
1000.0000 mg | ORAL_CAPSULE | Freq: Once | ORAL | Status: AC
  Administered 2020-08-31: 1000 mg via ORAL
  Filled 2020-08-31: qty 2

## 2020-08-31 MED ORDER — CEPHALEXIN 500 MG PO CAPS: 500.0000 mg | ORAL_CAPSULE | Freq: Four times a day (QID) | ORAL | 0 refills | Status: DC

## 2020-08-31 NOTE — ED Triage Notes (Signed)
Pt ambulatory to er, pt states that he doesn't have any shoes with him, states that a friend dropped him off in his sox.  Pt states that he is here for a bump on his head, states that he has had it for the past few days, but now the pain is radiating down to his ear.  States "I have a hx of seizures, so I didn't want to take any chances"

## 2020-08-31 NOTE — ED Provider Notes (Signed)
[facility] EMERGENCY DEPARTMENT Provider Note   CSN: [id number] Arrival date & time: 08/31/20  0154     History Chief Complaint  Patient presents with  . Mass    Chad Hampton is a 40 y.o. male.  Swollen painful area to the top of his head on the left side.  Patient states been there for few days and also has some left neck pain and swelling as well.  Patient is worried he might have brain cancer since his family has a history of it.  No fevers.  No nausea or vomiting.  No other associated symptoms.         Past Medical History:  Diagnosis Date  . Acute angina ([place])   . Arthritis   . Asthma   . CAD (coronary artery disease)    a. nonobstructive CAD by cath in 01/2017  . COPD (chronic obstructive pulmonary disease) ([place])   . Dental caries   . Diabetes mellitus without complication ([place])   . GERD (gastroesophageal reflux disease)   . Headache   . Hypertension   . Myocardial infarction ([place])   . Seizure disorder ([place])   . Seizures [facility])     Patient Active Problem List   Diagnosis Date Noted  . Tobacco abuse 05/25/2018  . Essential hypertension 05/13/2018  . Hyperlipidemia 05/13/2018  . DM coma, type 2, not at goal [facility]) 06/24/2017  . Seizure ([place]) 06/24/2017  . Abnormal cardiovascular stress test   . MDD (major depressive disorder), recurrent severe, without psychosis ([place]) 05/11/2015  . Cannabis abuse 05/11/2015  . Substance induced mood disorder ([place]) 05/11/2015    Past Surgical History:  Procedure Laterality Date  . CHOLECYSTECTOMY    . EYE SURGERY    . FACIAL RECONSTRUCTION SURGERY    . LEFT HEART CATH AND CORONARY ANGIOGRAPHY N/A 02/07/2017   Procedure: LEFT HEART CATH AND CORONARY ANGIOGRAPHY;  Surgeon: Jettie Booze, MD;  Location: [place] [facility];  Service: Cardiovascular;  Laterality: N/A;  . MULTIPLE EXTRACTIONS WITH ALVEOLOPLASTY N/A 06/06/2017   Procedure: MULTIPLE EXTRACTIONS OF TEETH NUMBERS ONE, SEVENTEEN, EIGHTEEN, NINETEEN,  TWENTY-THREE, TWENTY-FOUR, TWENTY-FIVE, TWENTY-SIX.;  Surgeon: Diona Browner, DDS;  Location: [place];  Service: Oral Surgery;  Laterality: N/A;       Family History  Problem Relation Age of Onset  . Diabetes Mother   . Hypertension Mother   . Heart failure Father   . Stroke Father   . Diabetes Father   . Heart failure Brother     Social History   Tobacco Use  . Smoking status: Current Every Day Smoker    Packs/day: 1.50    Years: 20.00    Pack years: 30.00    Types: Cigarettes  . Smokeless tobacco: Former Systems developer    Types: Snuff  Vaping Use  . Vaping Use: Former  . Start date: 01/05/2014  . Quit date: 04/06/2014  Substance Use Topics  . Alcohol use: Yes    Alcohol/week: 1.0 standard drink    Types: 1 Cans of beer per week    Comment: occasionally  . Drug use: Yes    Types: Marijuana    Comment: daily    Home Medications Prior to Admission medications   Medication Sig Start Date End Date Taking? Authorizing Provider  cephALEXin (KEFLEX) 500 MG capsule Take 1 capsule (500 mg total) by mouth 4 (four) times daily. 08/31/20  Yes Riyanna Crutchley, Corene Cornea, MD  albuterol (PROVENTIL) (2.5 MG/3ML) 0.083% nebulizer solution Take 3 mLs (2.5 mg total)  by nebulization every 6 (six) hours as needed for wheezing or shortness of breath. 07/13/17   Nat Christen, MD  atorvastatin (LIPITOR) 80 MG tablet Take 1 tablet (80 mg total) by mouth daily. 05/27/18 08/25/18  Imogene Burn, PA-C  divalproex (DEPAKOTE) 500 MG DR tablet Take 1 tablet (500 mg total) by mouth 2 (two) times daily. 06/24/18   Rancour, Annie Main, MD  phenytoin (DILANTIN) 100 MG ER capsule Take 1 capsule (100 mg total) by mouth 3 (three) times daily. 06/24/18   Rancour, Annie Main, MD    Allergies    Morphine and related, Penicillins, Sulfa antibiotics, Tramadol, and Ibuprofen  Review of Systems   Review of Systems  All other systems reviewed and are negative.   Physical Exam Updated Vital Signs BP 140/81   Pulse 79   Temp 98.4 F (36.9  C) (Oral)   Resp 19   Ht 6' (1.829 m)   Wt 111.1 kg   SpO2 98%   BMI 33.23 kg/m   Physical Exam Vitals and nursing note reviewed.  Constitutional:      Appearance: He is well-developed.  HENT:     Head: Normocephalic and atraumatic.     Nose: No congestion or rhinorrhea.     Mouth/Throat:     Mouth: Mucous membranes are moist.     Pharynx: Oropharynx is clear.  Eyes:     Pupils: Pupils are equal, round, and reactive to light.  Cardiovascular:     Rate and Rhythm: Normal rate.  Pulmonary:     Effort: Pulmonary effort is normal. No respiratory distress.  Abdominal:     General: There is no distension.  Musculoskeletal:        General: Normal range of motion.     Cervical back: Normal range of motion.  Skin:    General: Skin is dry.     Comments: Tender swollen area to crown of head without fluctuance, erythema or drainage.   Neurological:     General: No focal deficit present.     Mental Status: He is alert.     ED Results / Procedures / Treatments   Labs (all labs ordered are listed, but only abnormal results are displayed) Labs Reviewed - No data to display  EKG None  Radiology No results found.  Procedures Procedures   Medications Ordered in ED Medications  cephALEXin (KEFLEX) capsule 1,000 mg (1,000 mg Oral Given 08/31/20 0430)    ED Course  I have reviewed the triage vital signs and the nursing notes.  Pertinent labs & imaging results that were available during my care of the patient were reviewed by me and considered in my medical decision making (see chart for details).    MDM Rules/Calculators/A&P                          Likely cellulitis with lymphadenopathy in anterior cervical chain of neck. No e/o abscess.   Final Clinical Impression(s) / ED Diagnoses Final diagnoses:  Cellulitis of head except face    Rx / DC Orders ED Discharge Orders         Ordered    cephALEXin (KEFLEX) 500 MG capsule  4 times daily        08/31/20 0409            Garcia Dalzell, Corene Cornea, MD 08/31/20 [phone]

## 2020-08-31 NOTE — ED Triage Notes (Signed)
Pt on his phone throughout the entire triage process

## 2021-09-21 ENCOUNTER — Emergency Department (HOSPITAL_COMMUNITY): Payer: Medicare Other

## 2021-09-21 ENCOUNTER — Emergency Department (HOSPITAL_COMMUNITY)
Admission: EM | Admit: 2021-09-21 | Discharge: 2021-09-21 | Disposition: A | Discharge status: Home / Self Care | Payer: Medicare Other | Attending: Emergency Medicine | Admitting: Emergency Medicine

## 2021-09-21 ENCOUNTER — Other Ambulatory Visit: Payer: Self-pay

## 2021-09-21 ENCOUNTER — Encounter (HOSPITAL_COMMUNITY): Payer: Self-pay

## 2021-09-21 DIAGNOSIS — D1720 Benign lipomatous neoplasm of skin and subcutaneous tissue of unspecified limb: Secondary | ICD-10-CM

## 2021-09-21 DIAGNOSIS — D1723 Benign lipomatous neoplasm of skin and subcutaneous tissue of right leg: Secondary | ICD-10-CM | POA: Diagnosis not present

## 2021-09-21 DIAGNOSIS — M79671 Pain in right foot: Secondary | ICD-10-CM

## 2021-09-21 MED ORDER — ACETAMINOPHEN 325 MG PO TABS
650.0000 mg | ORAL_TABLET | Freq: Once | ORAL | Status: AC
  Administered 2021-09-21: 650 mg via ORAL
  Filled 2021-09-21: qty 2

## 2021-09-21 MED ORDER — ACETAMINOPHEN ER 650 MG PO TBCR: 650.0000 mg | EXTENDED_RELEASE_TABLET | Freq: Three times a day (TID) | ORAL | 0 refills | Status: AC | PRN

## 2021-09-21 NOTE — ED Provider Notes (Signed)
[facility] Provider Note   CSN: [id number] Arrival date & time: 09/21/21  1545     History {Add pertinent medical, surgical, social history, OB history to HPI:1} Chief Complaint  Patient presents with   Foot Pain    Chad Hampton is a 41 y.o. male with chief complaint of right foot pain.  Diagnosed 3 years ago with a lipoma of the sole of the right foot.  Size fluctuates on and off with no clear trigger.  Over the last 2 days, pain has been so significant due to increase in size that the patient is unable to walk.  Denies fever, discharge, recent infection, new numbness or tingling of the toes of that foot.  Endorses chronic glove style numbness of the lower extremities due to Hx of diabetes mellitus.  Also Hx of gout, but without any toe or joint swelling.  Denies recent injury to the foot.  The history is provided by the patient and medical records.  Foot Pain      Home Medications Prior to Admission medications   Medication Sig Start Date End Date Taking? Authorizing Provider  acetaminophen (TYLENOL 8 HOUR) 650 MG CR tablet Take 1 tablet (650 mg total) by mouth every 8 (eight) hours as needed for up to 10 days for pain. 09/21/21 10/01/21 Yes Prince Rome, PA-C  albuterol (PROVENTIL) (2.5 MG/3ML) 0.083% nebulizer solution Take 3 mLs (2.5 mg total) by nebulization every 6 (six) hours as needed for wheezing or shortness of breath. 07/13/17   Nat Christen, MD  atorvastatin (LIPITOR) 80 MG tablet Take 1 tablet (80 mg total) by mouth daily. 05/27/18 08/25/18  Imogene Burn, PA-C  divalproex (DEPAKOTE) 500 MG DR tablet Take 1 tablet (500 mg total) by mouth 2 (two) times daily. 06/24/18   Rancour, Annie Main, MD  phenytoin (DILANTIN) 100 MG ER capsule Take 1 capsule (100 mg total) by mouth 3 (three) times daily. 06/24/18   Rancour, Annie Main, MD      Allergies    Morphine and related, Penicillins, Sulfa antibiotics, Tramadol, and Ibuprofen    Review of Systems    Review of Systems  Musculoskeletal:        Right foot pain, mass of the right foot   Physical Exam Updated Vital Signs BP (!) 139/91 (BP Location: Right Arm)   Pulse 90   Temp 97.9 F (36.6 C) (Oral)   Resp 18   Ht 6' (1.829 m)   Wt 126.6 kg   SpO2 98%   BMI 37.84 kg/m  Physical Exam Vitals and nursing note reviewed.  Constitutional:      General: He is not in acute distress.    Appearance: He is well-developed.  HENT:     Head: Normocephalic and atraumatic.  Eyes:     Conjunctiva/sclera: Conjunctivae normal.  Cardiovascular:     Rate and Rhythm: Normal rate and regular rhythm.     Pulses: Normal pulses.     Heart sounds: No murmur heard. Pulmonary:     Effort: Pulmonary effort is normal. No respiratory distress.     Breath sounds: Normal breath sounds.  Abdominal:     Palpations: Abdomen is soft.     Tenderness: There is no abdominal tenderness.  Musculoskeletal:        General: Tenderness present. No swelling.     Cervical back: Neck supple.       Feet:  Feet:     Comments: 1.5 cm palpable mass that is mobile, with smooth  borders, and elicits tender upon palpation.  No evidence of surrounding erythema, swelling, ecchymosis, or recent trauma.  2+ DP and PT pulses.  CRT less than 2 seconds to all toes and both feet.  Bilateral feet otherwise unremarkable. Skin:    General: Skin is warm and dry.     Capillary Refill: Capillary refill takes less than 2 seconds.  Neurological:     Mental Status: He is alert and oriented to person, place, and time.  Psychiatric:        Mood and Affect: Mood normal.    ED Results / Procedures / Treatments   Labs (all labs ordered are listed, but only abnormal results are displayed) Labs Reviewed - No data to display  EKG None  Radiology DG Foot Complete Right  Result Date: 09/21/2021 CLINICAL DATA:  Acute right foot pain. EXAM: RIGHT FOOT COMPLETE - 3+ VIEW COMPARISON:  None Available. FINDINGS: There is no evidence of  fracture or dislocation. There is no evidence of arthropathy or other focal bone abnormality. Soft tissues are unremarkable. IMPRESSION: Negative. Electronically Signed   By: Marijo Conception M.D.   On: 09/21/2021 16:46    Procedures Procedures  {Document cardiac monitor, telemetry assessment procedure when appropriate:1}  Medications Ordered in ED Medications  acetaminophen (TYLENOL) tablet 650 mg (has no administration in time range)    ED Course/ Medical Decision Making/ A&P                           Medical Decision Making Amount and/or Complexity of Data Reviewed External Data Reviewed: notes. Labs: ordered. Decision-making details documented in ED Course. Radiology: ordered and independent interpretation performed. Decision-making details documented in ED Course. ECG/medicine tests: ordered and independent interpretation performed. Decision-making details documented in ED Course.  Risk OTC drugs. Prescription drug management.   41 y.o. male presents to the ED for concern of Foot Pain   This involves an extensive number of treatment options, and is a complaint that carries with it a high risk of complications and morbidity.  The emergent differential diagnosis prior to evaluation includes, but is not limited to: Lipoma, abscess, gout, fracture, dislocation, ischemia  This is not an exhaustive differential.   Past Medical History / Co-morbidities / Social History: Hx of DM type II, seizure disorder, prior MI, arthritis, gout, asthma, GERD, HTN, COPD, CAD Social Determinants of Health include poor medical compliance, for which medication regimen adherence was recommended  Additional History:  Internal and external records from outside source obtained and reviewed including ED visits, cardiology  Physical Exam: Physical exam performed. The pertinent findings include: Mass on the sole of the right foot with smooth borders, is mobile, and tender to palpation.  Lab  Tests: None  Imaging Studies: I ordered imaging studies including x-ray of the right foot.  I independently visualized and interpreted said imaging.  Pertinent results include: No acute bony abnormality such as fracture or dislocation I agree with the radiologist interpretation.  Medications: I ordered medication including Tylenol for pain relief.  Reevaluation of the patient after these medicines showed that the patient tolerated this well and has mild-moderate improvement.  I have reviewed the patients home medicines and have made adjustments as needed  ED Course/Disposition: Pt well-appearing on exam.  Acute on chronic right foot pain.  Diagnosed with a lipoma of the right sole of the foot 3 years ago.  Over the last 2 days pain is increased as well as size  per patient.  Unable to take ibuprofen due to rash adverse reaction.  No recent trauma.  Lower extremities appear neurovascularly intact.  Patient X-Ray negative for obvious fracture or dislocation.  Physical exam highly suggestive of lipoma without evidence of infection or rupture.  Not suspicious of DVT, gout, septic arthritis, or abscess.  Pain managed in ED.  Pt advised to follow up with foot specialist for further evaluation and possible surgical intervention.  Pt given crutches and postop shoe while in ED.  Conservative therapy recommended and discussed.  Pt is in NAD and in good condition at time of discharge.  After consideration of the diagnostic results and the patient's encounter today, I feel that the emergency department workup does not suggest an emergent condition requiring admission or immediate intervention beyond what has been performed at this time.  The patient is safe for discharge and has been instructed to return immediately for worsening symptoms, change in symptoms or any other concerns.  Discussed course of treatment thoroughly with the patient, whom demonstrated understanding.  Patient in agreement and has no further  questions.  I discussed this case with my attending physician Dr. Melina Copa, who agreed with the proposed treatment course and cosigned this note including patient's presenting symptoms, physical exam, and planned diagnostics and interventions.  Attending physician stated agreement with plan or made changes to plan which were implemented.     This chart was dictated using voice recognition software.  Despite best efforts to proofread, errors can occur which can change the documentation meaning.   {Document critical care time when appropriate:1} {Document review of labs and clinical decision tools ie heart score, Chads2Vasc2 etc:1}  {Document your independent review of radiology images, and any outside records:1} {Document your discussion with family members, caretakers, and with consultants:1} {Document social determinants of health affecting pt's care:1} {Document your decision making why or why not admission, treatments were needed:1} Final Clinical Impression(s) / ED Diagnoses Final diagnoses:  Lipoma of foot  Foot pain, right    Rx / DC Orders ED Discharge Orders          Ordered    acetaminophen (TYLENOL 8 HOUR) 650 MG CR tablet  Every 8 hours PRN        09/21/21 1933

## 2021-09-21 NOTE — ED Triage Notes (Signed)
Pt c/o R foot pain x "a couple days."  Pain score 7/10.  Pain is from arch to toes.  Hx of gout.  Denies injury.

## 2021-09-21 NOTE — Discharge Instructions (Addendum)
Contact information for the [facility] has been provided for you.  Please call and schedule an appointment to follow-up with Dr. Amalia Hailey for continued medical management and reevaluation.  A prescription for Tylenol has been sent to your pharmacy.  You may take 1 tablet every 8 hours as needed for pain.  Return to the ED for new or worsening symptoms as discussed.

## 2021-09-26 ENCOUNTER — Encounter: Payer: Self-pay | Admitting: Podiatry

## 2021-09-26 ENCOUNTER — Ambulatory Visit (INDEPENDENT_AMBULATORY_CARE_PROVIDER_SITE_OTHER): Payer: Medicare Other

## 2021-09-26 ENCOUNTER — Ambulatory Visit (INDEPENDENT_AMBULATORY_CARE_PROVIDER_SITE_OTHER): Payer: Medicare Other | Admitting: Podiatry

## 2021-09-26 DIAGNOSIS — M722 Plantar fascial fibromatosis: Secondary | ICD-10-CM

## 2021-09-26 DIAGNOSIS — D492 Neoplasm of unspecified behavior of bone, soft tissue, and skin: Secondary | ICD-10-CM | POA: Diagnosis not present

## 2021-09-26 MED ORDER — TRIAMCINOLONE ACETONIDE 10 MG/ML IJ SUSP
10.0000 mg | Freq: Once | INTRAMUSCULAR | Status: AC
  Administered 2021-09-26: 10 mg

## 2021-09-26 NOTE — Progress Notes (Signed)
Subjective:   Patient ID: Chad Hampton, male   DOB: 41 y.o.   MRN: [id number]   HPI Patient presents stating he has developed pain that really increased over the last couple weeks to the point he cannot bear weight down on the right foot and arch.  States has had a nodule there for 3 to 4 years its not changed at all in size but it has becomes suddenly become discomforting with inflammation of the band of the fascia.  Patient smokes a pack and a half of cigarettes per day and does have a relative high anxiety moderate obesity   Review of Systems  All other systems reviewed and are negative.      Objective:  Physical Exam Vitals and nursing note reviewed.  Constitutional:      Appearance: He is well-developed.  Pulmonary:     Effort: Pulmonary effort is normal.  Musculoskeletal:        General: Normal range of motion.  Skin:    General: Skin is warm.  Neurological:     Mental Status: He is alert.    Neurovascular status found to be intact muscle strength found to be adequate range of motion mildly reduced subtalar joint intact midtarsal joint MPJs.  Patient has a small inflammatory nodule around the medial arch right measuring about 8 x 8 mm with inflammation and quite a bit of discomfort in the medial fascial band inability to bear weight on the foot due to the intense discomfort.  Good digital perfusion well oriented x3    Assessment:  Acute inflammatory condition right fascia along with the possibility that the soft tissue may be involved even though its been the same for the last 4 years H and     Plan:  P reviewed condition and went ahead today and I did do a fascial injection 3 mg dexamethasone Kenalog 5 mg Xylocaine to reduce inflammation and applied air fracture walker to completely immobilize along with heat ice therapy.  It was fitted properly to his foot and lower leg and all instructions given on wearing it and I will want to check him back again ultimately may require  MRI or excision if this were to remain a problem or any other changes were to occur  X-rays did not indicate that this is calcified it appears to be soft tissue I did not note any other pathology of the structure of the bone

## 2021-09-26 NOTE — Progress Notes (Signed)
G

## 2021-10-17 ENCOUNTER — Encounter: Payer: Self-pay | Admitting: Podiatry

## 2021-10-17 ENCOUNTER — Ambulatory Visit (INDEPENDENT_AMBULATORY_CARE_PROVIDER_SITE_OTHER): Payer: Medicare Other | Admitting: Podiatry

## 2021-10-17 DIAGNOSIS — M722 Plantar fascial fibromatosis: Secondary | ICD-10-CM | POA: Diagnosis not present

## 2021-10-17 DIAGNOSIS — D492 Neoplasm of unspecified behavior of bone, soft tissue, and skin: Secondary | ICD-10-CM

## 2021-10-17 MED ORDER — TRIAMCINOLONE ACETONIDE 10 MG/ML IJ SUSP
10.0000 mg | Freq: Once | INTRAMUSCULAR | Status: AC
  Administered 2021-10-17: 10 mg

## 2021-10-17 NOTE — Progress Notes (Signed)
Subjective:   Patient ID: Chad Hampton, male   DOB: 41 y.o.   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  Reappoint in 4 to 6 weeks if symptoms persist

## 2021-11-30 ENCOUNTER — Ambulatory Visit (INDEPENDENT_AMBULATORY_CARE_PROVIDER_SITE_OTHER): Payer: Medicare Other | Admitting: Podiatry

## 2021-11-30 DIAGNOSIS — D492 Neoplasm of unspecified behavior of bone, soft tissue, and skin: Secondary | ICD-10-CM | POA: Diagnosis not present

## 2021-11-30 DIAGNOSIS — M722 Plantar fascial fibromatosis: Secondary | ICD-10-CM

## 2021-11-30 NOTE — Progress Notes (Signed)
Subjective:   Patient ID: Chad Hampton, male   DOB: 41 y.o.   MRN: [id number]   HPI Patient continues to have pain in his right heel and arch and does have a appears to be a nodule in the medial side of the fascia that is tender and has not responded well to immobilization and other treatments   ROS      Objective:  Physical Exam  Appears to be an acute problem with inflammation of the medial band of the right fascia with possibility for fibroma or other nodule of the fascia     Assessment:  Some form of fasciitis or fibroma formation right     Plan:  Reviewed condition recommended continued ice heat therapy and I did dispense a night splint as the boot is hard for him to wear all the time.  I am sending for an MRI as I am concerned about the nodule and its involvement in the problem and ultimately could require surgery with patient not in great health and does smoke a pack and a half of cigarettes per day with obesity so her desperately get a try to avoid surgical intervention in this case

## 2021-12-09 ENCOUNTER — Other Ambulatory Visit: Payer: Medicare Other

## 2021-12-20 ENCOUNTER — Other Ambulatory Visit: Payer: Medicare Other

## 2021-12-24 ENCOUNTER — Ambulatory Visit
Admission: RE | Admit: 2021-12-24 | Discharge: 2021-12-24 | Disposition: A | Discharge status: Home / Self Care | Payer: Medicare Other | Source: Ambulatory Visit | Attending: Podiatry | Admitting: Podiatry

## 2021-12-24 DIAGNOSIS — M722 Plantar fascial fibromatosis: Secondary | ICD-10-CM

## 2021-12-24 DIAGNOSIS — D492 Neoplasm of unspecified behavior of bone, soft tissue, and skin: Secondary | ICD-10-CM

## 2022-01-01 ENCOUNTER — Telehealth: Payer: Self-pay | Admitting: Podiatry

## 2022-01-01 NOTE — Telephone Encounter (Signed)
Pt called stating he wasn't waiting for his results and that he has called and nobody got back with him.

## 2022-01-03 NOTE — Telephone Encounter (Signed)
Doesn't look like anything to be concerned about. If it is hurting a lot he needs to be seen

## 2022-01-21 ENCOUNTER — Telehealth: Payer: Self-pay | Admitting: Podiatry

## 2022-01-21 NOTE — Telephone Encounter (Signed)
Pt called wanting the mri results states he has left messages and no one has called him back.  Upon checking the chart Dr Paulla Dolly did document that if doesn't look like anything to be concerned about but if still hurting he needs to be seen. I gave pt that information and we scheduled him to see Dr Paulla Dolly 9.28.2023

## 2022-01-24 ENCOUNTER — Encounter: Payer: Self-pay | Admitting: Podiatry

## 2022-01-24 ENCOUNTER — Ambulatory Visit (INDEPENDENT_AMBULATORY_CARE_PROVIDER_SITE_OTHER): Payer: Medicare Other | Admitting: Podiatry

## 2022-01-24 DIAGNOSIS — M722 Plantar fascial fibromatosis: Secondary | ICD-10-CM

## 2022-01-24 DIAGNOSIS — D492 Neoplasm of unspecified behavior of bone, soft tissue, and skin: Secondary | ICD-10-CM

## 2022-01-24 NOTE — Progress Notes (Signed)
Subjective:   Patient ID: Chad Hampton, male   DOB: 41 y.o.   MRN: [id number]   HPI Patient continues to have issues with a nodule in his right arch and is also concerned about cancer with significant family history with numerous members having had cancer.  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  I do not have any other way to get this as it did not respond to trying to aspirate and I did discuss surgical intervention patient wants this and I allowed him to read consent form line by line with ample opportunities for questions with no guarantees that we will be able to resolve this.  He wants surgery and today I dispensed air fracture walker with all instructions on usage and it was fitted properly to his lower leg.  He can start to wear it now in order to reduce the discomfort inflammation prior to procedure all questions answered understands that we may not be able to get this out properly I just do not know until I get in there and that recovery can take 6 months to a year

## 2022-02-06 ENCOUNTER — Telehealth: Payer: Self-pay | Admitting: Internal Medicine

## 2022-02-06 NOTE — Telephone Encounter (Signed)
Pt agreeable to come to [facility] for pre op appt, as no availability in [place] office.  Pt has been scheduled to see Nicholes Rough, [facility] 02/11/22 @ 8:50 at [facility]..

## 2022-02-06 NOTE — Telephone Encounter (Signed)
   Pre-operative Risk Assessment    Patient Name: Chad Hampton  DOB: 1981-03-28 MRN: [id number]      Request for Surgical Clearance    Procedure:   Excision of soft tissue mass on Right Foot  Date of Surgery:  Clearance 02/12/22                                 Surgeon:  Dr. Ila Mcgill Surgeon's Group or Practice Name:   [facility] Phone number:  [phone] Fax number:  [phone]   Type of Clearance Requested:   - Medical  - Pharmacy:  Hold        Type of Anesthesia:  Not Indicated   Additional requests/questions:   Caller stated the patient will need medical and pharmacy clearance for this procedure.  Signed, Heloise Beecham   02/06/2022, 11:08 AM

## 2022-02-06 NOTE — Telephone Encounter (Signed)
Primary Cardiologist:Paula Harrington Challenger, MD  Chart reviewed as part of pre-operative protocol coverage. Because of Shimon R Zweber's past medical history and time since last visit, he/she will require a follow-up visit in order to better assess preoperative cardiovascular risk.  Pre-op covering staff: - Please schedule appointment and call patient to inform them. - Please contact requesting surgeon's office via preferred method (i.e, phone, fax) to inform them of need for appointment prior to surgery.  If applicable, this message will also be routed to pharmacy pool and/or primary cardiologist for input on holding anticoagulant/antiplatelet agent as requested below so that this information is available at time of patient's appointment. No record of patient taking anticoagulation or antiplatelet medications.   Emmaline Life, NP-C  02/06/2022, 11:30 AM [address], [address] Office [phone] Fax [phone]

## 2022-02-09 NOTE — Progress Notes (Signed)
No show

## 2022-02-11 ENCOUNTER — Ambulatory Visit: Payer: Medicare Other | Attending: Physician Assistant | Admitting: Physician Assistant

## 2022-02-11 ENCOUNTER — Telehealth: Payer: Self-pay | Admitting: Urology

## 2022-02-11 ENCOUNTER — Telehealth: Payer: Self-pay | Admitting: Internal Medicine

## 2022-02-11 DIAGNOSIS — I10 Essential (primary) hypertension: Secondary | ICD-10-CM

## 2022-02-11 DIAGNOSIS — Z720 Tobacco use: Secondary | ICD-10-CM

## 2022-02-11 DIAGNOSIS — R9439 Abnormal result of other cardiovascular function study: Secondary | ICD-10-CM

## 2022-02-11 DIAGNOSIS — F1210 Cannabis abuse, uncomplicated: Secondary | ICD-10-CM

## 2022-02-11 DIAGNOSIS — E785 Hyperlipidemia, unspecified: Secondary | ICD-10-CM

## 2022-02-11 NOTE — Telephone Encounter (Signed)
I left a message for Caryl Pina with [facility] that the pt no showed for his appt today. The appt should had been scheduled with a MD as a NEW PT as the pt has not been seen since 04/2018. Left message the pt's surgery will need to be cancelled until he has been seen by cardiology. I will send FYI to surgeon office as well.

## 2022-02-11 NOTE — Telephone Encounter (Signed)
Calling to F/U on Clearance. Pt is scheduled to have procedure tomorrow 10/17. Please advise

## 2022-02-11 NOTE — Telephone Encounter (Signed)
UPDATE: THE PT IS GOING TO NEED A NEW PT APPT AND NOT WITH APP. WAS SCHEDULED WITH APP IN ERROR. PT LAST SEEN  04/2018

## 2022-02-11 NOTE — Telephone Encounter (Signed)
I received a call from carol with [facility] stating that pt didn't show for his appt with them this morning. That he would need to reschedule his appt with them. I called pt to let him know that we would be cxling his sx with Dr. Paulla Dolly on 02/12/22 since we didn't have cardiac clearance. I informed the pt that he would need to contact [facility] to reschedule his appt with them. Then we could reschedule his sx when he has that appt. Told pt that if he had any questions or concerns to give [place] a call. He stated that he understood. I informed Caren Griffins with [facility] and Dr. Paulla Dolly of this change.

## 2022-02-14 ENCOUNTER — Ambulatory Visit: Payer: Medicare Other | Attending: Cardiology | Admitting: Cardiology

## 2022-02-14 ENCOUNTER — Encounter: Payer: Self-pay | Admitting: Cardiology

## 2022-02-14 VITALS — BP 132/88 | HR 77 | Ht 72.0 in | Wt 282.0 lb

## 2022-02-14 DIAGNOSIS — Z01810 Encounter for preprocedural cardiovascular examination: Secondary | ICD-10-CM | POA: Diagnosis present

## 2022-02-14 DIAGNOSIS — Z720 Tobacco use: Secondary | ICD-10-CM

## 2022-02-14 DIAGNOSIS — I2510 Atherosclerotic heart disease of native coronary artery without angina pectoris: Secondary | ICD-10-CM | POA: Diagnosis present

## 2022-02-14 NOTE — Progress Notes (Signed)
Cardiology Office Note  Date: 02/14/2022   ID: CHAMPION Chad Hampton, DOB 05-16-1980, MRN [id number]  PCP:  Lemmie Evens, MD  Cardiologist:  Dorris Carnes, MD Electrophysiologist:  None   Chief Complaint  Patient presents with   Preoperative cardiac evaluation    History of Present Illness: Chad Hampton is a 41 y.o. male patient of Dr. Harrington Challenger seen most recently by Ms. Bonnell Public PA-C in 2020, I reviewed the note.  He is placed on my schedule today as a new patient for preoperative cardiac clearance, I reviewed his records.  He is being considered for excision of soft tissue mass involving the right foot, type of anesthesia not indicated.  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  He states that he experiences chest discomfort when he gets emotionally upset, but not with exertion.  This is not a new symptom and there has been no progression.  Otherwise NYHA class II dyspnea, no palpitations or syncope.  I personally reviewed his ECG today which is normal.  RCRI perioperative cardiac risk index is class I-II with 0.9% chance of major adverse cardiac event anticipated.  He states that he does not take his medications regularly, has a nebulizer treatment available, not on his seizure medications.  His PCP is Dr. Karie Hampton.  Past Medical History:  Diagnosis Date   Arthritis    Asthma    CAD (coronary artery disease)    a. nonobstructive CAD by cath in 01/2017   COPD (chronic obstructive pulmonary disease) ([place])    Dental caries    Diabetes mellitus without complication (HCC)    GERD (gastroesophageal reflux disease)    Headache    Hypertension    Seizure disorder ([place])     Past Surgical History:  Procedure Laterality Date   CHOLECYSTECTOMY     EYE SURGERY     FACIAL RECONSTRUCTION SURGERY     LEFT HEART CATH AND CORONARY ANGIOGRAPHY N/A 02/07/2017   Procedure: LEFT HEART CATH AND CORONARY ANGIOGRAPHY;  Surgeon: Jettie Booze, MD;  Location: [facility];  Service: Cardiovascular;  Laterality: N/A;   MULTIPLE EXTRACTIONS WITH ALVEOLOPLASTY N/A 06/06/2017   Procedure: MULTIPLE EXTRACTIONS OF TEETH NUMBERS ONE, SEVENTEEN, EIGHTEEN, NINETEEN, TWENTY-THREE, TWENTY-FOUR, TWENTY-FIVE, TWENTY-SIX.;  Surgeon: Diona Browner, DDS;  Location: [facility];  Service: Oral Surgery;  Laterality: N/A;    Current Outpatient Medications  Medication Sig Dispense Refill   albuterol (PROVENTIL) (2.5 MG/3ML) 0.083% nebulizer solution Take 3 mLs (2.5 mg total) by nebulization every 6 (six) hours as needed for wheezing or shortness of breath. 75 mL 4   No current facility-administered medications for this visit.   Allergies:  Morphine and related, Penicillins, Sulfa antibiotics, Tramadol, and Ibuprofen   Social History: The patient  reports that he has been smoking cigarettes. He has a 30.00 pack-year smoking history. He has quit using smokeless tobacco.  His smokeless tobacco use included snuff. He reports current alcohol use of about 1.0 standard drink of alcohol per week. He reports current drug use. Drug: Marijuana.   Family History: The patient's family history includes Diabetes in his father and mother; Heart failure in his brother and father; Hypertension in his mother; Stroke in his father.   ROS: No palpitations or syncope.  Physical Exam: VS:  BP 132/88   Pulse 77   Ht 6' (1.829 m)   Wt 282 lb (127.9 kg)   SpO2 97%   BMI 38.25 kg/m , BMI Body  mass index is 38.25 kg/m.  Wt Readings from Last 3 Encounters:  02/14/22 282 lb (127.9 kg)  09/21/21 279 lb (126.6 kg)  08/31/20 245 lb (111.1 kg)    General: Patient appears comfortable at rest. HEENT: Conjunctiva and lids normal. Neck: Supple, no elevated JVP or carotid bruits. Lungs: Clear to auscultation, nonlabored breathing at rest. Cardiac: Regular rate and rhythm, no S3 or significant systolic murmur, no pericardial rub. Abdomen: Soft, nontender, bowel  sounds present. Extremities: No pitting edema, distal pulses 2+. Skin: Warm and dry. Musculoskeletal: No kyphosis. Neuropsychiatric: Alert and oriented x3, affect grossly appropriate.  ECG:  An ECG dated 05/08/2018 was personally reviewed today and demonstrated:  Sinus rhythm with decreased R wave progression.  Recent Labwork: No results found for requested labs within last 365 days.     Component Value Date/Time   CHOL 174 05/25/2018 1400   TRIG 183 (H) 05/25/2018 1400   HDL 25 (L) 05/25/2018 1400   CHOLHDL 7.0 05/25/2018 1400   VLDL 37 05/25/2018 1400   LDLCALC 112 (H) 05/25/2018 1400    Other Studies Reviewed Today:  Echocardiogram 01/28/2017: - Left ventricle: The cavity size was normal. Systolic function was    normal. The estimated ejection fraction was in the range of 60%    to 65%. Wall motion was normal; there were no regional wall    motion abnormalities. Left ventricular diastolic function    parameters were normal.  - Aortic valve: There was mild regurgitation.   Cardiac catheterization 02/07/2017: Ost LAD to Prox LAD lesion, 25 %stenosed. Ost Cx to Prox Cx lesion, 25 %stenosed. The left ventricular systolic function is normal. LV end diastolic pressure is normal. The left ventricular ejection fraction is 55-65% by visual estimate. There is no aortic valve stenosis.   Nonobstructive coronary artery disease.    Assessment and Plan:  1.  Preoperative cardiac evaluation in a 42 year old male with mild nonobstructive CAD by cardiac catheterization in 2018, ongoing tobacco abuse, mild hypertension that is currently untreated, and reportedly type 2 diabetes mellitus and asthma.  ECG is normal today.  He does not report any progressive angina symptoms and describes activities meeting or exceeding 4 METS.  RCRI perioperative cardiac risk index is class I-II, relatively low risk for perioperative cardiac event.  He should be able to proceed with surgery, no further cardiac  testing indicated at this time.  2.  Tobacco abuse and history of asthma/COPD.  He follows with Dr. Karie Hampton.  Has nebulizer treatments available but it does not sound like he uses these consistently.  3.  Reported history of seizure disorder, details not clear.  He is currently not taking any antiepileptic medications.  Does not report any recent seizures.  He follows with Dr. Karie Hampton.  Medication Adjustments/Labs and Tests Ordered: Current medicines are reviewed at length with the patient today.  Concerns regarding medicines are outlined above.   Tests Ordered: Orders Placed This Encounter  Procedures   EKG 12-Lead    Medication Changes: No orders of the defined types were placed in this encounter.   Disposition:  Follow up prn  Signed, Satira Sark, MD, [facility] 02/14/2022 3:11 PM    [facility] at [facility] [address] [place], [place] [address] Phone: [phone]; Fax: [phone]

## 2022-02-14 NOTE — Patient Instructions (Signed)
Medication Instructions:  Your physician recommends that you continue on your current medications as directed. Please refer to the Current Medication list given to you today.   Labwork: None today  Testing/Procedures: None today  Follow-Up: As needed  Any Other Special Instructions Will Be Listed Below (If Applicable).  If you need a refill on your cardiac medications before your next appointment, please call your pharmacy.

## 2022-02-18 ENCOUNTER — Encounter: Payer: Medicare Other | Admitting: Podiatry

## 2022-02-20 ENCOUNTER — Encounter (HOSPITAL_COMMUNITY): Payer: Self-pay

## 2022-02-20 ENCOUNTER — Emergency Department (HOSPITAL_COMMUNITY): Payer: Medicare Other

## 2022-02-20 ENCOUNTER — Emergency Department (HOSPITAL_COMMUNITY)
Admission: EM | Admit: 2022-02-20 | Discharge: 2022-02-21 | Disposition: A | Discharge status: Home / Self Care | Payer: Medicare Other | Attending: Emergency Medicine | Admitting: Emergency Medicine

## 2022-02-20 ENCOUNTER — Other Ambulatory Visit: Payer: Self-pay

## 2022-02-20 DIAGNOSIS — R42 Dizziness and giddiness: Secondary | ICD-10-CM | POA: Diagnosis present

## 2022-02-20 DIAGNOSIS — R112 Nausea with vomiting, unspecified: Secondary | ICD-10-CM | POA: Diagnosis not present

## 2022-02-20 DIAGNOSIS — E860 Dehydration: Secondary | ICD-10-CM | POA: Diagnosis not present

## 2022-02-20 LAB — BASIC METABOLIC PANEL
Anion gap: 8 (ref 5–15)
BUN: 11 mg/dL (ref 6–20)
CO2: 26 mmol/L (ref 22–32)
Calcium: 9 mg/dL (ref 8.9–10.3)
Chloride: 105 mmol/L (ref 98–111)
Creatinine, Ser: 0.76 mg/dL (ref 0.61–1.24)
GFR, Estimated: >60 mL/min (ref >60)
Glucose, Bld: 126 mg/dL — ABNORMAL HIGH (ref 70–99)
Potassium: 3.6 mmol/L (ref 3.5–5.1)
Sodium: 139 mmol/L (ref 135–145)

## 2022-02-20 LAB — CBC
HCT: 49 % (ref 39.0–52.0)
Hemoglobin: 16.9 g/dL (ref 13.0–17.0)
MCH: 31 pg (ref 26.0–34.0)
MCHC: 34.5 g/dL (ref 30.0–36.0)
MCV: 89.7 fL (ref 80.0–100.0)
Platelets: 283 10*3/uL (ref 150–400)
RBC: 5.46 MIL/uL (ref 4.22–5.81)
RDW: 13 % (ref 11.5–15.5)
WBC: 12.1 10*3/uL — ABNORMAL HIGH (ref 4.0–10.5)
nRBC: 0 % (ref 0.0–0.2)

## 2022-02-20 LAB — CBG MONITORING, ED: Glucose-Capillary: 107 mg/dL — ABNORMAL HIGH (ref 70–99)

## 2022-02-20 NOTE — ED Provider Notes (Signed)
[facility] EMERGENCY DEPARTMENT Provider Note   CSN: [id number] Arrival date & time: 02/20/22  2243     History {Add pertinent medical, surgical, social history, OB history to HPI:1} Chief Complaint  Patient presents with   Dizziness    Chad Hampton is a 41 y.o. male.  Patient presents to the emergency department for evaluation of dizziness.  Patient reports that he has been having mild episodes of feeling off balance for the last 4 days but it worsened a couple of hours ago.  He called an ambulance today and reports that when he stood up to get to the ambulance he became acutely more dizzy and had onset of nausea and vomiting.  He reports that the last time he felt like this was when he started a blood pressure medication.  He reports that he is supposed to be on multiple medications but he does not take any of them currently.       Home Medications Prior to Admission medications   Medication Sig Start Date End Date Taking? Authorizing Provider  albuterol (PROVENTIL) (2.5 MG/3ML) 0.083% nebulizer solution Take 3 mLs (2.5 mg total) by nebulization every 6 (six) hours as needed for wheezing or shortness of breath. 07/13/17   Nat Christen, MD      Allergies    Morphine and related, Penicillins, Sulfa antibiotics, Tramadol, and Ibuprofen    Review of Systems   Review of Systems  Physical Exam Updated Vital Signs BP (!) 163/97 (BP Location: Right Arm)   Pulse 97   Temp 98.2 F (36.8 C) (Oral)   Resp 18   Ht 6' (1.829 m)   Wt 127.9 kg   SpO2 97%   BMI 38.25 kg/m  Physical Exam Vitals and nursing note reviewed.  Constitutional:      General: He is not in acute distress.    Appearance: He is well-developed.  HENT:     Head: Normocephalic and atraumatic.     Mouth/Throat:     Mouth: Mucous membranes are moist.  Eyes:     General: Vision grossly intact. Gaze aligned appropriately.     Extraocular Movements: Extraocular movements intact.     Conjunctiva/sclera:  Conjunctivae normal.  Cardiovascular:     Rate and Rhythm: Normal rate and regular rhythm.     Pulses: Normal pulses.     Heart sounds: Normal heart sounds, S1 normal and S2 normal. No murmur heard.    No friction rub. No gallop.  Pulmonary:     Effort: Pulmonary effort is normal. No respiratory distress.     Breath sounds: Normal breath sounds.  Abdominal:     Palpations: Abdomen is soft.     Tenderness: There is no abdominal tenderness. There is no guarding or rebound.     Hernia: No hernia is present.  Musculoskeletal:        General: No swelling.     Cervical back: Full passive range of motion without pain, normal range of motion and neck supple. No pain with movement, spinous process tenderness or muscular tenderness. Normal range of motion.     Right lower leg: No edema.     Left lower leg: No edema.  Skin:    General: Skin is warm and dry.     Capillary Refill: Capillary refill takes less than 2 seconds.     Findings: No ecchymosis, erythema, lesion or wound.  Neurological:     Mental Status: He is alert and oriented to person, place, and time.  GCS: GCS eye subscore is 4. GCS verbal subscore is 5. GCS motor subscore is 6.     Cranial Nerves: Cranial nerves 2-12 are intact.     Sensory: Sensation is intact.     Motor: Motor function is intact. No weakness or abnormal muscle tone.     Coordination: Coordination is intact.  Psychiatric:        Mood and Affect: Mood normal.        Speech: Speech normal.        Behavior: Behavior normal.     ED Results / Procedures / Treatments   Labs (all labs ordered are listed, but only abnormal results are displayed) Labs Reviewed  CBC - Abnormal; Notable for the following components:      Result Value   WBC 12.1 (*)    All other components within normal limits  BASIC METABOLIC PANEL  URINALYSIS, ROUTINE W REFLEX MICROSCOPIC  CBG MONITORING, ED    EKG None  Radiology No results found.  Procedures Procedures   {Document cardiac monitor, telemetry assessment procedure when appropriate:1}  Medications Ordered in ED Medications - No data to display  ED Course/ Medical Decision Making/ A&P                           Medical Decision Making Amount and/or Complexity of Data Reviewed Labs: ordered. Radiology: ordered.   ***  {Document critical care time when appropriate:1} {Document review of labs and clinical decision tools ie heart score, Chads2Vasc2 etc:1}  {Document your independent review of radiology images, and any outside records:1} {Document your discussion with family members, caretakers, and with consultants:1} {Document social determinants of health affecting pt's care:1} {Document your decision making why or why not admission, treatments were needed:1} Final Clinical Impression(s) / ED Diagnoses Final diagnoses:  None    Rx / DC Orders ED Discharge Orders     None

## 2022-02-20 NOTE — ED Notes (Signed)
Urinal provided. Encouraged to urinate. Water given.

## 2022-02-20 NOTE — ED Triage Notes (Signed)
Per Ems pt had dizziness x4days. Vomiting today.   EMS VS CBG 123 BP 160/100 HR 70 O2 90's

## 2022-02-21 LAB — URINALYSIS, ROUTINE W REFLEX MICROSCOPIC
Bilirubin Urine: NEGATIVE
Glucose, UA: NEGATIVE mg/dL
Hgb urine dipstick: NEGATIVE
Ketones, ur: NEGATIVE mg/dL
Leukocytes,Ua: NEGATIVE
Nitrite: NEGATIVE
Protein, ur: 30 mg/dL — AB
Specific Gravity, Urine: 1.024 (ref 1.005–1.030)
pH: 7 (ref 5.0–8.0)

## 2022-02-25 MED ORDER — HYDROCODONE-ACETAMINOPHEN 10-325 MG PO TABS: 1.0000 | ORAL_TABLET | Freq: Three times a day (TID) | ORAL | 0 refills | Status: AC | PRN

## 2022-02-25 NOTE — Addendum Note (Signed)
Addended by: Wallene Huh on: 02/25/2022 03:22 PM   Modules accepted: Orders

## 2022-02-26 DIAGNOSIS — D492 Neoplasm of unspecified behavior of bone, soft tissue, and skin: Secondary | ICD-10-CM

## 2022-02-28 ENCOUNTER — Telehealth: Payer: Self-pay

## 2022-02-28 NOTE — Telephone Encounter (Signed)
Spoke to the patient and he stated he is feeling fine know and his foot has stopped bleeding.

## 2022-02-28 NOTE — Telephone Encounter (Signed)
Follow up on him. He may need dressing reinforced

## 2022-03-01 ENCOUNTER — Encounter: Payer: Self-pay | Admitting: Podiatry

## 2022-03-04 ENCOUNTER — Ambulatory Visit (INDEPENDENT_AMBULATORY_CARE_PROVIDER_SITE_OTHER): Payer: Medicare Other

## 2022-03-04 DIAGNOSIS — D492 Neoplasm of unspecified behavior of bone, soft tissue, and skin: Secondary | ICD-10-CM

## 2022-03-04 NOTE — Progress Notes (Signed)
Patient in office for POV #1 DOS 02/26/2022 EXC SOFT TISSUE MASS RT.   Patient denies nausea, vomiting, fever and chills at this time. Patient reports that 2 suture came out on their own. Incision is still intact. Steri stipes put in place for reinforcement. Sterile dressing reapplied at this time followed by Ace wrap for compression.   Advised patient to continue wearing air fracture walker and monitor area for signs and symptoms of infection. Also advised patient to call the office with any questions, comments, or concerns. Patient verbalized understanding.  Patient to return to office for POV #2 and suture removal.

## 2022-03-07 ENCOUNTER — Other Ambulatory Visit (INDEPENDENT_AMBULATORY_CARE_PROVIDER_SITE_OTHER): Payer: Medicare Other | Admitting: Podiatry

## 2022-03-07 ENCOUNTER — Telehealth: Payer: Self-pay

## 2022-03-07 DIAGNOSIS — D492 Neoplasm of unspecified behavior of bone, soft tissue, and skin: Secondary | ICD-10-CM

## 2022-03-07 DIAGNOSIS — Z9889 Other specified postprocedural states: Secondary | ICD-10-CM

## 2022-03-07 MED ORDER — HYDROCODONE-ACETAMINOPHEN 5-325 MG PO TABS: 1.0000 | ORAL_TABLET | Freq: Four times a day (QID) | ORAL | 0 refills | Status: DC | PRN

## 2022-03-07 NOTE — Progress Notes (Signed)
Rx sent for post op pain med refill

## 2022-03-08 NOTE — Telephone Encounter (Signed)
Patient is aware and stated that he can't tolerate Ibuprofen.

## 2022-03-11 ENCOUNTER — Other Ambulatory Visit: Payer: Self-pay

## 2022-03-11 ENCOUNTER — Encounter (HOSPITAL_COMMUNITY): Payer: Self-pay | Admitting: *Deleted

## 2022-03-11 ENCOUNTER — Emergency Department (HOSPITAL_COMMUNITY)
Admission: EM | Admit: 2022-03-11 | Discharge: 2022-03-11 | Disposition: A | Discharge status: Home / Self Care | Payer: Medicare Other | Attending: Emergency Medicine | Admitting: Emergency Medicine

## 2022-03-11 DIAGNOSIS — F17200 Nicotine dependence, unspecified, uncomplicated: Secondary | ICD-10-CM | POA: Insufficient documentation

## 2022-03-11 DIAGNOSIS — Z4889 Encounter for other specified surgical aftercare: Secondary | ICD-10-CM | POA: Diagnosis not present

## 2022-03-11 DIAGNOSIS — D72829 Elevated white blood cell count, unspecified: Secondary | ICD-10-CM | POA: Insufficient documentation

## 2022-03-11 DIAGNOSIS — E1165 Type 2 diabetes mellitus with hyperglycemia: Secondary | ICD-10-CM | POA: Diagnosis not present

## 2022-03-11 DIAGNOSIS — I10 Essential (primary) hypertension: Secondary | ICD-10-CM | POA: Diagnosis not present

## 2022-03-11 DIAGNOSIS — I2510 Atherosclerotic heart disease of native coronary artery without angina pectoris: Secondary | ICD-10-CM | POA: Diagnosis not present

## 2022-03-11 DIAGNOSIS — J449 Chronic obstructive pulmonary disease, unspecified: Secondary | ICD-10-CM | POA: Insufficient documentation

## 2022-03-11 LAB — CBC WITH DIFFERENTIAL/PLATELET
Abs Immature Granulocytes: 0.03 10*3/uL (ref 0.00–0.07)
Basophils Absolute: 0.1 10*3/uL (ref 0.0–0.1)
Basophils Relative: 1 %
Eosinophils Absolute: 0.3 10*3/uL (ref 0.0–0.5)
Eosinophils Relative: 3 %
HCT: 47.1 % (ref 39.0–52.0)
Hemoglobin: 16.1 g/dL (ref 13.0–17.0)
Immature Granulocytes: 0 %
Lymphocytes Relative: 36 %
Lymphs Abs: 3.9 10*3/uL (ref 0.7–4.0)
MCH: 30.9 pg (ref 26.0–34.0)
MCHC: 34.2 g/dL (ref 30.0–36.0)
MCV: 90.4 fL (ref 80.0–100.0)
Monocytes Absolute: 0.5 10*3/uL (ref 0.1–1.0)
Monocytes Relative: 5 %
Neutro Abs: 5.9 10*3/uL (ref 1.7–7.7)
Neutrophils Relative %: 55 %
Platelets: 265 10*3/uL (ref 150–400)
RBC: 5.21 MIL/uL (ref 4.22–5.81)
RDW: 12.8 % (ref 11.5–15.5)
WBC: 10.7 10*3/uL — ABNORMAL HIGH (ref 4.0–10.5)
nRBC: 0 % (ref 0.0–0.2)

## 2022-03-11 LAB — BASIC METABOLIC PANEL
Anion gap: 9 (ref 5–15)
BUN: 8 mg/dL (ref 6–20)
CO2: 22 mmol/L (ref 22–32)
Calcium: 9 mg/dL (ref 8.9–10.3)
Chloride: 107 mmol/L (ref 98–111)
Creatinine, Ser: 0.67 mg/dL (ref 0.61–1.24)
GFR, Estimated: >60 mL/min (ref >60)
Glucose, Bld: 113 mg/dL — ABNORMAL HIGH (ref 70–99)
Potassium: 3.8 mmol/L (ref 3.5–5.1)
Sodium: 138 mmol/L (ref 135–145)

## 2022-03-11 LAB — CBG MONITORING, ED: Glucose-Capillary: 110 mg/dL — ABNORMAL HIGH (ref 70–99)

## 2022-03-11 NOTE — Discharge Instructions (Signed)
Wound seems to be healed and well no signs of infection at this point in time.  Keep your appointments with podiatry.  Return for any new or worse symptoms.  We will have nursing redressed this with some antibiotic ointment.

## 2022-03-11 NOTE — ED Triage Notes (Addendum)
Pt with burning to right foot starting today this morning, recent surgery to same foot on 02/26/22.  Pt states dsg has been moist and is concerned for infection.

## 2022-03-11 NOTE — ED Provider Notes (Signed)
[facility] EMERGENCY DEPARTMENT Provider Note   CSN: [id number] Arrival date & time: 03/11/22  1945     History  Chief Complaint  Patient presents with   Foot Burn    Chad Hampton is a 41 y.o. male.  Patient status post excision of soft tissue mass from the bottom of his right foot by podiatry [facility] on October 31.  Patient is concerned about wound infection.  Patient states he does not have follow-up again with [facility] until November 22.  Patient states he is got kind of a burning sensation there.  He thought maybe some pus had been expressed from the area.  According to family member that is why he came in.  Denies any increased pain to the area.  Denies any fever or chills.  Denies any redness to the area.  Past medical history is significant for seizure disorder hypertension diabetes COPD coronary artery disease nonobstructive coronary disease on cath by 2018.  Patient is an everyday smoker.       Home Medications Prior to Admission medications   Medication Sig Start Date End Date Taking? Authorizing Provider  albuterol (PROVENTIL) (2.5 MG/3ML) 0.083% nebulizer solution Take 3 mLs (2.5 mg total) by nebulization every 6 (six) hours as needed for wheezing or shortness of breath. 07/13/17   Nat Christen, MD  HYDROcodone-acetaminophen (NORCO) 5-325 MG tablet Take 1 tablet by mouth every 6 (six) hours as needed for moderate pain. 03/07/22   Standiford, Nena Alexander, DPM      Allergies    Morphine and related, Penicillins, Sulfa antibiotics, Tramadol, and Ibuprofen    Review of Systems   Review of Systems  Constitutional:  Negative for chills and fever.  HENT:  Negative for rhinorrhea and sore throat.   Eyes:  Negative for visual disturbance.  Respiratory:  Negative for cough and shortness of breath.   Cardiovascular:  Negative for chest pain and leg swelling.  Gastrointestinal:  Negative for abdominal pain, diarrhea, nausea and vomiting.   Genitourinary:  Negative for dysuria.  Musculoskeletal:  Negative for back pain and neck pain.  Skin:  Positive for wound. Negative for rash.  Neurological:  Negative for dizziness, light-headedness and headaches.  Hematological:  Does not bruise/bleed easily.  Psychiatric/Behavioral:  Negative for confusion.     Physical Exam Updated Vital Signs BP 123/78   Pulse 65   Temp 98.3 F (36.8 C) (Oral)   Resp 18   Ht 1.829 m (6')   Wt 124.7 kg   SpO2 96%   BMI 37.30 kg/m  Physical Exam Vitals and nursing note reviewed.  Constitutional:      General: He is not in acute distress.    Appearance: Normal appearance. He is well-developed. He is not ill-appearing.  HENT:     Head: Normocephalic and atraumatic.  Eyes:     Conjunctiva/sclera: Conjunctivae normal.  Cardiovascular:     Rate and Rhythm: Normal rate and regular rhythm.     Heart sounds: No murmur heard. Pulmonary:     Effort: Pulmonary effort is normal. No respiratory distress.     Breath sounds: Normal breath sounds.  Abdominal:     Palpations: Abdomen is soft.     Tenderness: There is no abdominal tenderness.  Musculoskeletal:        General: No swelling.     Cervical back: Neck supple.     Comments: Foot sort of in the arch area with 2 surgical incisions first 1 measuring  about 4 cm L1 measuring about 2 cm with sutures in place.  Appears well-healed.  No tenderness no erythema no increased warmth.  Dorsalis pedis pulses 2+ good cap refill to all toes.  Sensation intact.  No ankle swelling.  No red streaking going up the foot or into the leg.  Skin:    General: Skin is warm and dry.     Capillary Refill: Capillary refill takes less than 2 seconds.  Neurological:     General: No focal deficit present.     Mental Status: He is alert and oriented to person, place, and time.  Psychiatric:        Mood and Affect: Mood normal.     ED Results / Procedures / Treatments   Labs (all labs ordered are listed, but only  abnormal results are displayed) Labs Reviewed  CBC WITH DIFFERENTIAL/PLATELET - Abnormal; Notable for the following components:      Result Value   WBC 10.7 (*)    All other components within normal limits  BASIC METABOLIC PANEL - Abnormal; Notable for the following components:   Glucose, Bld 113 (*)    All other components within normal limits  CBG MONITORING, ED - Abnormal; Notable for the following components:   Glucose-Capillary 110 (*)    All other components within normal limits    EKG None  Radiology No results found.  Procedures Procedures    Medications Ordered in ED Medications - No data to display  ED Course/ Medical Decision Making/ A&P                           Medical Decision Making Amount and/or Complexity of Data Reviewed Labs: ordered.   Wound appears to be healing well no signs of infection at this time.  Patient CBC White blood cell count is 10.7.  Temp was 98.3.  So no fevers.  Basic metabolic panel blood sugar 113 electrolytes otherwise normal.  No significant tenderness do not feel that we need an x-ray of the foot.  Patient can follow back up with [facility].  We will redress the of the wound and apply antibiotic ointment.  Overall it seems to be healing well with no signs of infection.  Final Clinical Impression(s) / ED Diagnoses Final diagnoses:  Encounter for post surgical wound check    Rx / DC Orders ED Discharge Orders     None         Fredia Sorrow, MD 03/11/22 2259

## 2022-03-20 ENCOUNTER — Ambulatory Visit (INDEPENDENT_AMBULATORY_CARE_PROVIDER_SITE_OTHER): Payer: Medicare Other

## 2022-03-20 DIAGNOSIS — Z9889 Other specified postprocedural states: Secondary | ICD-10-CM

## 2022-03-20 NOTE — Progress Notes (Signed)
Patient in office for POV #2 DOS 02/26/2022 EXC SOFT TISSUE MASS RT.   Patient denies nausea,vomiting, fever and chills. No concerns voiced by patient. Sutures were removed without complication. Patient tolerated suture removal well. At this time patient has already transitioned into a supportive walking shoe from the air fracture walker.    Patient does not needed any future appointments concerning this matter unless a problem arise. Advised patient to call the office with any questions, comments, or concerns. Patient verbalized understanding.

## 2022-05-07 ENCOUNTER — Emergency Department (HOSPITAL_COMMUNITY): Payer: No Typology Code available for payment source

## 2022-05-07 ENCOUNTER — Other Ambulatory Visit: Payer: Self-pay

## 2022-05-07 ENCOUNTER — Encounter (HOSPITAL_COMMUNITY): Payer: Self-pay | Admitting: Emergency Medicine

## 2022-05-07 ENCOUNTER — Emergency Department (HOSPITAL_COMMUNITY)
Admission: EM | Admit: 2022-05-07 | Discharge: 2022-05-07 | Disposition: A | Discharge status: Home / Self Care | Payer: No Typology Code available for payment source | Attending: Emergency Medicine | Admitting: Emergency Medicine

## 2022-05-07 DIAGNOSIS — F17210 Nicotine dependence, cigarettes, uncomplicated: Secondary | ICD-10-CM | POA: Diagnosis not present

## 2022-05-07 DIAGNOSIS — Z7951 Long term (current) use of inhaled steroids: Secondary | ICD-10-CM | POA: Diagnosis not present

## 2022-05-07 DIAGNOSIS — I10 Essential (primary) hypertension: Secondary | ICD-10-CM | POA: Diagnosis not present

## 2022-05-07 DIAGNOSIS — I2510 Atherosclerotic heart disease of native coronary artery without angina pectoris: Secondary | ICD-10-CM | POA: Diagnosis not present

## 2022-05-07 DIAGNOSIS — R062 Wheezing: Secondary | ICD-10-CM | POA: Diagnosis not present

## 2022-05-07 DIAGNOSIS — Y92410 Unspecified street and highway as the place of occurrence of the external cause: Secondary | ICD-10-CM | POA: Insufficient documentation

## 2022-05-07 DIAGNOSIS — J449 Chronic obstructive pulmonary disease, unspecified: Secondary | ICD-10-CM | POA: Insufficient documentation

## 2022-05-07 DIAGNOSIS — M545 Low back pain, unspecified: Secondary | ICD-10-CM | POA: Diagnosis present

## 2022-05-07 DIAGNOSIS — Z79899 Other long term (current) drug therapy: Secondary | ICD-10-CM | POA: Insufficient documentation

## 2022-05-07 DIAGNOSIS — E119 Type 2 diabetes mellitus without complications: Secondary | ICD-10-CM | POA: Diagnosis not present

## 2022-05-07 MED ORDER — LIDOCAINE 5 % EX PTCH: 1.0000 | MEDICATED_PATCH | CUTANEOUS | 0 refills | Status: DC

## 2022-05-07 MED ORDER — ACETAMINOPHEN 325 MG PO TABS
650.0000 mg | ORAL_TABLET | Freq: Once | ORAL | Status: AC
  Administered 2022-05-07: 650 mg via ORAL
  Filled 2022-05-07: qty 2

## 2022-05-07 MED ORDER — ACETAMINOPHEN 325 MG PO TABS: 650.0000 mg | ORAL_TABLET | Freq: Once | ORAL | Status: DC

## 2022-05-07 MED ORDER — CYCLOBENZAPRINE HCL 10 MG PO TABS: 10.0000 mg | ORAL_TABLET | Freq: Two times a day (BID) | ORAL | 0 refills | Status: DC | PRN

## 2022-05-07 MED ORDER — LIDOCAINE 5 % EX PTCH
1.0000 | MEDICATED_PATCH | CUTANEOUS | Status: DC
  Administered 2022-05-07: 1 via TRANSDERMAL
  Filled 2022-05-07: qty 1

## 2022-05-07 MED ORDER — CYCLOBENZAPRINE HCL 10 MG PO TABS: 5.0000 mg | ORAL_TABLET | Freq: Once | ORAL | Status: DC
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
## 2022-05-07 NOTE — Discharge Instructions (Signed)
Note the workup at the emergency department today was overall reassuring.  Imaging of her back showed no acute fracture or dislocation.  As discussed, will treat symptoms at home with Tylenol, muscle laxer as needed as well as numbing patch.  Note the muscle relaxers can cause drowsiness so please do not drive while taking the medication.  I recommend taking in the evening and to realize its effects on you.  Recommend follow-up with primary care for reassessment within the week.  Please do not hesitate to return to emergency department if the worrisome signs and symptoms we discussed become apparent.

## 2022-06-26 ENCOUNTER — Emergency Department (HOSPITAL_COMMUNITY)
Admission: EM | Admit: 2022-06-26 | Discharge: 2022-06-26 | Disposition: A | Discharge status: Home / Self Care | Payer: Medicare Other | Attending: Emergency Medicine | Admitting: Emergency Medicine

## 2022-06-26 ENCOUNTER — Other Ambulatory Visit: Payer: Self-pay

## 2022-06-26 ENCOUNTER — Encounter (HOSPITAL_COMMUNITY): Payer: Self-pay | Admitting: Emergency Medicine

## 2022-06-26 DIAGNOSIS — R0981 Nasal congestion: Secondary | ICD-10-CM | POA: Diagnosis present

## 2022-06-26 DIAGNOSIS — L03211 Cellulitis of face: Secondary | ICD-10-CM | POA: Diagnosis not present

## 2022-06-26 MED ORDER — DOXYCYCLINE HYCLATE 100 MG PO CAPS: 100.0000 mg | ORAL_CAPSULE | Freq: Two times a day (BID) | ORAL | 0 refills | Status: DC

## 2022-06-26 NOTE — ED Provider Notes (Signed)
[facility] Provider Note   CSN: [id number] Arrival date & time: 06/26/22  [id number]     History  Chief Complaint  Patient presents with   Nasal Congestion    Chad Hampton is a 42 y.o. male.  Pt complains of pain in the left side of his nose and below his left eye.  Pt reports area feels swollen.  Pt patient points to a tender area to the side of his nose on the left side.  Patient denies any visual changes he reports his eye felt like it was irritated this morning.  Patient denies any fever or chills he has not had any cough or congestion he denies any nasal drainage he has not had any problems with sinuses patient denies any pain in his teeth or in his mouth he denies earache  The history is provided by the patient. No language interpreter was used.       Home Medications Prior to Admission medications   Medication Sig Start Date End Date Taking? Authorizing Provider  albuterol (PROVENTIL) (2.5 MG/3ML) 0.083% nebulizer solution Take 3 mLs (2.5 mg total) by nebulization every 6 (six) hours as needed for wheezing or shortness of breath. 07/13/17   Nat Christen, MD  cyclobenzaprine (FLEXERIL) 10 MG tablet Take 1 tablet (10 mg total) by mouth 2 (two) times daily as needed for muscle spasms. 05/07/22   Wilnette Kales, PA  HYDROcodone-acetaminophen (NORCO) 5-325 MG tablet Take 1 tablet by mouth every 6 (six) hours as needed for moderate pain. 03/07/22   Standiford, Nena Alexander, DPM  lidocaine (LIDODERM) 5 % Place 1 patch onto the skin daily. Remove & Discard patch within 12 hours or as directed by MD 05/07/22   Wilnette Kales, PA      Allergies    Morphine and related, Penicillins, Sulfa antibiotics, Tramadol, and Ibuprofen    Review of Systems   Review of Systems  All other systems reviewed and are negative.   Physical Exam Updated Vital Signs BP 136/78   Pulse 65   Temp 97.8 F (36.6 C) (Oral)   Ht 6' (1.829 m)   Wt 124.7 kg    SpO2 96%   BMI 37.30 kg/m  Physical Exam Vitals and nursing note reviewed.  Constitutional:      Appearance: He is well-developed.  HENT:     Head: Normocephalic.     Nose: Nose normal. No congestion or rhinorrhea.     Comments: Redness and swelling left side of nose    Mouth/Throat:     Mouth: Mucous membranes are moist.  Eyes:     Pupils: Pupils are equal, round, and reactive to light.  Cardiovascular:     Rate and Rhythm: Normal rate.  Pulmonary:     Effort: Pulmonary effort is normal.  Musculoskeletal:        General: Normal range of motion.     Cervical back: Normal range of motion.  Neurological:     General: No focal deficit present.     Mental Status: He is alert and oriented to person, place, and time.     ED Results / Procedures / Treatments   Labs (all labs ordered are listed, but only abnormal results are displayed) Labs Reviewed - No data to display  EKG None  Radiology No results found.  Procedures Procedures    Medications Ordered in ED Medications - No data to display  ED Course/ Medical Decision Making/ A&P  Medical Decision Making Planes of swelling to the left side of his face  Risk Prescription drug management. Decision regarding hospitalization. Risk Details: Discussed symptoms with patient I think he may be developing an early cellulitis.  I will start him on doxycycline.  Patient has been seen before by Dr. Karie Kirks he is advised to see Dr. Karie Kirks for a recheck in 2 days or recheck at urgent care.  He is advised to return to the emergency department if symptoms worsen or change he is advised warm compresses 20 minutes every hour for the next 24 hours while awake           Final Clinical Impression(s) / ED Diagnoses Final diagnoses:  Cellulitis, face    Rx / DC Orders ED Discharge Orders          Ordered    doxycycline (VIBRAMYCIN) 100 MG capsule  2 times daily        06/26/22 1010           An After Visit Summary was printed and given to the patient.     Fransico Meadow, [place] 06/26/22 1010    Margette Fast, MD 06/28/22 0000

## 2022-06-26 NOTE — ED Triage Notes (Signed)
Patient c/o of left sided nose pain that radiates under his eye. No drainage noted. Denies being sick.

## 2022-06-26 NOTE — Discharge Instructions (Addendum)
Recheck with Dr. Karie Kirks or at Urgent care in 2 days  Warm compresses.  Antibiotics as directed

## 2022-10-02 ENCOUNTER — Emergency Department (HOSPITAL_COMMUNITY)
Admission: EM | Admit: 2022-10-02 | Discharge: 2022-10-02 | Disposition: A | Discharge status: Home / Self Care | Payer: 59 | Attending: Emergency Medicine | Admitting: Emergency Medicine

## 2022-10-02 ENCOUNTER — Emergency Department (HOSPITAL_COMMUNITY): Payer: 59

## 2022-10-02 ENCOUNTER — Other Ambulatory Visit: Payer: Self-pay

## 2022-10-02 ENCOUNTER — Encounter (HOSPITAL_COMMUNITY): Payer: Self-pay | Admitting: *Deleted

## 2022-10-02 DIAGNOSIS — R7309 Other abnormal glucose: Secondary | ICD-10-CM | POA: Insufficient documentation

## 2022-10-02 DIAGNOSIS — M109 Gout, unspecified: Secondary | ICD-10-CM | POA: Insufficient documentation

## 2022-10-02 DIAGNOSIS — M25572 Pain in left ankle and joints of left foot: Secondary | ICD-10-CM | POA: Diagnosis present

## 2022-10-02 LAB — CBG MONITORING, ED: Glucose-Capillary: 130 mg/dL — ABNORMAL HIGH (ref 70–99)

## 2022-10-02 MED ORDER — COLCHICINE 0.6 MG PO TABS: 0.6000 mg | ORAL_TABLET | Freq: Every day | ORAL | 0 refills | Status: DC

## 2022-10-02 MED ORDER — PREDNISONE 10 MG (21) PO TBPK: ORAL_TABLET | Freq: Every day | ORAL | 0 refills | Status: DC

## 2022-10-02 MED ORDER — HYDROCODONE-ACETAMINOPHEN 5-325 MG PO TABS: 1.0000 | ORAL_TABLET | Freq: Four times a day (QID) | ORAL | 0 refills | Status: DC | PRN

## 2022-10-02 NOTE — ED Provider Notes (Signed)
[place] EMERGENCY DEPARTMENT AT [facility] Provider Note   CSN: [id number] Arrival date & time: 10/02/22  1457     History  Chief Complaint  Patient presents with   Foot Pain    TRAYE SILVESTRE is a 42 y.o. male.  42 year old male presents today for concern of left ankle pain and swelling that started yesterday morning.  He states he does have history of gout in the contralateral ankle.  Currently not on any medications.  Endorses socially drinking alcohol but has not drank any since [place].  Does endorse consumption of red meat, and cheeses.  Has not been able to bear weight and is using crutches.  No fever.  Without erythema.  Feels similar to his prior episode of gout.  The history is provided by the patient. No language interpreter was used.       Home Medications Prior to Admission medications   Medication Sig Start Date End Date Taking? Authorizing Provider  albuterol (PROVENTIL) (2.5 MG/3ML) 0.083% nebulizer solution Take 3 mLs (2.5 mg total) by nebulization every 6 (six) hours as needed for wheezing or shortness of breath. 07/13/17   Donnetta Hutching, MD  cyclobenzaprine (FLEXERIL) 10 MG tablet Take 1 tablet (10 mg total) by mouth 2 (two) times daily as needed for muscle spasms. 05/07/22   Peter Garter, PA  doxycycline (VIBRAMYCIN) 100 MG capsule Take 1 capsule (100 mg total) by mouth 2 (two) times daily. 06/26/22   Elson Areas, PA-C  HYDROcodone-acetaminophen (NORCO) 5-325 MG tablet Take 1 tablet by mouth every 6 (six) hours as needed for moderate pain. 03/07/22   Standiford, Jenelle Mages, DPM  lidocaine (LIDODERM) 5 % Place 1 patch onto the skin daily. Remove & Discard patch within 12 hours or as directed by MD 05/07/22   Peter Garter, PA      Allergies    Morphine and codeine, Penicillins, Sulfa antibiotics, Tramadol, and Ibuprofen    Review of Systems   Review of Systems  Constitutional:  Negative for chills and fever.  Musculoskeletal:  Positive  for arthralgias.  All other systems reviewed and are negative.   Physical Exam Updated Vital Signs BP (!) 136/91 (BP Location: Right Arm)   Pulse 87   Temp 98.6 F (37 C) (Oral)   Resp 20   Ht 5\' 11"  (1.803 m)   Wt 111.1 kg   SpO2 99%   BMI 34.17 kg/m  Physical Exam Vitals and nursing note reviewed.  Constitutional:      General: He is not in acute distress.    Appearance: Normal appearance. He is not ill-appearing.  HENT:     Head: Normocephalic and atraumatic.     Nose: Nose normal.  Eyes:     Conjunctiva/sclera: Conjunctivae normal.  Cardiovascular:     Rate and Rhythm: Normal rate and regular rhythm.  Pulmonary:     Effort: Pulmonary effort is normal. No respiratory distress.  Musculoskeletal:        General: No deformity.     Comments: Significant tenderness to palpation of the left ankle.  Without erythema.  Obvious swelling noted.  2+ DP pulse present.  Forage motion all toes of the left foot.  Tib-fib without tenderness palpation.  Knee with full range of motion.  Skin:    Findings: No rash.  Neurological:     Mental Status: He is alert.     ED Results / Procedures / Treatments   Labs (all labs ordered are listed,  but only abnormal results are displayed) Labs Reviewed  CBG MONITORING, ED - Abnormal; Notable for the following components:      Result Value   Glucose-Capillary 130 (*)    All other components within normal limits    EKG None  Radiology DG Ankle Complete Left  Result Date: 10/02/2022 CLINICAL DATA:  Pain and swelling EXAM: LEFT ANKLE COMPLETE - 3 VIEW COMPARISON:  X-ray 11/16/2017 FINDINGS: Soft tissue swelling about the ankle. No fracture or dislocation. Preserved joint spaces and bone mineralization. Tiny plantar calcaneal spur. IMPRESSION: Soft tissue swelling. Electronically Signed   By: Karen Kays M.D.   On: 10/02/2022 16:28    Procedures Procedures    Medications Ordered in ED Medications - No data to display  ED Course/  Medical Decision Making/ A&P                             Medical Decision Making Amount and/or Complexity of Data Reviewed Radiology: ordered.   42 year old male presents today for evaluation of left ankle pain and swelling since yesterday morning.  He does have history of gout.  He states this feels similar.  However his previous episode was in the contralateral ankle.  Neurovascularly intact.  Significant tenderness to palpation of the left ankle.  Without erythema.  Mild warmth noted.  Afebrile.  X-ray obtained which does show soft tissue swelling.  We had a discussion regarding concern for septic joint and needing to perform arthrocentesis.  I did offer patient arthrocentesis however he deferred.  Patient would like empiric treatment for gout and he will return for any worsening or concerning symptoms.  Will give Ortho follow-up if he does not have improvement.  He is appropriate for discharge.  Discharged in stable condition.  Return precautions discussed.   Final Clinical Impression(s) / ED Diagnoses Final diagnoses:  Acute left ankle pain  Acute gout of left ankle, unspecified cause    Rx / DC Orders ED Discharge Orders          Ordered    predniSONE (STERAPRED UNI-PAK 21 TAB) 10 MG (21) TBPK tablet  Daily        10/02/22 1716    colchicine 0.6 MG tablet  Daily       Note to Pharmacy: For first dose take 2 tablets. 1 hour later take another 1 tablet to complete course.   10/02/22 1716              Marita Kansas, PA-C 10/02/22 1717    Vanetta Mulders, MD 10/03/22 2115

## 2022-10-02 NOTE — Discharge Instructions (Addendum)
Your exam today is consistent with gouty arthritis.  I have sent in prednisone, colchicine into the pharmacy for you.  I have sent in a few doses of pain medication for you as well to keep on hand for severe breakthrough pain.  If you have any worsening or concerning symptoms return to the emergency room.  We discussed concern for septic joint and that this requires sample from the joint since test for the knee infection.  He did not want to do this today.  You will return if you have worsening symptoms.  Steroids can cause your blood sugars to increase.  Closely monitor your blood sugars while you are on steroids.

## 2022-10-02 NOTE — ED Triage Notes (Signed)
Pt c/o pain to left foot that started yesterday; pt denies any obvious injury and states he is diabetic

## 2023-05-05 ENCOUNTER — Other Ambulatory Visit: Payer: Self-pay

## 2023-05-05 ENCOUNTER — Emergency Department (HOSPITAL_COMMUNITY)
Admission: EM | Admit: 2023-05-05 | Discharge: 2023-05-05 | Discharge status: Against Medical Advice | Payer: 59 | Attending: Emergency Medicine | Admitting: Emergency Medicine

## 2023-05-05 ENCOUNTER — Encounter (HOSPITAL_COMMUNITY): Payer: Self-pay | Admitting: Emergency Medicine

## 2023-05-05 DIAGNOSIS — H02844 Edema of left upper eyelid: Secondary | ICD-10-CM | POA: Diagnosis not present

## 2023-05-05 DIAGNOSIS — Z5321 Procedure and treatment not carried out due to patient leaving prior to being seen by health care provider: Secondary | ICD-10-CM | POA: Insufficient documentation

## 2023-05-05 DIAGNOSIS — H5712 Ocular pain, left eye: Secondary | ICD-10-CM | POA: Diagnosis present

## 2023-05-05 NOTE — ED Triage Notes (Signed)
 Pt states he woke this am and had trouble getting left eye open due to pain and swelling. Upper lid swelling noted. Denies blurred vision. Pt c/o knot under right arm pit that comes and goes for about a year now, will pop and drain then go away and come back. Nad.

## 2023-05-05 NOTE — ED Notes (Signed)
 Registration staff reported to this nurse that patient left

## 2023-08-14 ENCOUNTER — Other Ambulatory Visit: Payer: Self-pay

## 2023-08-14 ENCOUNTER — Emergency Department (HOSPITAL_COMMUNITY)
Admission: EM | Admit: 2023-08-14 | Discharge: 2023-08-15 | Disposition: A | Discharge status: Home / Self Care | Attending: Emergency Medicine | Admitting: Emergency Medicine

## 2023-08-14 DIAGNOSIS — Z5321 Procedure and treatment not carried out due to patient leaving prior to being seen by health care provider: Secondary | ICD-10-CM | POA: Insufficient documentation

## 2023-08-14 DIAGNOSIS — G501 Atypical facial pain: Secondary | ICD-10-CM | POA: Insufficient documentation

## 2023-08-14 NOTE — ED Triage Notes (Signed)
 Pt c/o right sided facial pain x2 days, denies any injury.

## 2024-02-14 ENCOUNTER — Emergency Department (HOSPITAL_COMMUNITY)

## 2024-02-14 ENCOUNTER — Other Ambulatory Visit: Payer: Self-pay

## 2024-02-14 ENCOUNTER — Encounter (HOSPITAL_COMMUNITY): Payer: Self-pay

## 2024-02-14 ENCOUNTER — Observation Stay (HOSPITAL_COMMUNITY)
Admission: EM | Admit: 2024-02-14 | Discharge: 2024-02-15 | Disposition: A | Attending: Hospitalist | Admitting: Hospitalist

## 2024-02-14 DIAGNOSIS — Z720 Tobacco use: Secondary | ICD-10-CM

## 2024-02-14 DIAGNOSIS — J324 Chronic pansinusitis: Secondary | ICD-10-CM | POA: Insufficient documentation

## 2024-02-14 DIAGNOSIS — F1290 Cannabis use, unspecified, uncomplicated: Secondary | ICD-10-CM | POA: Insufficient documentation

## 2024-02-14 DIAGNOSIS — R42 Dizziness and giddiness: Secondary | ICD-10-CM | POA: Insufficient documentation

## 2024-02-14 DIAGNOSIS — F1210 Cannabis abuse, uncomplicated: Secondary | ICD-10-CM | POA: Diagnosis not present

## 2024-02-14 DIAGNOSIS — R29818 Other symptoms and signs involving the nervous system: Secondary | ICD-10-CM | POA: Diagnosis not present

## 2024-02-14 DIAGNOSIS — I2510 Atherosclerotic heart disease of native coronary artery without angina pectoris: Secondary | ICD-10-CM | POA: Diagnosis present

## 2024-02-14 DIAGNOSIS — F17210 Nicotine dependence, cigarettes, uncomplicated: Secondary | ICD-10-CM | POA: Insufficient documentation

## 2024-02-14 DIAGNOSIS — E66812 Obesity, class 2: Secondary | ICD-10-CM | POA: Insufficient documentation

## 2024-02-14 DIAGNOSIS — F1910 Other psychoactive substance abuse, uncomplicated: Secondary | ICD-10-CM

## 2024-02-14 DIAGNOSIS — Z6838 Body mass index (BMI) 38.0-38.9, adult: Secondary | ICD-10-CM | POA: Insufficient documentation

## 2024-02-14 DIAGNOSIS — I6501 Occlusion and stenosis of right vertebral artery: Secondary | ICD-10-CM | POA: Diagnosis not present

## 2024-02-14 DIAGNOSIS — J449 Chronic obstructive pulmonary disease, unspecified: Secondary | ICD-10-CM | POA: Diagnosis not present

## 2024-02-14 DIAGNOSIS — Z79899 Other long term (current) drug therapy: Secondary | ICD-10-CM | POA: Diagnosis not present

## 2024-02-14 DIAGNOSIS — R7303 Prediabetes: Secondary | ICD-10-CM | POA: Diagnosis not present

## 2024-02-14 DIAGNOSIS — M542 Cervicalgia: Principal | ICD-10-CM

## 2024-02-14 DIAGNOSIS — F17220 Nicotine dependence, chewing tobacco, uncomplicated: Secondary | ICD-10-CM | POA: Insufficient documentation

## 2024-02-14 DIAGNOSIS — E66813 Obesity, class 3: Secondary | ICD-10-CM | POA: Insufficient documentation

## 2024-02-14 DIAGNOSIS — F1090 Alcohol use, unspecified, uncomplicated: Secondary | ICD-10-CM | POA: Insufficient documentation

## 2024-02-14 DIAGNOSIS — I7774 Dissection of vertebral artery: Principal | ICD-10-CM

## 2024-02-14 LAB — I-STAT CHEM 8, ED
BUN: 11 mg/dL (ref 6–20)
Calcium, Ion: 1.22 mmol/L (ref 1.15–1.40)
Chloride: 105 mmol/L (ref 98–111)
Creatinine, Ser: 1 mg/dL (ref 0.61–1.24)
Glucose, Bld: 122 mg/dL — ABNORMAL HIGH (ref 70–99)
HCT: 49 % (ref 39.0–52.0)
Hemoglobin: 16.7 g/dL (ref 13.0–17.0)
Potassium: 4.2 mmol/L (ref 3.5–5.1)
Sodium: 142 mmol/L (ref 135–145)
TCO2: 24 mmol/L (ref 22–32)

## 2024-02-14 LAB — CBC
HCT: 51.8 % (ref 39.0–52.0)
Hemoglobin: 17.6 g/dL — ABNORMAL HIGH (ref 13.0–17.0)
MCH: 30.8 pg (ref 26.0–34.0)
MCHC: 34 g/dL (ref 30.0–36.0)
MCV: 90.6 fL (ref 80.0–100.0)
Platelets: 287 K/uL (ref 150–400)
RBC: 5.72 MIL/uL (ref 4.22–5.81)
RDW: 13.1 % (ref 11.5–15.5)
WBC: 11.2 K/uL — ABNORMAL HIGH (ref 4.0–10.5)
nRBC: 0 % (ref 0.0–0.2)

## 2024-02-14 LAB — COMPREHENSIVE METABOLIC PANEL WITH GFR
ALT: 31 U/L (ref 0–44)
AST: 25 U/L (ref 15–41)
Albumin: 4.5 g/dL (ref 3.5–5.0)
Alkaline Phosphatase: 105 U/L (ref 38–126)
Anion gap: 13 (ref 5–15)
BUN: 11 mg/dL (ref 6–20)
CO2: 24 mmol/L (ref 22–32)
Calcium: 9.6 mg/dL (ref 8.9–10.3)
Chloride: 104 mmol/L (ref 98–111)
Creatinine, Ser: 0.92 mg/dL (ref 0.61–1.24)
GFR, Estimated: >60 mL/min (ref >60)
Glucose, Bld: 119 mg/dL — ABNORMAL HIGH (ref 70–99)
Potassium: 4.3 mmol/L (ref 3.5–5.1)
Sodium: 141 mmol/L (ref 135–145)
Total Bilirubin: 0.5 mg/dL (ref 0.0–1.2)
Total Protein: 8.3 g/dL — ABNORMAL HIGH (ref 6.5–8.1)

## 2024-02-14 LAB — DIFFERENTIAL
Abs Immature Granulocytes: 0.04 K/uL (ref 0.00–0.07)
Basophils Absolute: 0.1 K/uL (ref 0.0–0.1)
Basophils Relative: 1 %
Eosinophils Absolute: 0.3 K/uL (ref 0.0–0.5)
Eosinophils Relative: 3 %
Immature Granulocytes: 0 %
Lymphocytes Relative: 43 %
Lymphs Abs: 4.8 K/uL — ABNORMAL HIGH (ref 0.7–4.0)
Monocytes Absolute: 0.5 K/uL (ref 0.1–1.0)
Monocytes Relative: 5 %
Neutro Abs: 5.5 K/uL (ref 1.7–7.7)
Neutrophils Relative %: 48 %

## 2024-02-14 LAB — PROTIME-INR
INR: 0.9 (ref 0.8–1.2)
Prothrombin Time: 13 s (ref 11.4–15.2)

## 2024-02-14 LAB — APTT: aPTT: 29 s (ref 24–36)

## 2024-02-14 LAB — ETHANOL: Alcohol, Ethyl (B): <15 mg/dL (ref <15)

## 2024-02-14 MED ORDER — SODIUM CHLORIDE 0.9 % IV SOLN: INTRAVENOUS | Status: DC

## 2024-02-14 MED ORDER — ACETAMINOPHEN 325 MG PO TABS
650.0000 mg | ORAL_TABLET | ORAL | Status: DC | PRN
  Administered 2024-02-15 (×2): 650 mg via ORAL
  Filled 2024-02-14 (×2): qty 2

## 2024-02-14 MED ORDER — CLOPIDOGREL BISULFATE 75 MG PO TABS
75.0000 mg | ORAL_TABLET | Freq: Once | ORAL | Status: AC
  Administered 2024-02-14: 75 mg via ORAL
  Filled 2024-02-14: qty 1

## 2024-02-14 MED ORDER — GADOBUTROL 1 MMOL/ML IV SOLN
10.0000 mL | Freq: Once | INTRAVENOUS | Status: AC | PRN
  Administered 2024-02-14: 10 mL via INTRAVENOUS

## 2024-02-14 MED ORDER — STROKE: EARLY STAGES OF RECOVERY BOOK
Freq: Once | Status: DC
  Filled 2024-02-14: qty 1

## 2024-02-14 MED ORDER — ONDANSETRON HCL 4 MG/2ML IJ SOLN: 4.0000 mg | Freq: Four times a day (QID) | INTRAMUSCULAR | Status: DC | PRN

## 2024-02-14 MED ORDER — IOHEXOL 350 MG/ML SOLN
100.0000 mL | Freq: Once | INTRAVENOUS | Status: AC | PRN
  Administered 2024-02-14: 100 mL via INTRAVENOUS

## 2024-02-14 MED ORDER — ACETAMINOPHEN 650 MG RE SUPP: 650.0000 mg | RECTAL | Status: DC | PRN

## 2024-02-14 MED ORDER — ACETAMINOPHEN 160 MG/5ML PO SOLN: 650.0000 mg | ORAL | Status: DC | PRN

## 2024-02-14 MED ORDER — NICOTINE 14 MG/24HR TD PT24
14.0000 mg | MEDICATED_PATCH | Freq: Every day | TRANSDERMAL | Status: DC
Start: 2024-02-15 — End: 2024-02-15
  Filled 2024-02-14: qty 1

## 2024-02-14 MED ORDER — ASPIRIN 81 MG PO CHEW
324.0000 mg | CHEWABLE_TABLET | Freq: Once | ORAL | Status: AC
  Administered 2024-02-14: 324 mg via ORAL
  Filled 2024-02-14: qty 4

## 2024-02-14 NOTE — ED Notes (Signed)
 CT with contrast in progress at this time.

## 2024-02-14 NOTE — ED Notes (Addendum)
 Pt in CT. CT initiated at  this time, in progress. Tele-neuro signing off at this time. Report received from Triage RN, care assumed. Concern for vertebral dissection with broad ddx. Not code stroke at this time.

## 2024-02-14 NOTE — Plan of Care (Signed)

## 2024-02-14 NOTE — ED Notes (Signed)
 Dr. Cleotilde made aware and is at pt's side at this time.

## 2024-02-14 NOTE — ED Notes (Signed)
 From CT into room 12. No changes. PT alert, NAD, calm, interactive, resps e/u, speaking in clear complete sentences, VSS.

## 2024-02-14 NOTE — ED Notes (Signed)
Taken to MRI via w/c.

## 2024-02-14 NOTE — ED Provider Notes (Signed)
 [facility] AT [facility] Provider Note   CSN: [id number] Arrival date & time: 02/14/24  8365     Patient presents with: No chief complaint on file.   Chad Hampton is a 43 y.o. male.  {Add pertinent medical, surgical, social history, OB history to HPI:32947} HPI This patient is a 43 year old male previously diagnosed with diabetes though he does not take any medications for that, he reports noncompliance, also previously diagnosed with some type of vascular abnormality for which she was supposed to take aspirin  but does not, left heart catheterization in 2018 showed only 25% stenosis of his LAD and his left circumflex, normal systolic function and normal aortic valve, nonobstructive coronary disease.  The patient was in his usual state of health at home when he felt acute onset of a sharp and stabbing pain in the back of his neck in the midline with a abrupt onset of vertigo and vomiting that occurred right after that.  This has been constant for 45 minutes, it is not positional, he has no other neurologic symptoms and states his vision is clear but the room is spinning    Prior to Admission medications   Medication Sig Start Date End Date Taking? Authorizing Provider  albuterol  (PROVENTIL ) (2.5 MG/3ML) 0.083% nebulizer solution Take 3 mLs (2.5 mg total) by nebulization every 6 (six) hours as needed for wheezing or shortness of breath. 07/13/17   Bluford Rogue, MD  colchicine  0.6 MG tablet Take 1 tablet (0.6 mg total) by mouth daily. 10/02/22   Hildegard Loge, PA-C  cyclobenzaprine  (FLEXERIL ) 10 MG tablet Take 1 tablet (10 mg total) by mouth 2 (two) times daily as needed for muscle spasms. 05/07/22   Silver Wonda LABOR, PA  doxycycline  (VIBRAMYCIN ) 100 MG capsule Take 1 capsule (100 mg total) by mouth 2 (two) times daily. 06/26/22   Sofia, Leslie K, PA-C  HYDROcodone -acetaminophen  (NORCO/VICODIN) 5-325 MG tablet Take 1-2 tablets by mouth every 6 (six) hours as needed for  severe pain. 10/02/22   Hildegard Loge, PA-C  lidocaine  (LIDODERM ) 5 % Place 1 patch onto the skin daily. Remove & Discard patch within 12 hours or as directed by MD 05/07/22   Silver Wonda LABOR, PA  predniSONE  (STERAPRED UNI-PAK 21 TAB) 10 MG (21) TBPK tablet Take by mouth daily. Take 6 tabs by mouth daily  for 2 days, then 5 tabs for 2 days, then 4 tabs for 2 days, then 3 tabs for 2 days, 2 tabs for 2 days, then 1 tab by mouth daily for 2 days 10/02/22   Hildegard Loge, PA-C    Allergies: Morphine and codeine , Penicillins, Sulfa antibiotics, Tramadol, and Ibuprofen    Review of Systems  All other systems reviewed and are negative.   Updated Vital Signs BP (!) 153/93 (BP Location: Right Arm)   Pulse 64   Temp 98.6 F (37 C)   Resp 19   Ht 1.803 m (5' 11)   Wt 124.7 kg   SpO2 96%   BMI 38.35 kg/m   Physical Exam Vitals and nursing note reviewed.  Constitutional:      General: He is not in acute distress.    Appearance: He is well-developed.  HENT:     Head: Normocephalic and atraumatic.     Mouth/Throat:     Pharynx: No oropharyngeal exudate.  Eyes:     General: No scleral icterus.       Right eye: No discharge.  Left eye: No discharge.     Conjunctiva/sclera: Conjunctivae normal.     Pupils: Pupils are equal, round, and reactive to light.  Neck:     Thyroid : No thyromegaly.     Vascular: No JVD.  Cardiovascular:     Rate and Rhythm: Normal rate and regular rhythm.     Heart sounds: Normal heart sounds. No murmur heard.    No friction rub. No gallop.  Pulmonary:     Effort: Pulmonary effort is normal. No respiratory distress.     Breath sounds: Normal breath sounds. No wheezing or rales.  Abdominal:     General: Bowel sounds are normal. There is no distension.     Palpations: Abdomen is soft. There is no mass.     Tenderness: There is no abdominal tenderness.  Musculoskeletal:        General: No tenderness. Normal range of motion.     Cervical back: Normal range of  motion and neck supple.     Right lower leg: No edema.     Left lower leg: No edema.  Lymphadenopathy:     Cervical: No cervical adenopathy.  Skin:    General: Skin is warm and dry.     Findings: No erythema or rash.  Neurological:     Mental Status: He is alert.     Coordination: Coordination normal.     Comments: Speech is clear, cranial nerves III through XII are intact, memory is intact, strength is normal in all 4 extremities including grips and strength at the bilateral thighs, knees and ankles to extention and flexion, sensation is intact to light touch and pinprick in all 4 extremities. Coordination as tested by finger-nose-finger is normal, no limb ataxia. Normal gait, normal reflexes at the patellar tendons bilaterally  Psychiatric:        Behavior: Behavior normal.     (all labs ordered are listed, but only abnormal results are displayed) Labs Reviewed - No data to display  EKG: None  Radiology: No results found.  {Document cardiac monitor, telemetry assessment procedure when appropriate:32947} Procedures   Medications Ordered in the ED - No data to display    {Click here for ABCD2, HEART and other calculators REFRESH Note before signing:1}                              Medical Decision Making Amount and/or Complexity of Data Reviewed Labs: ordered. Radiology: ordered.  Risk Prescription drug management.   The patient does not have any visible nystagmus but has had acute onset of neck pain with dizziness suggestive of a vertebral artery dissection or other stroke process.  Code stroke activated at 4:54 PM, the patient has vital signs which are fairly unremarkable with a blood pressure of 153/93, will consult with neurology, his last seen normal was at approximately 4:15 PM approximately 35 minutes prior to my exam    Co morbidities / Chronic conditions that complicate the patient evaluation  Untreated, tobacco use, hypertension, untreated  diabetes   Additional history obtained:  Additional history obtained from EMR External records from outside source obtained and reviewed including prior echocardiogram from 2018, prior heart cath from 2018   Lab Tests:  I Ordered, and personally interpreted labs.  The pertinent results include: No acute findings on CBC or metabolic panel   Imaging Studies ordered:  I ordered imaging studies including CT scan and CT angiogram of the head and the neck I  independently visualized and interpreted imaging which showed some irregularities in the central arteries but no obvious dissection, radiology recommends MRI MRI brain and neck I agree with the radiologist interpretation   Cardiac Monitoring: / EKG:  The patient was maintained on a cardiac monitor.  I personally viewed and interpreted the cardiac monitored which showed an underlying rhythm of: Normal sinus rhythm   Problem List / ED Course / Critical interventions / Medication management  Patient given aspirin  and Plavix I ordered medication including as above Reevaluation of the patient after these medicines showed that the patient continued symptoms I have reviewed the patients home medicines and have made adjustments as needed   Consultations Obtained:  I requested consultation with the vascular surgeon Dr. Gretta who referred me to neurointerventional Dr. Lester who states the patient would qualify for dual antiplatelet therapy but no intervention.  Discussed with Dr. Merrianne of neurology who recommends admission for stroke workup as well,  and discussed lab and imaging findings as well as pertinent plan - they recommend: As above   Social Determinants of Health:  Tobacco use   Test / Admission - Considered:  Will admit    {Document critical care time when appropriate  Document review of labs and clinical decision tools ie CHADS2VASC2, etc  Document your independent review of radiology images and any outside  records  Document your discussion with family members, caretakers and with consultants  Document social determinants of health affecting pt's care  Document your decision making why or why not admission, treatments were needed:32947:::1}   Final diagnoses:  None    ED Discharge Orders     None

## 2024-02-14 NOTE — H&P (Addendum)
 History and Physical    Patient: Chad Hampton FMW:[id number] DOB: [birth year] DOA: 02/14/2024 DOS: the patient was seen and examined on 02/14/2024 PCP: Patient, No Pcp Per  Patient coming from: Home  Chief Complaint: No chief complaint on file.  HPI: Chad Hampton is a 43 y.o. male with medical history significant of COPD, gout, tobacco abuse, prediabetes, substance (THC) abuse who presents to the emergency department due to sudden onset of sharp and stabbing pain in the midline of back of his neck with an associated vomiting, diaphoresis and vertigo that lasted about 45 minutes.  This occurred about 3 hours PTA, he endorsed clear vision, but positive nonpositional room spinning.  ED Course:  In the emergency department, BP was 153/93, other vital signs were within normal range.  Workup in the ED showed normal CBC except for WBC of 11.2 and hemoglobin of 17.6.  BMP was normal except for blood glucose of 119.  Alcohol was undetectable. CT head without contrast showed no acute intracranial abnormality CT angiography of head and neck with perfusion showed no acute LVO. Diminished caliber of the V4 segment of the non-dominant right vertebral artery with irregularity and multifocal stenosis. Findings concerning for dissection. Neurologist (Dr. Merrianne) was consulted and recommended stroke workup and the patient can stay at [facility] per EDP.  Aspirin  and Plavix were given. Neurointerventional (Dr. Lester) was consulted.  Dual antiplatelet therapy was recommended but there was no need for any intervention per EDP Vascular surgeon (Dr. Gretta) consulted also states that no indication for any intervention per EDP. [facility] was asked to admit patient.  Review of Systems: Review of systems as noted in the HPI. All other systems reviewed and are negative.   Past Medical History:  Diagnosis Date   Arthritis    Asthma    CAD (coronary artery disease)    a. nonobstructive CAD by cath in 01/2017   COPD (chronic  obstructive pulmonary disease) (HCC)    Dental caries    Diabetes mellitus without complication (HCC)    GERD (gastroesophageal reflux disease)    Headache    Hypertension    Seizure disorder (HCC)    Past Surgical History:  Procedure Laterality Date   CHOLECYSTECTOMY     EYE SURGERY     FACIAL RECONSTRUCTION SURGERY     LEFT HEART CATH AND CORONARY ANGIOGRAPHY N/A 02/07/2017   Procedure: LEFT HEART CATH AND CORONARY ANGIOGRAPHY;  Surgeon: Dann Candyce RAMAN, MD;  Location: [facility];  Service: Cardiovascular;  Laterality: N/A;   MULTIPLE EXTRACTIONS WITH ALVEOLOPLASTY N/A 06/06/2017   Procedure: MULTIPLE EXTRACTIONS OF TEETH NUMBERS ONE, SEVENTEEN, EIGHTEEN, NINETEEN, TWENTY-THREE, TWENTY-FOUR, TWENTY-FIVE, TWENTY-SIX.;  Surgeon: Sheryle Hamilton, DDS;  Location: [facility] OR;  Service: Oral Surgery;  Laterality: N/A;    Social History:  reports that he has been smoking cigarettes. He has a 30 pack-year smoking history. He has quit using smokeless tobacco.  His smokeless tobacco use included snuff. He reports current alcohol use of about 1.0 standard drink of alcohol per week. He reports current drug use. Drug: Marijuana.   Allergies  Allergen Reactions   Morphine Shortness Of Breath   Penicillins Anaphylaxis and Other (See Comments)    Pt states high severity reaction but unknown since childhood   Sulfa Antibiotics Shortness Of Breath and Other (See Comments)    Bothers my breathing   Tramadol Other (See Comments)    Seizures    Ibuprofen Rash and Dermatitis    Family History  Problem Relation  Age of Onset   Diabetes Mother    Hypertension Mother    Heart failure Father    Stroke Father    Diabetes Father    Heart failure Brother      Prior to Admission medications   Medication Sig Start Date End Date Taking? Authorizing Provider  albuterol  (PROVENTIL ) (2.5 MG/3ML) 0.083% nebulizer solution Take 3 mLs (2.5 mg total) by nebulization every 6 (six) hours as needed for  wheezing or shortness of breath. 07/13/17   Bluford Rogue, MD  colchicine  0.6 MG tablet Take 1 tablet (0.6 mg total) by mouth daily. 10/02/22   Hildegard Loge, PA-C  cyclobenzaprine  (FLEXERIL ) 10 MG tablet Take 1 tablet (10 mg total) by mouth 2 (two) times daily as needed for muscle spasms. 05/07/22   Silver Wonda LABOR, PA  doxycycline  (VIBRAMYCIN ) 100 MG capsule Take 1 capsule (100 mg total) by mouth 2 (two) times daily. 06/26/22   Sofia, Leslie K, PA-C  HYDROcodone -acetaminophen  (NORCO/VICODIN) 5-325 MG tablet Take 1-2 tablets by mouth every 6 (six) hours as needed for severe pain. 10/02/22   Hildegard Loge, PA-C  lidocaine  (LIDODERM ) 5 % Place 1 patch onto the skin daily. Remove & Discard patch within 12 hours or as directed by MD 05/07/22   Silver Wonda LABOR, PA  predniSONE  (STERAPRED UNI-PAK 21 TAB) 10 MG (21) TBPK tablet Take by mouth daily. Take 6 tabs by mouth daily  for 2 days, then 5 tabs for 2 days, then 4 tabs for 2 days, then 3 tabs for 2 days, 2 tabs for 2 days, then 1 tab by mouth daily for 2 days 10/02/22   Hildegard Loge, PA-C    Physical Exam: BP (!) 142/84 (BP Location: Left Arm)   Pulse (!) 58   Temp 97.9 F (36.6 C) (Oral)   Resp 16   Ht 5' 11 (1.803 m)   Wt 115.3 kg   SpO2 99%   BMI 35.45 kg/m   General: 43 y.o. year-old male well developed well nourished in no acute distress.  Alert and oriented x3. HEENT: NCAT, EOMI Neck: Supple, trachea medial Cardiovascular: Regular rate and rhythm with no rubs or gallops.  No thyromegaly or JVD noted.  No lower extremity edema. 2/4 pulses in all 4 extremities. Respiratory: Clear to auscultation with no wheezes or rales. Good inspiratory effort. Abdomen: Soft, nontender nondistended with normal bowel sounds x4 quadrants. Muskuloskeletal: No cyanosis, clubbing or edema noted bilaterally Neuro: CN II-XII intact, strength 5/5 x 4, sensation, reflexes intact Skin: No ulcerative lesions noted or rashes Psychiatry: Judgement and insight appear normal. Mood  is appropriate for condition and setting          Labs on Admission:  Basic Metabolic Panel: Recent Labs  Lab 02/14/24 1658 02/14/24 1705  NA 141 142  K 4.3 4.2  CL 104 105  CO2 24  --   GLUCOSE 119* 122*  BUN 11 11  CREATININE 0.92 1.00  CALCIUM  9.6  --    Liver Function Tests: Recent Labs  Lab 02/14/24 1658  AST 25  ALT 31  ALKPHOS 105  BILITOT 0.5  PROT 8.3*  ALBUMIN 4.5   No results for input(s): LIPASE, AMYLASE in the last 168 hours. No results for input(s): AMMONIA in the last 168 hours. CBC: Recent Labs  Lab 02/14/24 1658 02/14/24 1705  WBC 11.2*  --   NEUTROABS 5.5  --   HGB 17.6* 16.7  HCT 51.8 49.0  MCV 90.6  --   PLT 287  --  Cardiac Enzymes: No results for input(s): CKTOTAL, CKMB, CKMBINDEX, TROPONINI in the last 168 hours.  BNP (last 3 results) No results for input(s): BNP in the last 8760 hours.  ProBNP (last 3 results) No results for input(s): PROBNP in the last 8760 hours.  CBG: No results for input(s): GLUCAP in the last 168 hours.  Radiological Exams on Admission: MR Angiogram Neck W or Wo Contrast Result Date: 02/14/2024 EXAM: MRA Neck Without and with contrast 02/14/2024 09:12:32 PM TECHNIQUE: Multiplanar multisequence MRA of the neck was performed without and with the administration of 10 mL gadobutrol (GADAVIST) 1 MMOL/ML injection. 2D and 3D reformatted images are provided for review. Stenosis of the internal carotid arteries is measured using NASCET criteria. The examination is degraded by motion artifact. COMPARISON: Comparison made with prior study from earlier the same day. CLINICAL HISTORY: Vertigo, central; r/o vert arter disection. FINDINGS: CAROTID ARTERIES: No dissection. No hemodynamically significant stenosis by NASCET criteria. VERTEBRAL ARTERIES: Both vertebral arteries arising from the subclavian arteries. Left vertebral artery strongly dominant, with a diffusely hypoplastic right vertebral artery.  Vertebral arteries are patent with antegrade flow. No evidence for dissection or stenosis by MRA. IMPRESSION: 1. Negative MRA of the neck. No evidence of vertebral artery dissection by MRA. Electronically signed by: Morene Hoard MD 02/14/2024 09:46 PM EDT RP Workstation: [id number]   MR Brain W and Wo Contrast Result Date: 02/14/2024 EXAM: MRI BRAIN WITH AND WITHOUT CONTRAST 02/14/2024 09:13:13 PM TECHNIQUE: Multiplanar multisequence MRI of the head/brain was performed with and without the administration of 10 mL gadobutrol (GADAVIST) 1 MMOL/ML injection. COMPARISON: Prior CT from 02/14/2024. CLINICAL HISTORY: Syncope/presyncope, cerebrovascular cause suspected; posterior circulation. FINDINGS: BRAIN AND VENTRICLES: No acute infarct. No acute intracranial hemorrhage. No mass effect or midline shift. No hydrocephalus. The sella is unremarkable. Normal flow voids. No mass or abnormal enhancement. ORBITS: No acute abnormality. SINUSES: Extensive chronic pansinusitis noted, overall worse on the left. BONES AND SOFT TISSUES: Normal bone marrow signal and enhancement. No acute soft tissue abnormality. IMPRESSION: 1. Normal brain MRI. No acute intracranial abnormality. 2. Extensive chronic pansinusitis, worse on the left. Electronically signed by: Morene Hoard MD 02/14/2024 09:33 PM EDT RP Workstation: [id number]   CT ANGIO HEAD NECK W WO CM W PERF (CODE STROKE) LKW > 6h Result Date: 02/14/2024 EXAM: CTA Head and Neck with Perfusion 02/14/2024 05:12:35 PM TECHNIQUE: CTA of the head and neck was performed without and with the administration of 100 mL of iohexol (OMNIPAQUE) 350 MG/ML injection. 3D postprocessing with multiplanar reconstructions and MIPs was performed to evaluate the vascular anatomy. Cerebral perfusion analysis using computed tomography with contrast administration, including post-processing of parametric maps with determination of cerebral blood flow, cerebral blood volume, mean  transit time and time-to-maximum. Automated exposure control, iterative reconstruction, and/or weight based adjustment of the mA/kV was utilized to reduce the radiation dose to as low as reasonably achievable. COMPARISON: Same day CT head. CLINICAL HISTORY: Neuro deficit, acute, stroke suspected; posterior neck pain, dizziness, vomiting. Pt BIB [facility] from home. Pt had sudden onset of neck pain, room spinning dizziness, and nausea. Pt LKW was 1600. Brief neuro exam in triage shows no unilateral weakness, aphasia, dysarthria, or sensation changes. FINDINGS: CTA NECK: AORTIC ARCH AND ARCH VESSELS: No dissection or arterial injury. No significant stenosis of the brachiocephalic or subclavian arteries. CERVICAL CAROTID ARTERIES: The right carotid artery is patent from the origin to the skull base with no hemodynamically significant stenosis. The left carotid artery is patent from the origin to the  skull base with no hemodynamically significant stenosis. No dissection or arterial injury. CERVICAL VERTEBRAL ARTERIES: The left vertebral artery is patent from the origin to the skull base and from its origin to the vertebrobasilar confluence. There is no evidence of dissection flap or significant irregularity of the left vertebral artery. The nondominant right vertebral artery is patent from the origin to the vertebrobasilar confluence. There is irregularity and multifocal stenosis of the V4 segment of the right vertebral artery. There is no dissection flap or significant irregularity of the extracranial right vertebral artery. LUNGS AND MEDIASTINUM: Unremarkable. SOFT TISSUES: No acute abnormality. BONES: No acute abnormality. CTA HEAD: ANTERIOR CIRCULATION: The intracranial internal carotid arteries are patent bilaterally. The anterior cerebral arteries are patent bilaterally with no significant stenosis. The middle cerebral arteries are patent bilaterally with no significant stenosis. No aneurysm. POSTERIOR CIRCULATION:  Fetal origin of the left PCA. No significant stenosis of the posterior cerebral arteries. No significant stenosis of the basilar artery. No significant stenosis of the intracranial vertebral arteries. No aneurysm. OTHER: No dural venous sinus thrombosis on this non-dedicated study. CT PERFUSION: EXAM QUALITY: Exam quality is adequate with diagnostic perfusion maps. No significant motion artifact. Appropriate arterial inflow and venous outflow curves. CORE INFARCT (CBF<30% volume): 0 mL TOTAL HYPOPERFUSION (Tmax>6s volume): 7 mL. Regenerative elevated Tmax in the anterior inferior left frontal lobe near the skull base which is favored to be artifactual. PENUMBRA: Mismatch volume: 7 mL. Mismatch ratio: Not applicable. Location: Anterior inferior left frontal lobe near the skull base. IMPRESSION: 1. No acute large vessel occlusion. 2. Diminished caliber of the V4 segment of the non-dominant right vertebral artery with irregularity and multifocal stenosis. Findings concerning for dissection. Recommend MRI brain and MRA head and neck for further evaluation. 3. No evidence of dissection in the extracranial vertebral arteries. 4. No evidence of ischemia on CT perfusion. Small region of elevated Tmax near the left anterior inferior frontal lobe favored to be artifact. 5. Findings discussed with Dr. Cleotilde at 5:44PM on 02/14/24. Electronically signed by: Donnice Mania MD 02/14/2024 05:45 PM EDT RP Workstation: [id number]   CT HEAD CODE STROKE WO CONTRAST (LKW 0-4.5h, LVO 0-24h) Result Date: 02/14/2024 EXAM: CT HEAD WITHOUT CONTRAST 02/14/2024 05:11:10 PM TECHNIQUE: CT of the head was performed without the administration of intravenous contrast. Automated exposure control, iterative reconstruction, and/or weight based adjustment of the mA/kV was utilized to reduce the radiation dose to as low as reasonably achievable. COMPARISON: CT head 02/20/2022. CLINICAL HISTORY: Neuro deficit, acute, stroke suspected. Pt BIB [facility]  from home. Pt had sudden onset of neck pain, room spinning dizziness, and nausea. Pt LKW was 1600. Brief neuro exam in triage shows no unilateral weakness, aphasia, dysarthria, or sensation changes. FINDINGS: BRAIN AND VENTRICLES: No acute hemorrhage. No evidence of acute infarct. No hydrocephalus. No extra-axial collection. No mass effect or midline shift. [place] stroke program early CT (aspect) score: Ganglionic (caudate, ic, lentiform nucleus, insula, M1-m3): 7 Supraganglionic (m4-m6): 3 Total: 10 ORBITS: No acute abnormality. SINUSES: Mucosal thickening throughout the paranasal sinuses, complete opacification of the left frontal sinus and left maxillary sinus, and near complete opacification of the ethmoid sinuses. Findings suggestive of chronic pansinusitis. SOFT TISSUES AND SKULL: No acute soft tissue abnormality. No skull fracture. IMPRESSION: 1. No acute intracranial abnormality. 2. ASPECTS 10. 3. Chronic pansinusitis as above. 4. Findings discussed with Dr. Cleotilde at 5:17PM on 02/14/24. Electronically signed by: Donnice Mania MD 02/14/2024 05:19 PM EDT RP Workstation: [id number]    EKG: I independently viewed  the EKG done and my findings are as followed: Normal sinus rhythm with sinus arrhythmia at a rate of 65 bpm  Assessment/Plan Present on Admission:  Tobacco abuse  Principal Problem:   Neck pain, acute Active Problems:   Tobacco abuse   Obesity, Class II, BMI 35-39.9   COPD (chronic obstructive pulmonary disease) (HCC)   Substance abuse (HCC)   Vertigo  Neck pain with vertigo, rule out acute ischemic stroke Patient will be admitted to telemetry unit  CT head without contrast showed no acute intracranial abnormality CT angiography of head and neck with perfusion showed no acute LVO, but suspicion for vertebral artery dissection, so, MRA of head and neck without contrast was done and showed no evidence of vertebral artery dissection MRI of brain without contrast showed no acute  intracranial abnormality Echocardiogram in the morning Continue fall precautions and neuro checks Lipid panel and hemoglobin A1c will be checked Continue PT/SLP/OT eval and treat Bedside swallow eval by nursing prior to diet Consider on call Neurology consult in the morning  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) Prediabetes Diet and lifestyle medication  COPD (not in acute exacerbation) Continue albuterol   Tobacco abuse Patient was counseled on tobacco abuse cessation  Substance abuse (THC) Patient was counseled on THC abuse cessation  DVT prophylaxis: SCDs  Code Status: Full code  Family Communication: None at bedside  Consults: None  Severity of Illness: The appropriate patient status for this patient is OBSERVATION. Observation status is judged to be reasonable and necessary in order to provide the required intensity of service to ensure the patient's safety. The patient's presenting symptoms, physical exam findings, and initial radiographic and laboratory data in the context of their medical condition is felt to place them at decreased risk for further clinical deterioration. Furthermore, it is anticipated that the patient will be medically stable for discharge from the hospital within 2 midnights of admission.   Author: Clarke Amburn, DO 02/14/2024 9:59 PM  For on call review [email or web address].

## 2024-02-14 NOTE — ED Notes (Signed)
 CT complete

## 2024-02-14 NOTE — ED Triage Notes (Signed)
 Pt BIB [facility] from home. Pt had sudden onset of neck pain, room spinning dizziness, and nausea. Pt LKW was 1600. Brief neuro exam in triage shows no unilateral weakness, aphasia, dysarthria, or sensation changes.    EMS Vitals  102/71 HR 75 SpO2 97% on R/A  CBG 137

## 2024-02-15 ENCOUNTER — Observation Stay (HOSPITAL_COMMUNITY)

## 2024-02-15 DIAGNOSIS — I10 Essential (primary) hypertension: Secondary | ICD-10-CM | POA: Diagnosis not present

## 2024-02-15 DIAGNOSIS — I2510 Atherosclerotic heart disease of native coronary artery without angina pectoris: Secondary | ICD-10-CM

## 2024-02-15 DIAGNOSIS — M542 Cervicalgia: Secondary | ICD-10-CM | POA: Diagnosis not present

## 2024-02-15 LAB — COMPREHENSIVE METABOLIC PANEL WITH GFR
ALT: 29 U/L (ref 0–44)
AST: 21 U/L (ref 15–41)
Albumin: 4.2 g/dL (ref 3.5–5.0)
Alkaline Phosphatase: 89 U/L (ref 38–126)
Anion gap: 12 (ref 5–15)
BUN: 12 mg/dL (ref 6–20)
CO2: 24 mmol/L (ref 22–32)
Calcium: 9.4 mg/dL (ref 8.9–10.3)
Chloride: 104 mmol/L (ref 98–111)
Creatinine, Ser: 0.76 mg/dL (ref 0.61–1.24)
GFR, Estimated: >60 mL/min (ref >60)
Glucose, Bld: 80 mg/dL (ref 70–99)
Potassium: 3.6 mmol/L (ref 3.5–5.1)
Sodium: 140 mmol/L (ref 135–145)
Total Bilirubin: 0.7 mg/dL (ref 0.0–1.2)
Total Protein: 7.7 g/dL (ref 6.5–8.1)

## 2024-02-15 LAB — ECHOCARDIOGRAM COMPLETE
AR max vel: 3.55 cm2
AV Area VTI: 4.53 cm2
AV Area mean vel: 3.71 cm2
AV Mean grad: 2.7 mmHg
AV Peak grad: 5.9 mmHg
Ao pk vel: 1.21 m/s
Area-P 1/2: 4.06 cm2
Calc EF: 62.8 %
Height: 71 in
S' Lateral: 4.1 cm
Single Plane A2C EF: 62.5 %
Single Plane A4C EF: 61.9 %
Weight: 4067.05 [oz_av]

## 2024-02-15 LAB — CBC
HCT: 48.6 % (ref 39.0–52.0)
Hemoglobin: 16.7 g/dL (ref 13.0–17.0)
MCH: 31 pg (ref 26.0–34.0)
MCHC: 34.4 g/dL (ref 30.0–36.0)
MCV: 90.2 fL (ref 80.0–100.0)
Platelets: 287 K/uL (ref 150–400)
RBC: 5.39 MIL/uL (ref 4.22–5.81)
RDW: 13.1 % (ref 11.5–15.5)
WBC: 13.5 K/uL — ABNORMAL HIGH (ref 4.0–10.5)
nRBC: 0 % (ref 0.0–0.2)

## 2024-02-15 LAB — URINE DRUG SCREEN
Amphetamines: NEGATIVE
Barbiturates: NEGATIVE
Benzodiazepines: NEGATIVE
Cocaine: NEGATIVE
Fentanyl: NEGATIVE
Methadone Scn, Ur: NEGATIVE
Opiates: NEGATIVE
Tetrahydrocannabinol: POSITIVE — AB

## 2024-02-15 LAB — LIPID PANEL
Cholesterol: 171 mg/dL (ref 0–200)
HDL: 19 mg/dL — ABNORMAL LOW (ref >40)
LDL Cholesterol: 96 mg/dL (ref 0–99)
Total CHOL/HDL Ratio: 9 ratio
Triglycerides: 278 mg/dL — ABNORMAL HIGH (ref <150)
VLDL: 56 mg/dL — ABNORMAL HIGH (ref 0–40)

## 2024-02-15 LAB — MAGNESIUM: Magnesium: 2 mg/dL (ref 1.7–2.4)

## 2024-02-15 LAB — PHOSPHORUS: Phosphorus: 3.3 mg/dL (ref 2.5–4.6)

## 2024-02-15 LAB — HIV ANTIBODY (ROUTINE TESTING W REFLEX): HIV Screen 4th Generation wRfx: NONREACTIVE

## 2024-02-15 LAB — HEMOGLOBIN A1C
Hgb A1c MFr Bld: 5.7 % — ABNORMAL HIGH (ref 4.8–5.6)
Mean Plasma Glucose: 116.89 mg/dL

## 2024-02-15 MED ORDER — ASPIRIN 81 MG PO TBEC: 81.0000 mg | DELAYED_RELEASE_TABLET | Freq: Every day | ORAL | 12 refills | Status: AC

## 2024-02-15 MED ORDER — METHOCARBAMOL 500 MG PO TABS: 500.0000 mg | ORAL_TABLET | Freq: Three times a day (TID) | ORAL | Status: DC | PRN

## 2024-02-15 MED ORDER — ROSUVASTATIN CALCIUM 20 MG PO TABS: 20.0000 mg | ORAL_TABLET | Freq: Every day | ORAL | 0 refills | Status: AC

## 2024-02-15 MED ORDER — CYCLOBENZAPRINE HCL 10 MG PO TABS: 10.0000 mg | ORAL_TABLET | Freq: Two times a day (BID) | ORAL | 0 refills | Status: AC | PRN

## 2024-02-15 MED ORDER — ASPIRIN 81 MG PO TBEC
81.0000 mg | DELAYED_RELEASE_TABLET | Freq: Every day | ORAL | Status: DC
  Administered 2024-02-15: 81 mg via ORAL
  Filled 2024-02-15: qty 1

## 2024-02-15 MED ORDER — CLOPIDOGREL BISULFATE 75 MG PO TABS: 75.0000 mg | ORAL_TABLET | Freq: Every day | ORAL | 0 refills | Status: DC

## 2024-02-15 MED ORDER — CLOPIDOGREL BISULFATE 75 MG PO TABS
75.0000 mg | ORAL_TABLET | Freq: Every day | ORAL | Status: DC
  Administered 2024-02-15: 75 mg via ORAL
  Filled 2024-02-15: qty 1

## 2024-02-15 MED ORDER — METHOCARBAMOL 1000 MG/10ML IJ SOLN
500.0000 mg | Freq: Once | INTRAMUSCULAR | Status: AC
  Administered 2024-02-15: 500 mg via INTRAVENOUS
  Filled 2024-02-15: qty 10

## 2024-02-15 MED ORDER — METHOCARBAMOL 500 MG PO TABS: 500.0000 mg | ORAL_TABLET | Freq: Three times a day (TID) | ORAL | 0 refills | Status: AC | PRN

## 2024-02-15 NOTE — Care Management Obs Status (Signed)
 MEDICARE OBSERVATION STATUS NOTIFICATION   Patient Details  Name: Chad Hampton MRN: [id number] Date of Birth: 25-Nov-1980   Medicare Observation Status Notification Given:  Yes    Hoy DELENA Bigness, LCSW 02/15/2024, 11:45 AM

## 2024-02-15 NOTE — Discharge Instructions (Signed)
 Rent/Utilities/Housing Agency: [employer] Address: [address] [address] [place], [place] [address]  [address] [place], [place] [address]  Phone Number: [phone] or [phone] For the hearing-impaired - Dial 711 for [employer] [place] [employer] Agency Name: Miguel Aschoff. Dept. of Health and Human Services Address: [address], [place], [place] [address] Phone: [phone] Website: www.co.[place].us Services Offered: Temporary financial assistance, subsidized housing, and utility  assistance   Agency Name: [employer]  Address: [address] [place], [place] [address] Phone: [phone] ext. [phone] Email: Contact: info@newrha .org Website: FootballPromos.co.nz Services Offered: Subsidized apartment rent based on income.   Agency Name: [employer] Ministry Address: [facility], [address] [place], [place] Phone: [phone]  Website: www.ccmeden.org Services Offered: Museum/gallery curator, utility assistance Technical brewer for all of  [facility], [employer], [employer], [employer]  April 04, 2020 [address] for [place] area only), rent assistance.   Agency Name: [facility] Address: [address] [place], [place], [place] [address] / [address]  [place] Phone: [phone] [place] / [phone] [place] Website: OpinionTrades.tn NetworkAffair.co.za Services Offered: Civil Service fast streamer, food, showers, hygiene products utility payment  assistance, thrift shops, rental assistance, Support Groups  Agency Name: [facility]  Address: [address] [place], [place] [address]  Phone: [phone] / [phone] Website: https://www.[facility].org/ Services Offered: Support groups for Bipolar, substance abuse, anger  management, panic depression and anxiety. Mobile crisis unit,  outpatient therapy, substance abuse treatment.  Agency Name: [employer]   Address: [address] [place], [place] [address]  Phone: [phone] Website: www.help[facility]-centeragainstviolence.org Services Offered: Support groups for domestic violence or sexual assault, support  group for elderly women and domestic assault.

## 2024-02-15 NOTE — Evaluation (Signed)
 Physical Therapy Evaluation Patient Details Name: Chad Hampton MRN: [id number] DOB: 08/29/1980 Today's Date: 02/15/2024  History of Present Illness  HPI: Chad Hampton is a 43 y.o. male with medical history significant of COPD, gout, tobacco abuse, prediabetes, substance (THC) abuse who presents to the emergency department due to sudden onset of sharp and stabbing pain in the midline of back of his neck with an associated vomiting, diaphoresis and vertigo that lasted about 45 minutes.  This occurred about 3 hours PTA, he endorsed clear vision, but positive nonpositional room spinning.     ED Course:   In the emergency department, BP was 153/93, other vital signs were within normal range.  Workup in the ED showed normal CBC except for WBC of 11.2 and hemoglobin of 17.6.  BMP was normal except for blood glucose of 119.  Alcohol was undetectable.  CT head without contrast showed no acute intracranial abnormality  CT angiography of head and neck with perfusion showed no acute LVO. Diminished caliber of the V4 segment of the non-dominant right vertebral artery with irregularity and multifocal stenosis. Findings concerning for dissection.  Neurologist (Dr. Merrianne) was consulted and recommended stroke workup and the patient can stay at [facility] per EDP.  Aspirin  and Plavix were given.  Neurointerventional (Dr. Lester) was consulted.  Dual antiplatelet therapy was recommended but there was no need for any intervention per EDP  Vascular surgeon (Dr. Gretta) consulted also states that no indication for any intervention per EDP.  Clinical Impression  PT able to rotate cervical spine in both directions without pain or dizziness.  Pt I in all mobility tasks.  No skilled PT needed at this time.         If plan is discharge home, recommend the following:     Can travel by private vehicle    yes    Equipment Recommendations  none  Recommendations for Other Services    none   Functional Status Assessment    Independent    Precautions / Restrictions Precautions Precautions: None Restrictions Weight Bearing Restrictions Per Provider Order: No      Mobility  Bed Mobility Overal bed mobility: Independent                  Transfers Overall transfer level: Independent Equipment used: None                    Ambulation/Gait Ambulation/Gait assistance: Independent Gait Distance (Feet): 300 Feet Assistive device: None Gait Pattern/deviations: WFL(Within Functional Limits)   Gait velocity interpretation: >2.62 ft/sec, indicative of community ambulatory           Pertinent Vitals/Pain Pain Assessment Pain Assessment: No/denies pain    Home Living Family/patient expects to be discharged to:: Private residence Living Arrangements: Other relatives Available Help at Discharge: Friend(s) Type of Home: House Home Access: Stairs to enter       Home Layout: One level        Prior Function  Independent                      Extremity/Trunk Assessment        Lower Extremity Assessment Lower Extremity Assessment: Overall WFL for tasks assessed       Communication   Communication Communication: No apparent difficulties    Cognition Arousal: Alert Behavior During Therapy: WFL for tasks assessed/performed   PT - Cognitive impairments: No apparent impairments  Following commands: Intact       Cueing Cueing Techniques: Verbal cues     General Comments      Exercises     Assessment/Plan    PT Assessment Patient does not need any further PT services  PT Problem List         PT Treatment Interventions      PT Goals (Current goals can be found in the Care Plan section)  Acute Rehab PT Goals Patient Stated Goal: to go home PT Goal Formulation: With patient Time For Goal Achievement: 02/15/24 Potential to Achieve Goals: Good               End of Session Equipment Utilized During Treatment:  Gait belt Activity Tolerance: Patient tolerated treatment well Patient left: in bed   PT Visit Diagnosis: Dizziness and giddiness (R42)    Time: 8974-8965 PT Time Calculation (min) (ACUTE ONLY): 9 min   Charges:   PT Evaluation $PT Eval Low Complexity: 1 Low   PT General Charges $$ ACUTE PT VISIT: 1 Visit       Montie Metro, PT CLT [phone]  02/15/2024, 10:35 AM

## 2024-02-15 NOTE — Progress Notes (Signed)
  Echocardiogram 2D Echocardiogram has been performed.  Devora Ellouise SAUNDERS 02/15/2024, 11:47 AM

## 2024-02-15 NOTE — Progress Notes (Signed)
  Transition of Care [facility]) Screening Note   Patient Details  Name: SHAKIL DIRK Date of Birth: 05/17/1980   Transition of Care [facility]) CM/SW Contact:    Hoy DELENA Bigness, LCSW Phone Number: 02/15/2024, 11:55 AM    Transition of Care Department [facility]) has reviewed patient and no TOC needs have been identified at this time. We will continue to monitor patient advancement through interdisciplinary progression rounds. If new patient transition needs arise, please place a TOC consult.    02/15/24 1155  TOC Brief Assessment  Insurance and Status Reviewed  Patient has primary care physician Yes  Home environment has been reviewed Single family home  Prior level of function: Independent  Prior/Current Home Services No current home services  Social Drivers of Health Review SDOH reviewed interventions complete  Readmission risk has been reviewed Yes  Transition of care needs no transition of care needs at this time

## 2024-02-15 NOTE — Discharge Summary (Signed)
 Physician Discharge Summary  Chad Hampton FMW:[id number] DOB: 1980-11-25 DOA: 02/14/2024  PCP: Patient, No Pcp Per  Admit date: 02/14/2024 Discharge date: 02/15/2024  Admitted From: Home Disposition: Home  Recommendations for Outpatient Follow-up:  Follow up with PCP in 1 week , f/u neurology Follow up in ED if symptoms worsen or new appear   Home Health: No Equipment/Devices: None  Discharge Condition: Stable CODE STATUS: Full Diet recommendation: Heart healthy  Brief/Interim Summary:  Brief Narrative:     Chad Hampton is a 43 y.o. male with medical history significant of COPD, gout, tobacco abuse, prediabetes, substance (THC) abuse who presents to the emergency department due to sudden onset of sharp and stabbing pain in the midline of back of his neck with an associated vomiting, diaphoresis and vertigo that lasted about 45 minutes.  This occurred about 3 hours PTA, he endorsed clear vision, but positive nonpositional room spinning.   ED Course:  In the emergency department, BP was 153/93, other vital signs were within normal range.  Workup in the ED showed normal CBC except for WBC of 11.2 and hemoglobin of 17.6.  BMP was normal except for blood glucose of 119.  Alcohol was undetectable. CT head without contrast showed no acute intracranial abnormality CT angiography of head and neck with perfusion showed no acute LVO. Diminished caliber of the V4 segment of the non-dominant right vertebral artery with irregularity and multifocal stenosis. Findings concerning for dissection. Neurologist (Dr. Merrianne), Neurointerventional (Dr. Lester) ,Vascular surgeon (Dr. Gretta) consulted by ER.  Patient initiated on dual antiplatelet therapy with aspirin  and Plavix.   Assessment/Plan  Neck pain with vertigo  - Acute ischemic stroke ruled out CT angiography of head and neck with perfusion showed no acute LVO, but suspicion for vertebral artery dissection, so, MRA of neck without contrast was  done and showed no evidence of vertebral artery dissection in the neck. MRI of brain without contrast showed no acute intracranial abnormality Echocardiogram shows no significant abnormalities, no PFO or thrombus   Discussed with with Dr. Lindzen today.  MRA of neck does not show vertebral artery dissection but MRA head was not performed.  Vertebral artery dissection is questionable however he recommends dual antiplatelet therapy and outpatient follow-up.  Discussed with patient and he is agreeable with the plan.  Will discharge him once workup completed.   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) Prediabetes Diet and lifestyle medication   COPD (not in acute exacerbation) Continue albuterol    Tobacco abuse Patient was counseled on tobacco abuse cessation   Substance abuse (THC) Patient was counseled on THC abuse cessation Discharge Diagnoses:  Principal Problem:   Neck pain, acute Active Problems:   Tobacco abuse   Obesity, Class II, BMI 35-39.9   COPD (chronic obstructive pulmonary disease) (HCC)   Substance abuse (HCC)   Vertigo    Discharge Instructions  Discharge Instructions     Ambulatory referral to Neurology   Complete by: As directed    An appointment is requested in approximately: 1-2 weeks  Diagnosis: Vertebral artery dissection   Diet - low sodium heart healthy   Complete by: As directed    Discharge instructions   Complete by: As directed    1.  Start taking aspirin , Plavix, statin due to concern for vertebral artery dissection.  Follow-up with neurology closely as an outpatient 2.  You can take muscle relaxer for symptoms relief. 3.  Follow-up with your primary care provider in 1 week 4.  Immediately return to the  ER if recurrence of symptoms   Increase activity slowly   Complete by: As directed       Allergies as of 02/15/2024       Reactions   Morphine Shortness Of Breath   Penicillins Anaphylaxis, Other (See Comments)   Pt states high severity reaction  but unknown since childhood   Sulfa Antibiotics Shortness Of Breath, Other (See Comments)   Bothers my breathing   Tramadol Other (See Comments)   Seizures   Ibuprofen Rash, Dermatitis        Medication List     STOP taking these medications    doxycycline  100 MG capsule Commonly known as: VIBRAMYCIN    predniSONE  10 MG (21) Tbpk tablet Commonly known as: STERAPRED UNI-PAK 21 TAB       TAKE these medications    albuterol  (2.5 MG/3ML) 0.083% nebulizer solution Commonly known as: PROVENTIL  Take 3 mLs (2.5 mg total) by nebulization every 6 (six) hours as needed for wheezing or shortness of breath.   aspirin  EC 81 MG tablet Take 1 tablet (81 mg total) by mouth daily. Swallow whole. Start taking on: February 16, 2024   clopidogrel 75 MG tablet Commonly known as: PLAVIX Take 1 tablet (75 mg total) by mouth daily.   colchicine  0.6 MG tablet Take 1 tablet (0.6 mg total) by mouth daily.   cyclobenzaprine  10 MG tablet Commonly known as: FLEXERIL  Take 1 tablet (10 mg total) by mouth 2 (two) times daily as needed for muscle spasms.   HYDROcodone -acetaminophen  5-325 MG tablet Commonly known as: NORCO/VICODIN Take 1-2 tablets by mouth every 6 (six) hours as needed for severe pain.   lidocaine  5 % Commonly known as: Lidoderm  Place 1 patch onto the skin daily. Remove & Discard patch within 12 hours or as directed by MD   methocarbamol 500 MG tablet Commonly known as: ROBAXIN Take 1 tablet (500 mg total) by mouth every 8 (eight) hours as needed for muscle spasms.   rosuvastatin 20 MG tablet Commonly known as: Crestor Take 1 tablet (20 mg total) by mouth daily.        Allergies  Allergen Reactions   Morphine Shortness Of Breath   Penicillins Anaphylaxis and Other (See Comments)    Pt states high severity reaction but unknown since childhood   Sulfa Antibiotics Shortness Of Breath and Other (See Comments)    Bothers my breathing   Tramadol Other (See Comments)     Seizures    Ibuprofen Rash and Dermatitis    Consultations:    Procedures/Studies: ECHOCARDIOGRAM COMPLETE Result Date: 02/15/2024    ECHOCARDIOGRAM REPORT   Patient Name:   Chad Hampton Date of Exam: 02/15/2024 Medical Rec #:  [id number]     Height:       71.0 in Accession #:    [id number]    Weight:       254.2 lb Date of Birth:  16-Aug-1980     BSA:          2.335 m Patient Age:    43 years      BP:           107/72 mmHg Patient Gender: M             HR:           48 bpm. Exam Location:  [facility] Procedure: 2D Echo, Cardiac Doppler and Color Doppler (Both Spectral and Color            Flow Doppler were utilized  during procedure). Indications:    Stroke  History:        Patient has prior history of Echocardiogram examinations, most                 recent 01/28/2017. CAD, COPD; Risk Factors:Current Smoker,                 Diabetes, Hypertension and Dyslipidemia. Substance abuse.  Sonographer:    Ellouise Mose RDCS Referring Phys: [id number] OLADAPO ADEFESO  Sonographer Comments: Technically difficult study due to poor echo windows. Image acquisition challenging due to patient body habitus. IMPRESSIONS  1. Left ventricular ejection fraction, by estimation, is 60 to 65%. Left ventricular ejection fraction by 2D MOD biplane is 62.8 %. Left ventricular ejection fraction by PLAX is 59 %. The left ventricle has normal function. The left ventricle has no regional wall motion abnormalities. Left ventricular diastolic parameters are indeterminate.  2. Right ventricular systolic function is low normal. The right ventricular size is mildly enlarged. Tricuspid regurgitation signal is inadequate for assessing PA pressure.  3. The mitral valve is normal in structure. Trivial mitral valve regurgitation. No evidence of mitral stenosis.  4. The aortic valve was not well visualized. Aortic valve regurgitation is mild to moderate. No aortic stenosis is present.  5. There is mild dilatation of the ascending aorta, measuring  39 mm.  6. The inferior vena cava is normal in size with greater than 50% respiratory variability, suggesting right atrial pressure of 3 mmHg. FINDINGS  Left Ventricle: Left ventricular ejection fraction, by estimation, is 60 to 65%. Left ventricular ejection fraction by PLAX is 59 %. Left ventricular ejection fraction by 2D MOD biplane is 62.8 %. The left ventricle has normal function. The left ventricle has no regional wall motion abnormalities. The left ventricular internal cavity size was normal in size. There is no left ventricular hypertrophy. Left ventricular diastolic parameters are indeterminate. Right Ventricle: The right ventricular size is mildly enlarged. Right vetricular wall thickness was not well visualized. Right ventricular systolic function is low normal. Tricuspid regurgitation signal is inadequate for assessing PA pressure. Left Atrium: Left atrial size was normal in size. Right Atrium: Right atrial size was normal in size. Pericardium: There is no evidence of pericardial effusion. Mitral Valve: The mitral valve is normal in structure. Trivial mitral valve regurgitation. No evidence of mitral valve stenosis. Tricuspid Valve: The tricuspid valve is normal in structure. Tricuspid valve regurgitation is trivial. No evidence of tricuspid stenosis. Aortic Valve: The aortic valve was not well visualized. Aortic valve regurgitation is mild to moderate. No aortic stenosis is present. Aortic valve mean gradient measures 2.7 mmHg. Aortic valve peak gradient measures 5.9 mmHg. Aortic valve area, by VTI measures 4.53 cm. Pulmonic Valve: The pulmonic valve was not well visualized. Pulmonic valve regurgitation is not visualized. No evidence of pulmonic stenosis. Aorta: The aortic root is normal in size and structure. There is mild dilatation of the ascending aorta, measuring 39 mm. Venous: The inferior vena cava is normal in size with greater than 50% respiratory variability, suggesting right atrial pressure  of 3 mmHg. IAS/Shunts: The interatrial septum appears to be lipomatous. No atrial level shunt detected by color flow Doppler.  LEFT VENTRICLE PLAX 2D                        Biplane EF (MOD) LV EF:         Left  LV Biplane EF:   Left                ventricular                      ventricular                ejection                         ejection                fraction by                      fraction by                PLAX is 59                       2D MOD                %.                               biplane is LVIDd:         6.00 cm                          62.8 %. LVIDs:         4.10 cm LV PW:         1.00 cm         Diastology LV IVS:        0.90 cm         LV e' medial:    8.59 cm/s LVOT diam:     2.40 cm         LV E/e' medial:  10.0 LV SV:         111             LV e' lateral:   14.50 cm/s LV SV Index:   48              LV E/e' lateral: 5.9 LVOT Area:     4.52 cm  LV Volumes (MOD) LV vol d, MOD    105.0 ml A2C: LV vol d, MOD    126.0 ml A4C: LV vol s, MOD    39.4 ml A2C: LV vol s, MOD    48.0 ml A4C: LV SV MOD A2C:   65.6 ml LV SV MOD A4C:   126.0 ml LV SV MOD BP:    77.0 ml IVC IVC diam: 2.40 cm LEFT ATRIUM             Index        RIGHT ATRIUM           Index LA diam:        4.00 cm 1.71 cm/m   RA Area:     14.50 cm LA Vol (A2C):   38.1 ml 16.32 ml/m  RA Volume:   34.10 ml  14.61 ml/m LA Vol (A4C):   36.0 ml 15.42 ml/m LA Biplane Vol: 38.2 ml 16.36 ml/m  AORTIC VALVE AV Area (Vmax):    3.55 cm AV Area (Vmean):   3.71 cm AV Area (VTI):     4.53 cm AV Vmax:  121.15 cm/s AV Vmean:          75.784 cm/s AV VTI:            0.245 m AV Peak Grad:      5.9 mmHg AV Mean Grad:      2.7 mmHg LVOT Vmax:         95.00 cm/s LVOT Vmean:        62.200 cm/s LVOT VTI:          0.246 m LVOT/AV VTI ratio: 1.00  AORTA Ao Root diam: 3.10 cm Ao Asc diam:  3.90 cm MITRAL VALVE MV Area (PHT): 4.06 cm    SHUNTS MV Decel Time: 187 msec    Systemic VTI:  0.25 m MV E velocity: 86.20 cm/s  Systemic  Diam: 2.40 cm MV A velocity: 70.40 cm/s MV E/A ratio:  1.22 Dorn Ross MD Electronically signed by Dorn Ross MD Signature Date/Time: 02/15/2024/12:37:41 PM    Final    MR Angiogram Neck W or Wo Contrast Result Date: 02/14/2024 EXAM: MRA Neck Without and with contrast 02/14/2024 09:12:32 PM TECHNIQUE: Multiplanar multisequence MRA of the neck was performed without and with the administration of 10 mL gadobutrol (GADAVIST) 1 MMOL/ML injection. 2D and 3D reformatted images are provided for review. Stenosis of the internal carotid arteries is measured using NASCET criteria. The examination is degraded by motion artifact. COMPARISON: Comparison made with prior study from earlier the same day. CLINICAL HISTORY: Vertigo, central; r/o vert arter disection. FINDINGS: CAROTID ARTERIES: No dissection. No hemodynamically significant stenosis by NASCET criteria. VERTEBRAL ARTERIES: Both vertebral arteries arising from the subclavian arteries. Left vertebral artery strongly dominant, with a diffusely hypoplastic right vertebral artery. Vertebral arteries are patent with antegrade flow. No evidence for dissection or stenosis by MRA. IMPRESSION: 1. Negative MRA of the neck. No evidence of vertebral artery dissection by MRA. Electronically signed by: Morene Hoard MD 02/14/2024 09:46 PM EDT RP Workstation: [id number]   MR Brain W and Wo Contrast Result Date: 02/14/2024 EXAM: MRI BRAIN WITH AND WITHOUT CONTRAST 02/14/2024 09:13:13 PM TECHNIQUE: Multiplanar multisequence MRI of the head/brain was performed with and without the administration of 10 mL gadobutrol (GADAVIST) 1 MMOL/ML injection. COMPARISON: Prior CT from 02/14/2024. CLINICAL HISTORY: Syncope/presyncope, cerebrovascular cause suspected; posterior circulation. FINDINGS: BRAIN AND VENTRICLES: No acute infarct. No acute intracranial hemorrhage. No mass effect or midline shift. No hydrocephalus. The sella is unremarkable. Normal flow voids. No mass or  abnormal enhancement. ORBITS: No acute abnormality. SINUSES: Extensive chronic pansinusitis noted, overall worse on the left. BONES AND SOFT TISSUES: Normal bone marrow signal and enhancement. No acute soft tissue abnormality. IMPRESSION: 1. Normal brain MRI. No acute intracranial abnormality. 2. Extensive chronic pansinusitis, worse on the left. Electronically signed by: Morene Hoard MD 02/14/2024 09:33 PM EDT RP Workstation: [id number]   CT ANGIO HEAD NECK W WO CM W PERF (CODE STROKE) LKW > 6h Result Date: 02/14/2024 EXAM: CTA Head and Neck with Perfusion 02/14/2024 05:12:35 PM TECHNIQUE: CTA of the head and neck was performed without and with the administration of 100 mL of iohexol (OMNIPAQUE) 350 MG/ML injection. 3D postprocessing with multiplanar reconstructions and MIPs was performed to evaluate the vascular anatomy. Cerebral perfusion analysis using computed tomography with contrast administration, including post-processing of parametric maps with determination of cerebral blood flow, cerebral blood volume, mean transit time and time-to-maximum. Automated exposure control, iterative reconstruction, and/or weight based adjustment of the mA/kV was utilized to reduce the radiation dose to as low as reasonably achievable. COMPARISON:  Same day CT head. CLINICAL HISTORY: Neuro deficit, acute, stroke suspected; posterior neck pain, dizziness, vomiting. Pt BIB [facility] from home. Pt had sudden onset of neck pain, room spinning dizziness, and nausea. Pt LKW was 1600. Brief neuro exam in triage shows no unilateral weakness, aphasia, dysarthria, or sensation changes. FINDINGS: CTA NECK: AORTIC ARCH AND ARCH VESSELS: No dissection or arterial injury. No significant stenosis of the brachiocephalic or subclavian arteries. CERVICAL CAROTID ARTERIES: The right carotid artery is patent from the origin to the skull base with no hemodynamically significant stenosis. The left carotid artery is patent from the origin to  the skull base with no hemodynamically significant stenosis. No dissection or arterial injury. CERVICAL VERTEBRAL ARTERIES: The left vertebral artery is patent from the origin to the skull base and from its origin to the vertebrobasilar confluence. There is no evidence of dissection flap or significant irregularity of the left vertebral artery. The nondominant right vertebral artery is patent from the origin to the vertebrobasilar confluence. There is irregularity and multifocal stenosis of the V4 segment of the right vertebral artery. There is no dissection flap or significant irregularity of the extracranial right vertebral artery. LUNGS AND MEDIASTINUM: Unremarkable. SOFT TISSUES: No acute abnormality. BONES: No acute abnormality. CTA HEAD: ANTERIOR CIRCULATION: The intracranial internal carotid arteries are patent bilaterally. The anterior cerebral arteries are patent bilaterally with no significant stenosis. The middle cerebral arteries are patent bilaterally with no significant stenosis. No aneurysm. POSTERIOR CIRCULATION: Fetal origin of the left PCA. No significant stenosis of the posterior cerebral arteries. No significant stenosis of the basilar artery. No significant stenosis of the intracranial vertebral arteries. No aneurysm. OTHER: No dural venous sinus thrombosis on this non-dedicated study. CT PERFUSION: EXAM QUALITY: Exam quality is adequate with diagnostic perfusion maps. No significant motion artifact. Appropriate arterial inflow and venous outflow curves. CORE INFARCT (CBF<30% volume): 0 mL TOTAL HYPOPERFUSION (Tmax>6s volume): 7 mL. Regenerative elevated Tmax in the anterior inferior left frontal lobe near the skull base which is favored to be artifactual. PENUMBRA: Mismatch volume: 7 mL. Mismatch ratio: Not applicable. Location: Anterior inferior left frontal lobe near the skull base. IMPRESSION: 1. No acute large vessel occlusion. 2. Diminished caliber of the V4 segment of the non-dominant  right vertebral artery with irregularity and multifocal stenosis. Findings concerning for dissection. Recommend MRI brain and MRA head and neck for further evaluation. 3. No evidence of dissection in the extracranial vertebral arteries. 4. No evidence of ischemia on CT perfusion. Small region of elevated Tmax near the left anterior inferior frontal lobe favored to be artifact. 5. Findings discussed with Dr. Cleotilde at 5:44PM on 02/14/24. Electronically signed by: Donnice Mania MD 02/14/2024 05:45 PM EDT RP Workstation: [id number]   CT HEAD CODE STROKE WO CONTRAST (LKW 0-4.5h, LVO 0-24h) Result Date: 02/14/2024 EXAM: CT HEAD WITHOUT CONTRAST 02/14/2024 05:11:10 PM TECHNIQUE: CT of the head was performed without the administration of intravenous contrast. Automated exposure control, iterative reconstruction, and/or weight based adjustment of the mA/kV was utilized to reduce the radiation dose to as low as reasonably achievable. COMPARISON: CT head 02/20/2022. CLINICAL HISTORY: Neuro deficit, acute, stroke suspected. Pt BIB [facility] from home. Pt had sudden onset of neck pain, room spinning dizziness, and nausea. Pt LKW was 1600. Brief neuro exam in triage shows no unilateral weakness, aphasia, dysarthria, or sensation changes. FINDINGS: BRAIN AND VENTRICLES: No acute hemorrhage. No evidence of acute infarct. No hydrocephalus. No extra-axial collection. No mass effect or midline shift. [place] stroke program early CT (aspect)  score: Ganglionic (caudate, ic, lentiform nucleus, insula, M1-m3): 7 Supraganglionic (m4-m6): 3 Total: 10 ORBITS: No acute abnormality. SINUSES: Mucosal thickening throughout the paranasal sinuses, complete opacification of the left frontal sinus and left maxillary sinus, and near complete opacification of the ethmoid sinuses. Findings suggestive of chronic pansinusitis. SOFT TISSUES AND SKULL: No acute soft tissue abnormality. No skull fracture. IMPRESSION: 1. No acute intracranial abnormality.  2. ASPECTS 10. 3. Chronic pansinusitis as above. 4. Findings discussed with Dr. Cleotilde at 5:17PM on 02/14/24. Electronically signed by: Donnice Mania MD 02/14/2024 05:19 PM EDT RP Workstation: [id number]      Subjective:   Discharge Exam: Vitals:   02/15/24 0946 02/15/24 1325  BP: 107/72 105/70  Pulse: (!) 53 (!) 55  Resp: 17 17  Temp: 97.6 F (36.4 C) (!) 97.5 F (36.4 C)  SpO2: 98% 97%    General: Pt is alert, awake, not in acute distress Cardiovascular: rate controlled, S1/S2 + Respiratory: bilateral decreased breath sounds at bases Abdominal: Soft, NT, ND, bowel sounds + Extremities: no edema, no cyanosis    The results of significant diagnostics from this hospitalization (including imaging, microbiology, ancillary and laboratory) are listed below for reference.     Microbiology: No results found for this or any previous visit (from the past 240 hours).   Labs: BNP (last 3 results) No results for input(s): BNP in the last 8760 hours. Basic Metabolic Panel: Recent Labs  Lab 02/14/24 1658 02/14/24 1705 02/15/24 0307  NA 141 142 140  K 4.3 4.2 3.6  CL 104 105 104  CO2 24  --  24  GLUCOSE 119* 122* 80  BUN 11 11 12   CREATININE 0.92 1.00 0.76  CALCIUM  9.6  --  9.4  MG  --   --  2.0  PHOS  --   --  3.3   Liver Function Tests: Recent Labs  Lab 02/14/24 1658 02/15/24 0307  AST 25 21  ALT 31 29  ALKPHOS 105 89  BILITOT 0.5 0.7  PROT 8.3* 7.7  ALBUMIN 4.5 4.2   No results for input(s): LIPASE, AMYLASE in the last 168 hours. No results for input(s): AMMONIA in the last 168 hours. CBC: Recent Labs  Lab 02/14/24 1658 02/14/24 1705 02/15/24 0307  WBC 11.2*  --  13.5*  NEUTROABS 5.5  --   --   HGB 17.6* 16.7 16.7  HCT 51.8 49.0 48.6  MCV 90.6  --  90.2  PLT 287  --  287   Cardiac Enzymes: No results for input(s): CKTOTAL, CKMB, CKMBINDEX, TROPONINI in the last 168 hours. BNP: Invalid input(s): POCBNP CBG: No results for  input(s): GLUCAP in the last 168 hours. D-Dimer No results for input(s): DDIMER in the last 72 hours. Hgb A1c Recent Labs    02/15/24 0307  HGBA1C 5.7*   Lipid Profile Recent Labs    02/15/24 0307  CHOL 171  HDL 19*  LDLCALC 96  TRIG 721*  CHOLHDL 9.0   Thyroid  function studies No results for input(s): TSH, T4TOTAL, T3FREE, THYROIDAB in the last 72 hours.  Invalid input(s): FREET3 Anemia work up No results for input(s): VITAMINB12, FOLATE, FERRITIN, TIBC, IRON, RETICCTPCT in the last 72 hours. Urinalysis    Component Value Date/Time   COLORURINE YELLOW 02/21/2022 0057   APPEARANCEUR CLOUDY (A) 02/21/2022 0057   LABSPEC 1.024 02/21/2022 0057   PHURINE 7.0 02/21/2022 0057   GLUCOSEU NEGATIVE 02/21/2022 0057   HGBUR NEGATIVE 02/21/2022 0057   BILIRUBINUR NEGATIVE 02/21/2022 0057   THEADORE  NEGATIVE 02/21/2022 0057   PROTEINUR 30 (A) 02/21/2022 0057   UROBILINOGEN 0.2 11/13/2011 0101   NITRITE NEGATIVE 02/21/2022 0057   LEUKOCYTESUR NEGATIVE 02/21/2022 0057   Sepsis Labs Recent Labs  Lab 02/14/24 1658 02/15/24 0307  WBC 11.2* 13.5*   Microbiology No results found for this or any previous visit (from the past 240 hours).   Time coordinating discharge: 35 minutes  SIGNED:   Delva Derden, MD  [facility] 02/15/2024, 3:50 PM

## 2024-02-15 NOTE — Progress Notes (Signed)
 SLP Cancellation Note  Patient Details Name: Chad Hampton MRN: [id number] DOB: November 22, 1980   Cancelled treatment:       Reason Eval/Treat Not Completed: SLP screened, no needs identified, will sign off  Thank you,  Lamar Candy, CCC-SLP [phone]  Vijay Durflinger 02/15/2024, 4:15 PM

## 2024-02-15 NOTE — Progress Notes (Signed)
 PROGRESS NOTE    Chad Hampton  FMW:[id number] DOB: 07/31/1980 DOA: 02/14/2024 PCP: Patient, No Pcp Per   Brief Narrative:    Chad Hampton is a 43 y.o. male with medical history significant of COPD, gout, tobacco abuse, prediabetes, substance (THC) abuse who presents to the emergency department due to sudden onset of sharp and stabbing pain in the midline of back of his neck with an associated vomiting, diaphoresis and vertigo that lasted about 45 minutes.  This occurred about 3 hours PTA, he endorsed clear vision, but positive nonpositional room spinning.   ED Course:  In the emergency department, BP was 153/93, other vital signs were within normal range.  Workup in the ED showed normal CBC except for WBC of 11.2 and hemoglobin of 17.6.  BMP was normal except for blood glucose of 119.  Alcohol was undetectable. CT head without contrast showed no acute intracranial abnormality CT angiography of head and neck with perfusion showed no acute LVO. Diminished caliber of the V4 segment of the non-dominant right vertebral artery with irregularity and multifocal stenosis. Findings concerning for dissection. Neurologist (Dr. Merrianne), Neurointerventional (Dr. Lester) ,Vascular surgeon (Dr. Gretta) consulted by ER.  Patient initiated on dual antiplatelet therapy with aspirin  and Plavix.  Assessment & Plan:   Principal Problem:   Neck pain, acute Active Problems:   Tobacco abuse   Obesity, Class II, BMI 35-39.9   COPD (chronic obstructive pulmonary disease) (HCC)   Substance abuse (HCC)   Vertigo  Neck pain with vertigo, rule out acute ischemic stroke Patient will be admitted to telemetry unit  CT head without contrast showed no acute intracranial abnormality CT angiography of head and neck with perfusion showed no acute LVO, but suspicion for vertebral artery dissection, so, MRA of neck without contrast was done and showed no evidence of vertebral artery dissection in the neck. MRI of brain  without contrast showed no acute intracranial abnormality Echocardiogram is completed today, report pending. Continue fall precautions and neuro checks Lipid panel and hemoglobin A1c will be checked Continue PT/SLP/OT eval and treat  Discussed with with Dr. Lindzen today.  MRA of neck does not show vertebral artery dissection but MRA head was not performed.  Vertebral artery dissection is questionable however he recommends dual antiplatelet therapy and outpatient follow-up.  Discussed with patient and he is agreeable with the plan.  Will discharge him once workup completed.  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) Prediabetes Diet and lifestyle medication   COPD (not in acute exacerbation) Continue albuterol    Tobacco abuse Patient was counseled on tobacco abuse cessation   Substance abuse (THC) Patient was counseled on THC abuse cessation   DVT prophylaxis: SCDs   Code Status: Full code   Family Communication: None at bedside   Consults: None   Severity of Illness: The appropriate patient status for this patient is OBSERVATION. Observation status is judged to be reasonable and necessary in order to provide the required intensity of service to ensure the patient's safety. The patient's presenting symptoms, physical exam findings, and initial radiographic and laboratory data in the context of their medical condition is felt to place them at decreased risk for further clinical deterioration. Furthermore, it is anticipated that the patient will be medically stable for discharge from the hospital within 2 midnights of admission.     Subjective: Patient seen and examined at the bedside.  He reports of improvement in neck pain.  Denies any neurological symptoms.  Vital signs are stable.  Objective: Vitals:  02/15/24 0136 02/15/24 0516 02/15/24 0946 02/15/24 1325  BP: (!) 135/91 (!) 158/76 107/72 105/70  Pulse: 62 (!) 54 (!) 53 (!) 55  Resp: 16 16 17 17   Temp: 98 F (36.7 C) 97.7 F (36.5  C) 97.6 F (36.4 C) (!) 97.5 F (36.4 C)  TempSrc: Oral Oral Oral Axillary  SpO2: 97% 97% 98% 97%  Weight:      Height:        Intake/Output Summary (Last 24 hours) at 02/15/2024 1524 Last data filed at 02/15/2024 1300 Gross per 24 hour  Intake 944.66 ml  Output --  Net 944.66 ml   Filed Weights   02/14/24 1641 02/14/24 2145  Weight: 124.7 kg 115.3 kg    Examination:  General exam: Appears calm and comfortable  Respiratory system: Bilateral decreased breath sounds at bases Cardiovascular system: S1 & S2 heard, Rate controlled Gastrointestinal system: Abdomen is nondistended, soft and nontender. Normal bowel sounds heard. Extremities: No cyanosis, clubbing, edema  Central nervous system: Alert and oriented. No focal neurological deficits. Moving extremities Skin: No rashes, lesions or ulcers Psychiatry: Judgement and insight appear normal. Mood & affect appropriate.     Data Reviewed: I have personally reviewed following labs and imaging studies  CBC: Recent Labs  Lab 02/14/24 1658 02/14/24 1705 02/15/24 0307  WBC 11.2*  --  13.5*  NEUTROABS 5.5  --   --   HGB 17.6* 16.7 16.7  HCT 51.8 49.0 48.6  MCV 90.6  --  90.2  PLT 287  --  287   Basic Metabolic Panel: Recent Labs  Lab 02/14/24 1658 02/14/24 1705 02/15/24 0307  NA 141 142 140  K 4.3 4.2 3.6  CL 104 105 104  CO2 24  --  24  GLUCOSE 119* 122* 80  BUN 11 11 12   CREATININE 0.92 1.00 0.76  CALCIUM  9.6  --  9.4  MG  --   --  2.0  PHOS  --   --  3.3   GFR: Estimated Creatinine Clearance: 150.6 mL/min (by C-G formula based on SCr of 0.76 mg/dL). Liver Function Tests: Recent Labs  Lab 02/14/24 1658 02/15/24 0307  AST 25 21  ALT 31 29  ALKPHOS 105 89  BILITOT 0.5 0.7  PROT 8.3* 7.7  ALBUMIN 4.5 4.2   No results for input(s): LIPASE, AMYLASE in the last 168 hours. No results for input(s): AMMONIA in the last 168 hours. Coagulation Profile: Recent Labs  Lab 02/14/24 1658  INR 0.9    Cardiac Enzymes: No results for input(s): CKTOTAL, CKMB, CKMBINDEX, TROPONINI in the last 168 hours. BNP (last 3 results) No results for input(s): PROBNP in the last 8760 hours. HbA1C: Recent Labs    02/15/24 0307  HGBA1C 5.7*   CBG: No results for input(s): GLUCAP in the last 168 hours. Lipid Profile: Recent Labs    02/15/24 0307  CHOL 171  HDL 19*  LDLCALC 96  TRIG 721*  CHOLHDL 9.0   Thyroid  Function Tests: No results for input(s): TSH, T4TOTAL, FREET4, T3FREE, THYROIDAB in the last 72 hours. Anemia Panel: No results for input(s): VITAMINB12, FOLATE, FERRITIN, TIBC, IRON, RETICCTPCT in the last 72 hours. Sepsis Labs: No results for input(s): PROCALCITON, LATICACIDVEN in the last 168 hours.  No results found for this or any previous visit (from the past 240 hours).       Radiology Studies: ECHOCARDIOGRAM COMPLETE Result Date: 02/15/2024    ECHOCARDIOGRAM REPORT   Patient Name:   DEMITRIS POKORNY Date of  Exam: 02/15/2024 Medical Rec #:  [id number]     Height:       71.0 in Accession #:    [id number]    Weight:       254.2 lb Date of Birth:  [birth year]     BSA:          2.335 m Patient Age:    43 years      BP:           107/72 mmHg Patient Gender: M             HR:           48 bpm. Exam Location:  [facility] Procedure: 2D Echo, Cardiac Doppler and Color Doppler (Both Spectral and Color            Flow Doppler were utilized during procedure). Indications:    Stroke  History:        Patient has prior history of Echocardiogram examinations, most                 recent 01/28/2017. CAD, COPD; Risk Factors:Current Smoker,                 Diabetes, Hypertension and Dyslipidemia. Substance abuse.  Sonographer:    Ellouise Mose RDCS Referring Phys: [id number] OLADAPO ADEFESO  Sonographer Comments: Technically difficult study due to poor echo windows. Image acquisition challenging due to patient body habitus. IMPRESSIONS  1. Left ventricular ejection  fraction, by estimation, is 60 to 65%. Left ventricular ejection fraction by 2D MOD biplane is 62.8 %. Left ventricular ejection fraction by PLAX is 59 %. The left ventricle has normal function. The left ventricle has no regional wall motion abnormalities. Left ventricular diastolic parameters are indeterminate.  2. Right ventricular systolic function is low normal. The right ventricular size is mildly enlarged. Tricuspid regurgitation signal is inadequate for assessing PA pressure.  3. The mitral valve is normal in structure. Trivial mitral valve regurgitation. No evidence of mitral stenosis.  4. The aortic valve was not well visualized. Aortic valve regurgitation is mild to moderate. No aortic stenosis is present.  5. There is mild dilatation of the ascending aorta, measuring 39 mm.  6. The inferior vena cava is normal in size with greater than 50% respiratory variability, suggesting right atrial pressure of 3 mmHg. FINDINGS  Left Ventricle: Left ventricular ejection fraction, by estimation, is 60 to 65%. Left ventricular ejection fraction by PLAX is 59 %. Left ventricular ejection fraction by 2D MOD biplane is 62.8 %. The left ventricle has normal function. The left ventricle has no regional wall motion abnormalities. The left ventricular internal cavity size was normal in size. There is no left ventricular hypertrophy. Left ventricular diastolic parameters are indeterminate. Right Ventricle: The right ventricular size is mildly enlarged. Right vetricular wall thickness was not well visualized. Right ventricular systolic function is low normal. Tricuspid regurgitation signal is inadequate for assessing PA pressure. Left Atrium: Left atrial size was normal in size. Right Atrium: Right atrial size was normal in size. Pericardium: There is no evidence of pericardial effusion. Mitral Valve: The mitral valve is normal in structure. Trivial mitral valve regurgitation. No evidence of mitral valve stenosis. Tricuspid  Valve: The tricuspid valve is normal in structure. Tricuspid valve regurgitation is trivial. No evidence of tricuspid stenosis. Aortic Valve: The aortic valve was not well visualized. Aortic valve regurgitation is mild to moderate. No aortic stenosis is present. Aortic valve mean gradient measures 2.7 mmHg. Aortic valve peak  gradient measures 5.9 mmHg. Aortic valve area, by VTI measures 4.53 cm. Pulmonic Valve: The pulmonic valve was not well visualized. Pulmonic valve regurgitation is not visualized. No evidence of pulmonic stenosis. Aorta: The aortic root is normal in size and structure. There is mild dilatation of the ascending aorta, measuring 39 mm. Venous: The inferior vena cava is normal in size with greater than 50% respiratory variability, suggesting right atrial pressure of 3 mmHg. IAS/Shunts: The interatrial septum appears to be lipomatous. No atrial level shunt detected by color flow Doppler.  LEFT VENTRICLE PLAX 2D                        Biplane EF (MOD) LV EF:         Left            LV Biplane EF:   Left                ventricular                      ventricular                ejection                         ejection                fraction by                      fraction by                PLAX is 59                       2D MOD                %.                               biplane is LVIDd:         6.00 cm                          62.8 %. LVIDs:         4.10 cm LV PW:         1.00 cm         Diastology LV IVS:        0.90 cm         LV e' medial:    8.59 cm/s LVOT diam:     2.40 cm         LV E/e' medial:  10.0 LV SV:         111             LV e' lateral:   14.50 cm/s LV SV Index:   48              LV E/e' lateral: 5.9 LVOT Area:     4.52 cm  LV Volumes (MOD) LV vol d, MOD    105.0 ml A2C: LV vol d, MOD    126.0 ml A4C: LV vol s, MOD    39.4 ml A2C: LV vol s, MOD    48.0 ml A4C: LV SV MOD A2C:   65.6 ml LV SV MOD A4C:   126.0 ml LV  SV MOD BP:    77.0 ml IVC IVC diam: 2.40 cm LEFT ATRIUM              Index        RIGHT ATRIUM           Index LA diam:        4.00 cm 1.71 cm/m   RA Area:     14.50 cm LA Vol (A2C):   38.1 ml 16.32 ml/m  RA Volume:   34.10 ml  14.61 ml/m LA Vol (A4C):   36.0 ml 15.42 ml/m LA Biplane Vol: 38.2 ml 16.36 ml/m  AORTIC VALVE AV Area (Vmax):    3.55 cm AV Area (Vmean):   3.71 cm AV Area (VTI):     4.53 cm AV Vmax:           121.15 cm/s AV Vmean:          75.784 cm/s AV VTI:            0.245 m AV Peak Grad:      5.9 mmHg AV Mean Grad:      2.7 mmHg LVOT Vmax:         95.00 cm/s LVOT Vmean:        62.200 cm/s LVOT VTI:          0.246 m LVOT/AV VTI ratio: 1.00  AORTA Ao Root diam: 3.10 cm Ao Asc diam:  3.90 cm MITRAL VALVE MV Area (PHT): 4.06 cm    SHUNTS MV Decel Time: 187 msec    Systemic VTI:  0.25 m MV E velocity: 86.20 cm/s  Systemic Diam: 2.40 cm MV A velocity: 70.40 cm/s MV E/A ratio:  1.22 Dorn Ross MD Electronically signed by Dorn Ross MD Signature Date/Time: 02/15/2024/12:37:41 PM    Final    MR Angiogram Neck W or Wo Contrast Result Date: 02/14/2024 EXAM: MRA Neck Without and with contrast 02/14/2024 09:12:32 PM TECHNIQUE: Multiplanar multisequence MRA of the neck was performed without and with the administration of 10 mL gadobutrol (GADAVIST) 1 MMOL/ML injection. 2D and 3D reformatted images are provided for review. Stenosis of the internal carotid arteries is measured using NASCET criteria. The examination is degraded by motion artifact. COMPARISON: Comparison made with prior study from earlier the same day. CLINICAL HISTORY: Vertigo, central; r/o vert arter disection. FINDINGS: CAROTID ARTERIES: No dissection. No hemodynamically significant stenosis by NASCET criteria. VERTEBRAL ARTERIES: Both vertebral arteries arising from the subclavian arteries. Left vertebral artery strongly dominant, with a diffusely hypoplastic right vertebral artery. Vertebral arteries are patent with antegrade flow. No evidence for dissection or stenosis by MRA.  IMPRESSION: 1. Negative MRA of the neck. No evidence of vertebral artery dissection by MRA. Electronically signed by: Morene Hoard MD 02/14/2024 09:46 PM EDT RP Workstation: [id number]   MR Brain W and Wo Contrast Result Date: 02/14/2024 EXAM: MRI BRAIN WITH AND WITHOUT CONTRAST 02/14/2024 09:13:13 PM TECHNIQUE: Multiplanar multisequence MRI of the head/brain was performed with and without the administration of 10 mL gadobutrol (GADAVIST) 1 MMOL/ML injection. COMPARISON: Prior CT from 02/14/2024. CLINICAL HISTORY: Syncope/presyncope, cerebrovascular cause suspected; posterior circulation. FINDINGS: BRAIN AND VENTRICLES: No acute infarct. No acute intracranial hemorrhage. No mass effect or midline shift. No hydrocephalus. The sella is unremarkable. Normal flow voids. No mass or abnormal enhancement. ORBITS: No acute abnormality. SINUSES: Extensive chronic pansinusitis noted, overall worse on the left. BONES AND SOFT TISSUES: Normal bone marrow signal and enhancement. No acute soft tissue abnormality. IMPRESSION: 1. Normal brain MRI.  No acute intracranial abnormality. 2. Extensive chronic pansinusitis, worse on the left. Electronically signed by: Morene Hoard MD 02/14/2024 09:33 PM EDT RP Workstation: [id number]   CT ANGIO HEAD NECK W WO CM W PERF (CODE STROKE) LKW > 6h Result Date: 02/14/2024 EXAM: CTA Head and Neck with Perfusion 02/14/2024 05:12:35 PM TECHNIQUE: CTA of the head and neck was performed without and with the administration of 100 mL of iohexol (OMNIPAQUE) 350 MG/ML injection. 3D postprocessing with multiplanar reconstructions and MIPs was performed to evaluate the vascular anatomy. Cerebral perfusion analysis using computed tomography with contrast administration, including post-processing of parametric maps with determination of cerebral blood flow, cerebral blood volume, mean transit time and time-to-maximum. Automated exposure control, iterative reconstruction, and/or weight  based adjustment of the mA/kV was utilized to reduce the radiation dose to as low as reasonably achievable. COMPARISON: Same day CT head. CLINICAL HISTORY: Neuro deficit, acute, stroke suspected; posterior neck pain, dizziness, vomiting. Pt BIB [facility] from home. Pt had sudden onset of neck pain, room spinning dizziness, and nausea. Pt LKW was 1600. Brief neuro exam in triage shows no unilateral weakness, aphasia, dysarthria, or sensation changes. FINDINGS: CTA NECK: AORTIC ARCH AND ARCH VESSELS: No dissection or arterial injury. No significant stenosis of the brachiocephalic or subclavian arteries. CERVICAL CAROTID ARTERIES: The right carotid artery is patent from the origin to the skull base with no hemodynamically significant stenosis. The left carotid artery is patent from the origin to the skull base with no hemodynamically significant stenosis. No dissection or arterial injury. CERVICAL VERTEBRAL ARTERIES: The left vertebral artery is patent from the origin to the skull base and from its origin to the vertebrobasilar confluence. There is no evidence of dissection flap or significant irregularity of the left vertebral artery. The nondominant right vertebral artery is patent from the origin to the vertebrobasilar confluence. There is irregularity and multifocal stenosis of the V4 segment of the right vertebral artery. There is no dissection flap or significant irregularity of the extracranial right vertebral artery. LUNGS AND MEDIASTINUM: Unremarkable. SOFT TISSUES: No acute abnormality. BONES: No acute abnormality. CTA HEAD: ANTERIOR CIRCULATION: The intracranial internal carotid arteries are patent bilaterally. The anterior cerebral arteries are patent bilaterally with no significant stenosis. The middle cerebral arteries are patent bilaterally with no significant stenosis. No aneurysm. POSTERIOR CIRCULATION: Fetal origin of the left PCA. No significant stenosis of the posterior cerebral arteries. No  significant stenosis of the basilar artery. No significant stenosis of the intracranial vertebral arteries. No aneurysm. OTHER: No dural venous sinus thrombosis on this non-dedicated study. CT PERFUSION: EXAM QUALITY: Exam quality is adequate with diagnostic perfusion maps. No significant motion artifact. Appropriate arterial inflow and venous outflow curves. CORE INFARCT (CBF<30% volume): 0 mL TOTAL HYPOPERFUSION (Tmax>6s volume): 7 mL. Regenerative elevated Tmax in the anterior inferior left frontal lobe near the skull base which is favored to be artifactual. PENUMBRA: Mismatch volume: 7 mL. Mismatch ratio: Not applicable. Location: Anterior inferior left frontal lobe near the skull base. IMPRESSION: 1. No acute large vessel occlusion. 2. Diminished caliber of the V4 segment of the non-dominant right vertebral artery with irregularity and multifocal stenosis. Findings concerning for dissection. Recommend MRI brain and MRA head and neck for further evaluation. 3. No evidence of dissection in the extracranial vertebral arteries. 4. No evidence of ischemia on CT perfusion. Small region of elevated Tmax near the left anterior inferior frontal lobe favored to be artifact. 5. Findings discussed with Dr. Cleotilde at 5:44PM on 02/14/24. Electronically signed by: Donnice  Hunt MD 02/14/2024 05:45 PM EDT RP Workstation: [id number]   CT HEAD CODE STROKE WO CONTRAST (LKW 0-4.5h, LVO 0-24h) Result Date: 02/14/2024 EXAM: CT HEAD WITHOUT CONTRAST 02/14/2024 05:11:10 PM TECHNIQUE: CT of the head was performed without the administration of intravenous contrast. Automated exposure control, iterative reconstruction, and/or weight based adjustment of the mA/kV was utilized to reduce the radiation dose to as low as reasonably achievable. COMPARISON: CT head 02/20/2022. CLINICAL HISTORY: Neuro deficit, acute, stroke suspected. Pt BIB [facility] from home. Pt had sudden onset of neck pain, room spinning dizziness, and nausea. Pt LKW was  1600. Brief neuro exam in triage shows no unilateral weakness, aphasia, dysarthria, or sensation changes. FINDINGS: BRAIN AND VENTRICLES: No acute hemorrhage. No evidence of acute infarct. No hydrocephalus. No extra-axial collection. No mass effect or midline shift. [place] stroke program early CT (aspect) score: Ganglionic (caudate, ic, lentiform nucleus, insula, M1-m3): 7 Supraganglionic (m4-m6): 3 Total: 10 ORBITS: No acute abnormality. SINUSES: Mucosal thickening throughout the paranasal sinuses, complete opacification of the left frontal sinus and left maxillary sinus, and near complete opacification of the ethmoid sinuses. Findings suggestive of chronic pansinusitis. SOFT TISSUES AND SKULL: No acute soft tissue abnormality. No skull fracture. IMPRESSION: 1. No acute intracranial abnormality. 2. ASPECTS 10. 3. Chronic pansinusitis as above. 4. Findings discussed with Dr. Cleotilde at 5:17PM on 02/14/24. Electronically signed by: Donnice Mania MD 02/14/2024 05:19 PM EDT RP Workstation: [id number]        Scheduled Meds:   stroke: early stages of recovery book   Does not apply Once   aspirin  EC  81 mg Oral Daily   clopidogrel  75 mg Oral Daily   methocarbamol (ROBAXIN) injection  500 mg Intravenous Once   nicotine   14 mg Transdermal Daily   Continuous Infusions:  sodium chloride  Stopped (02/15/24 1032)          Derryl Duval, MD [facility] 02/15/2024, 3:24 PM

## 2024-03-02 ENCOUNTER — Ambulatory Visit: Admitting: Diagnostic Neuroimaging

## 2024-03-02 ENCOUNTER — Encounter: Payer: Self-pay | Admitting: Diagnostic Neuroimaging

## 2024-03-02 VITALS — BP 117/79 | HR 60 | Ht 71.0 in | Wt 258.6 lb

## 2024-03-02 DIAGNOSIS — M542 Cervicalgia: Secondary | ICD-10-CM | POA: Diagnosis not present

## 2024-03-02 DIAGNOSIS — I6501 Occlusion and stenosis of right vertebral artery: Secondary | ICD-10-CM | POA: Diagnosis not present

## 2024-03-02 NOTE — Patient Instructions (Signed)
  HYPOPLASTIC RIGHT VERTEBRAL ARTERY - complete 30 days of dual antiplatelet (aspirin  81 + clopidogrel 75) - then continue aspirin  81mg  daily alone long term - continue rosuvastatin 20mg  daily long term - repeat CTA head / neck in 3 months  LEFT POSTERIOR NECK PAIN (improving; likely musculoskeletal strain / spasm) - follow up with PCP

## 2024-03-02 NOTE — Progress Notes (Signed)
 [facility]  PATIENT: Chad Hampton DOB: [birth year]  REFERRING CLINICIAN: Mcarthur Pick, MD HISTORY FROM: patient  REASON FOR VISIT: new consult   HISTORICAL  CHIEF COMPLAINT:  Chief Complaint  Patient presents with   RM 7     Patient is here for Vertebral artery dissection -  possible blood clot on the back of his head. Causes pain no other symptoms     HISTORY OF PRESENT ILLNESS:   43 year old male with hypertension, tobacco use, prediabetes, COPD, gout, presented to the hospital for sudden onset left posterior neck pain.  This is associated with some sweating, lightheadedness and paleness.  Patient was taken to the hospital by ambulance.  CT angiogram showed decreased caliber of right vertebral artery concerning for dissection but this was not confirmed on MRA of the neck.  He was discharged on aspirin  and Plavix and recommend to follow-up in neurology clinic.  Patient continues to have some type pain sensation in his left posterior neck region.  Denies any vertigo, headache, vision changes.  No numbness or tingling.   REVIEW OF SYSTEMS: Full 14 system review of systems performed and negative with exception of: as per HPI.  ALLERGIES: Allergies  Allergen Reactions   Morphine Shortness Of Breath   Penicillins Anaphylaxis and Other (See Comments)    Pt states high severity reaction but unknown since childhood   Sulfa Antibiotics Shortness Of Breath and Other (See Comments)    Bothers my breathing   Tramadol Other (See Comments)    Seizures    Ibuprofen Rash and Dermatitis    HOME MEDICATIONS: Outpatient Medications Prior to Visit  Medication Sig Dispense Refill   albuterol  (PROVENTIL ) (2.5 MG/3ML) 0.083% nebulizer solution Take 3 mLs (2.5 mg total) by nebulization every 6 (six) hours as needed for wheezing or shortness of breath. 75 mL 4   aspirin  EC 81 MG tablet Take 1 tablet (81 mg total) by mouth daily. Swallow whole. 30 tablet 12    clopidogrel (PLAVIX) 75 MG tablet Take 1 tablet (75 mg total) by mouth daily. 30 tablet 0   cyclobenzaprine  (FLEXERIL ) 10 MG tablet Take 1 tablet (10 mg total) by mouth 2 (two) times daily as needed for muscle spasms. 20 tablet 0   methocarbamol (ROBAXIN) 500 MG tablet Take 1 tablet (500 mg total) by mouth every 8 (eight) hours as needed for muscle spasms. 14 tablet 0   rosuvastatin (CRESTOR) 20 MG tablet Take 1 tablet (20 mg total) by mouth daily. 30 tablet 0   colchicine  0.6 MG tablet Take 1 tablet (0.6 mg total) by mouth daily. (Patient not taking: Reported on 03/02/2024) 3 tablet 0   HYDROcodone -acetaminophen  (NORCO/VICODIN) 5-325 MG tablet Take 1-2 tablets by mouth every 6 (six) hours as needed for severe pain. (Patient not taking: Reported on 03/02/2024) 10 tablet 0   lidocaine  (LIDODERM ) 5 % Place 1 patch onto the skin daily. Remove & Discard patch within 12 hours or as directed by MD (Patient not taking: Reported on 03/02/2024) 30 patch 0   No facility-administered medications prior to visit.    PAST MEDICAL HISTORY: Past Medical History:  Diagnosis Date   Arthritis    Asthma    CAD (coronary artery disease)    a. nonobstructive CAD by cath in 01/2017   COPD (chronic obstructive pulmonary disease) (HCC)    Dental caries    Diabetes mellitus without complication (HCC)    GERD (gastroesophageal reflux disease)    Headache    Hypertension  Seizure disorder (HCC)     PAST SURGICAL HISTORY: Past Surgical History:  Procedure Laterality Date   CHOLECYSTECTOMY     EYE SURGERY     FACIAL RECONSTRUCTION SURGERY     LEFT HEART CATH AND CORONARY ANGIOGRAPHY N/A 02/07/2017   Procedure: LEFT HEART CATH AND CORONARY ANGIOGRAPHY;  Surgeon: Dann Candyce RAMAN, MD;  Location: [facility] INVASIVE CV LAB;  Service: Cardiovascular;  Laterality: N/A;   MULTIPLE EXTRACTIONS WITH ALVEOLOPLASTY N/A 06/06/2017   Procedure: MULTIPLE EXTRACTIONS OF TEETH NUMBERS ONE, SEVENTEEN, EIGHTEEN, NINETEEN,  TWENTY-THREE, TWENTY-FOUR, TWENTY-FIVE, TWENTY-SIX.;  Surgeon: Sheryle Hamilton, DDS;  Location: [facility] OR;  Service: Oral Surgery;  Laterality: N/A;    FAMILY HISTORY: Family History  Problem Relation Age of Onset   Diabetes Mother    Hypertension Mother    Heart failure Father    Stroke Father    Diabetes Father    Heart failure Brother     SOCIAL HISTORY: Social History   Socioeconomic History   Marital status: Widowed    Spouse name: Not on file   Number of children: Not on file   Years of education: Not on file   Highest education level: Not on file  Occupational History   Not on file  Tobacco Use   Smoking status: Every Day    Current packs/day: 1.50    Average packs/day: 1.5 packs/day for 20.0 years (30.0 ttl pk-yrs)    Types: Cigarettes   Smokeless tobacco: Former    Types: Snuff  Vaping Use   Vaping status: Former   Start date: 01/05/2014   Quit date: 04/06/2014  Substance and Sexual Activity   Alcohol use: Yes    Alcohol/week: 1.0 standard drink of alcohol    Types: 1 Cans of beer per week    Comment: occasionally   Drug use: Yes    Types: Marijuana    Comment: daily   Sexual activity: Never  Other Topics Concern   Not on file  Social History Narrative   2 cups of coffee a day, more than 2 cups of soda and energy drinks daily    Social Drivers of Health   Financial Resource Strain: Not on file  Food Insecurity: No Food Insecurity (02/14/2024)   Hunger Vital Sign    Worried About Running Out of Food in the Last Year: Never true    Ran Out of Food in the Last Year: Never true  Transportation Needs: No Transportation Needs (02/14/2024)   PRAPARE - Administrator, Civil Service (Medical): No    Lack of Transportation (Non-Medical): No  Physical Activity: Not on file  Stress: Not on file  Social Connections: Socially Isolated (02/14/2024)   Social Connection and Isolation Panel    Frequency of Communication with Friends and Family: More than  three times a week    Frequency of Social Gatherings with Friends and Family: More than three times a week    Attends Religious Services: Never    Database Administrator or Organizations: No    Attends Banker Meetings: Never    Marital Status: Widowed  Intimate Partner Violence: Not At Risk (02/14/2024)   Humiliation, Afraid, Rape, and Kick questionnaire    Fear of Current or Ex-Partner: No    Emotionally Abused: No    Physically Abused: No    Sexually Abused: No     PHYSICAL EXAM  GENERAL EXAM/CONSTITUTIONAL: Vitals:  Vitals:   03/02/24 1106  BP: 117/79  Pulse: 60  Weight:  258 lb 9.6 oz (117.3 kg)  Height: 5' 11 (1.803 m)   Body mass index is 36.07 kg/m. Wt Readings from Last 3 Encounters:  03/02/24 258 lb 9.6 oz (117.3 kg)  02/14/24 254 lb 3.1 oz (115.3 kg)  10/02/22 245 lb (111.1 kg)   Patient is in no distress; well developed, nourished and groomed SLIGHT TENDERNESS IN LEFT POSTERIOR NECK WITH HEAD ROTATION TO THE LEFT  CARDIOVASCULAR: Examination of carotid arteries is normal; no carotid bruits Regular rate and rhythm, no murmurs Examination of peripheral vascular system by observation and palpation is normal  EYES: Ophthalmoscopic exam of optic discs and posterior segments is normal; no papilledema or hemorrhages No results found.  MUSCULOSKELETAL: Gait, strength, tone, movements noted in Neurologic exam below  NEUROLOGIC: MENTAL STATUS:      No data to display         awake, alert, oriented to person, place and time recent and remote memory intact normal attention and concentration language fluent, comprehension intact, naming intact fund of knowledge appropriate  CRANIAL NERVE:  2nd - no papilledema on fundoscopic exam 2nd, 3rd, 4th, 6th - pupils equal and reactive to light, visual fields full to confrontation, extraocular muscles intact, no nystagmus 5th - facial sensation symmetric 7th - facial strength symmetric 8th -  hearing intact 9th - palate elevates symmetrically, uvula midline 11th - shoulder shrug symmetric 12th - tongue protrusion midline  MOTOR:  normal bulk and tone, full strength in the BUE, BLE  SENSORY:  normal and symmetric to light touch, temperature, vibration  COORDINATION:  finger-nose-finger, fine finger movements normal  REFLEXES:  deep tendon reflexes 1+ and symmetric  GAIT/STATION:  narrow based gait     DIAGNOSTIC DATA (LABS, IMAGING, TESTING) - I reviewed patient records, labs, notes, testing and imaging myself where available.  Lab Results  Component Value Date   WBC 13.5 (H) 02/15/2024   HGB 16.7 02/15/2024   HCT 48.6 02/15/2024   MCV 90.2 02/15/2024   PLT 287 02/15/2024      Component Value Date/Time   NA 140 02/15/2024 0307   K 3.6 02/15/2024 0307   CL 104 02/15/2024 0307   CO2 24 02/15/2024 0307   GLUCOSE 80 02/15/2024 0307   BUN 12 02/15/2024 0307   CREATININE 0.76 02/15/2024 0307   CREATININE 0.87 07/10/2015 1529   CALCIUM  9.4 02/15/2024 0307   PROT 7.7 02/15/2024 0307   ALBUMIN 4.2 02/15/2024 0307   AST 21 02/15/2024 0307   ALT 29 02/15/2024 0307   ALKPHOS 89 02/15/2024 0307   BILITOT 0.7 02/15/2024 0307   GFRNONAA >60 02/15/2024 0307   GFRAA >60 06/24/2018 0042   Lab Results  Component Value Date   CHOL 171 02/15/2024   HDL 19 (L) 02/15/2024   LDLCALC 96 02/15/2024   TRIG 278 (H) 02/15/2024   CHOLHDL 9.0 02/15/2024   Lab Results  Component Value Date   HGBA1C 5.7 (H) 02/15/2024   No results found for: VITAMINB12 No results found for: TSH   02/14/24 CTA head / neck [I reviewed images myself and agree with interpretation. -VRP]  1. No acute large vessel occlusion. 2. Diminished caliber of the V4 segment of the non-dominant right vertebral artery with irregularity and multifocal stenosis. Findings concerning for dissection. Recommend MRI brain and MRA head and neck for further evaluation. 3. No evidence of dissection in  the extracranial vertebral arteries. 4. No evidence of ischemia on CT perfusion. Small region of elevated Tmax near the left anterior inferior  frontal lobe favored to be artifact. 5. Findings discussed with Dr. Cleotilde at 5:44PM on 02/14/24.  02/14/24 MRA neck [I reviewed images myself and agree with interpretation. -VRP]  1. Negative MRA of the neck. No evidence of vertebral artery dissection by MRA.     ASSESSMENT AND PLAN  43 y.o. year old male here with:   Dx:  1. Vertebral artery narrowing, right   2. Neck pain on left side      PLAN:  HYPOPLASTIC RIGHT VERTEBRAL ARTERY - complete 30 days of dual antiplatelet (aspirin  81 + clopidogrel 75) - then continue aspirin  81mg  daily alone long term - continue rosuvastatin 20mg  daily long term - repeat CTA head / neck in 3 months  LEFT POSTERIOR NECK PAIN (improving; likely musculoskeletal strain / spasm) - follow up with PCP  Orders Placed This Encounter  Procedures   CT ANGIO HEAD W OR WO CONTRAST   CT ANGIO NECK W OR WO CONTRAST   Return for pending if symptoms worsen or fail to improve, pending test results.  I reviewed images, labs, notes, records myself. I summarized findings and reviewed with patient, for this high risk condition (possible vertebral dissection vs hypoplastic vertebral artery) requiring high complexity decision making.    EDUARD FABIENE HANLON, MD 03/02/2024, 12:05 PM Certified in Neurology, Neurophysiology and Neuroimaging  [facility] [address], [address] [place], [place] [address] [phone]

## 2024-03-03 ENCOUNTER — Telehealth: Payer: Self-pay | Admitting: Diagnostic Neuroimaging

## 2024-03-03 NOTE — Telephone Encounter (Signed)
 CTA orders sent to [facility] to schedule in Jan 2026-no auth required. [phone]

## 2024-04-21 ENCOUNTER — Telehealth: Payer: Self-pay

## 2024-04-21 NOTE — Telephone Encounter (Signed)
 Medical clearance request has been faxed to [facility]
